# Patient Record
Sex: Male | Born: 1965
Health system: Southern US, Community
[De-identification: ages and names within clinical notes are randomized; demographics above are authoritative.]

## PROBLEM LIST (undated history)

## (undated) ENCOUNTER — Ambulatory Visit (HOSPITAL_COMMUNITY): Admission: EM | Discharge status: Absent without leave | Payer: Commercial Managed Care - PPO

## (undated) DIAGNOSIS — K6389 Other specified diseases of intestine: Secondary | ICD-10-CM

## (undated) DIAGNOSIS — M109 Gout, unspecified: Secondary | ICD-10-CM

## (undated) DIAGNOSIS — G629 Polyneuropathy, unspecified: Secondary | ICD-10-CM

## (undated) DIAGNOSIS — I10 Essential (primary) hypertension: Secondary | ICD-10-CM

## (undated) DIAGNOSIS — N183 Chronic kidney disease, stage 3 unspecified: Secondary | ICD-10-CM

## (undated) DIAGNOSIS — D649 Anemia, unspecified: Secondary | ICD-10-CM

## (undated) DIAGNOSIS — J189 Pneumonia, unspecified organism: Secondary | ICD-10-CM

## (undated) DIAGNOSIS — M1990 Unspecified osteoarthritis, unspecified site: Secondary | ICD-10-CM

## (undated) DIAGNOSIS — Z8619 Personal history of other infectious and parasitic diseases: Secondary | ICD-10-CM

## (undated) HISTORY — DX: Personal history of other infectious and parasitic diseases: Z86.19

## (undated) HISTORY — PX: OTHER SURGICAL HISTORY: SHX169

## (undated) HISTORY — PX: COLONOSCOPY: SHX174

## (undated) HISTORY — DX: Polyneuropathy, unspecified: G62.9

## (undated) HISTORY — DX: Chronic kidney disease, stage 3 unspecified: N18.30

## (undated) HISTORY — DX: Chronic kidney disease, stage 3 (moderate): N18.3

## (undated) HISTORY — DX: Anemia, unspecified: D64.9

## (undated) HISTORY — DX: Essential (primary) hypertension: I10

## (undated) HISTORY — DX: Other specified diseases of intestine: K63.89

---

## 1985-10-18 HISTORY — PX: TENDON REPAIR: SHX5111

## 2001-08-06 ENCOUNTER — Emergency Department (HOSPITAL_COMMUNITY): Admission: EM | Admit: 2001-08-06 | Discharge: 2001-08-06 | Payer: Self-pay | Admitting: Emergency Medicine

## 2001-08-06 ENCOUNTER — Encounter: Payer: Self-pay | Admitting: Emergency Medicine

## 2001-08-08 ENCOUNTER — Emergency Department (HOSPITAL_COMMUNITY): Admission: EM | Admit: 2001-08-08 | Discharge: 2001-08-08 | Payer: Self-pay | Admitting: Emergency Medicine

## 2001-08-09 ENCOUNTER — Emergency Department (HOSPITAL_COMMUNITY): Admission: EM | Admit: 2001-08-09 | Discharge: 2001-08-09 | Payer: Self-pay | Admitting: Emergency Medicine

## 2001-08-31 ENCOUNTER — Encounter: Payer: Self-pay | Admitting: Emergency Medicine

## 2001-08-31 ENCOUNTER — Inpatient Hospital Stay (HOSPITAL_COMMUNITY): Admission: EM | Admit: 2001-08-31 | Discharge: 2001-09-01 | Payer: Self-pay | Admitting: Emergency Medicine

## 2001-11-19 ENCOUNTER — Inpatient Hospital Stay (HOSPITAL_COMMUNITY): Admission: EM | Admit: 2001-11-19 | Discharge: 2001-11-24 | Payer: Self-pay | Admitting: Emergency Medicine

## 2001-11-19 ENCOUNTER — Encounter (INDEPENDENT_AMBULATORY_CARE_PROVIDER_SITE_OTHER): Payer: Self-pay | Admitting: *Deleted

## 2001-11-20 ENCOUNTER — Encounter: Payer: Self-pay | Admitting: Internal Medicine

## 2001-11-21 ENCOUNTER — Encounter: Payer: Self-pay | Admitting: Internal Medicine

## 2002-01-26 ENCOUNTER — Emergency Department (HOSPITAL_COMMUNITY): Admission: EM | Admit: 2002-01-26 | Discharge: 2002-01-26 | Payer: Self-pay | Admitting: Emergency Medicine

## 2002-01-26 ENCOUNTER — Encounter: Payer: Self-pay | Admitting: Emergency Medicine

## 2002-02-14 ENCOUNTER — Emergency Department (HOSPITAL_COMMUNITY): Admission: EM | Admit: 2002-02-14 | Discharge: 2002-02-14 | Payer: Self-pay | Admitting: Emergency Medicine

## 2002-02-21 ENCOUNTER — Observation Stay (HOSPITAL_COMMUNITY): Admission: EM | Admit: 2002-02-21 | Discharge: 2002-02-22 | Payer: Self-pay | Admitting: Emergency Medicine

## 2002-03-21 ENCOUNTER — Encounter: Admission: RE | Admit: 2002-03-21 | Discharge: 2002-03-21 | Payer: Self-pay | Admitting: Internal Medicine

## 2002-05-23 ENCOUNTER — Emergency Department (HOSPITAL_COMMUNITY): Admission: EM | Admit: 2002-05-23 | Discharge: 2002-05-23 | Payer: Self-pay | Admitting: Emergency Medicine

## 2002-09-14 ENCOUNTER — Emergency Department (HOSPITAL_COMMUNITY): Admission: EM | Admit: 2002-09-14 | Discharge: 2002-09-14 | Payer: Self-pay | Admitting: Emergency Medicine

## 2002-09-14 ENCOUNTER — Encounter: Payer: Self-pay | Admitting: Emergency Medicine

## 2002-10-05 ENCOUNTER — Encounter: Payer: Self-pay | Admitting: Emergency Medicine

## 2002-10-05 ENCOUNTER — Emergency Department (HOSPITAL_COMMUNITY): Admission: EM | Admit: 2002-10-05 | Discharge: 2002-10-05 | Payer: Self-pay

## 2002-10-07 ENCOUNTER — Emergency Department (HOSPITAL_COMMUNITY): Admission: EM | Admit: 2002-10-07 | Discharge: 2002-10-07 | Payer: Self-pay | Admitting: Emergency Medicine

## 2002-11-11 ENCOUNTER — Encounter: Payer: Self-pay | Admitting: *Deleted

## 2002-11-11 ENCOUNTER — Inpatient Hospital Stay (HOSPITAL_COMMUNITY): Admission: EM | Admit: 2002-11-11 | Discharge: 2002-11-14 | Payer: Self-pay

## 2002-11-12 ENCOUNTER — Encounter: Payer: Self-pay | Admitting: *Deleted

## 2002-11-14 ENCOUNTER — Encounter (INDEPENDENT_AMBULATORY_CARE_PROVIDER_SITE_OTHER): Payer: Self-pay | Admitting: Specialist

## 2003-09-13 ENCOUNTER — Emergency Department (HOSPITAL_COMMUNITY): Admission: EM | Admit: 2003-09-13 | Discharge: 2003-09-13 | Payer: Self-pay | Admitting: *Deleted

## 2003-09-17 ENCOUNTER — Emergency Department (HOSPITAL_COMMUNITY): Admission: AD | Admit: 2003-09-17 | Discharge: 2003-09-17 | Payer: Self-pay | Admitting: Family Medicine

## 2003-09-27 ENCOUNTER — Emergency Department (HOSPITAL_COMMUNITY): Admission: EM | Admit: 2003-09-27 | Discharge: 2003-09-27 | Payer: Self-pay | Admitting: Emergency Medicine

## 2003-10-25 ENCOUNTER — Emergency Department (HOSPITAL_COMMUNITY): Admission: AD | Admit: 2003-10-25 | Discharge: 2003-10-25 | Payer: Self-pay | Admitting: Family Medicine

## 2003-10-28 ENCOUNTER — Emergency Department (HOSPITAL_COMMUNITY): Admission: AD | Admit: 2003-10-28 | Discharge: 2003-10-28 | Payer: Self-pay | Admitting: Family Medicine

## 2004-01-20 ENCOUNTER — Emergency Department (HOSPITAL_COMMUNITY): Admission: AD | Admit: 2004-01-20 | Discharge: 2004-01-20 | Payer: Self-pay | Admitting: Family Medicine

## 2004-03-22 ENCOUNTER — Emergency Department (HOSPITAL_COMMUNITY): Admission: EM | Admit: 2004-03-22 | Discharge: 2004-03-22 | Payer: Self-pay | Admitting: Emergency Medicine

## 2004-03-23 ENCOUNTER — Emergency Department (HOSPITAL_COMMUNITY): Admission: EM | Admit: 2004-03-23 | Discharge: 2004-03-23 | Payer: Self-pay | Admitting: Family Medicine

## 2004-03-26 ENCOUNTER — Emergency Department (HOSPITAL_COMMUNITY): Admission: EM | Admit: 2004-03-26 | Discharge: 2004-03-26 | Payer: Self-pay | Admitting: Family Medicine

## 2004-03-27 ENCOUNTER — Emergency Department (HOSPITAL_COMMUNITY): Admission: EM | Admit: 2004-03-27 | Discharge: 2004-03-27 | Payer: Self-pay | Admitting: Emergency Medicine

## 2004-08-03 ENCOUNTER — Encounter: Admission: RE | Admit: 2004-08-03 | Discharge: 2004-08-03 | Payer: Self-pay | Admitting: Nephrology

## 2004-08-04 ENCOUNTER — Encounter (HOSPITAL_COMMUNITY): Admission: RE | Admit: 2004-08-04 | Discharge: 2004-08-13 | Payer: Self-pay | Admitting: Nephrology

## 2004-10-06 ENCOUNTER — Emergency Department (HOSPITAL_COMMUNITY): Admission: EM | Admit: 2004-10-06 | Discharge: 2004-10-06 | Payer: Self-pay | Admitting: Emergency Medicine

## 2004-10-20 ENCOUNTER — Emergency Department (HOSPITAL_COMMUNITY): Admission: EM | Admit: 2004-10-20 | Discharge: 2004-10-20 | Payer: Self-pay | Admitting: Family Medicine

## 2004-10-22 ENCOUNTER — Emergency Department (HOSPITAL_COMMUNITY): Admission: EM | Admit: 2004-10-22 | Discharge: 2004-10-22 | Payer: Self-pay | Admitting: *Deleted

## 2004-10-27 ENCOUNTER — Emergency Department (HOSPITAL_COMMUNITY): Admission: EM | Admit: 2004-10-27 | Discharge: 2004-10-27 | Payer: Self-pay | Admitting: Family Medicine

## 2004-10-27 ENCOUNTER — Ambulatory Visit (HOSPITAL_COMMUNITY): Admission: RE | Admit: 2004-10-27 | Discharge: 2004-10-27 | Payer: Self-pay | Admitting: Family Medicine

## 2004-11-01 ENCOUNTER — Emergency Department (HOSPITAL_COMMUNITY): Admission: EM | Admit: 2004-11-01 | Discharge: 2004-11-01 | Payer: Self-pay | Admitting: Family Medicine

## 2004-11-02 ENCOUNTER — Ambulatory Visit (HOSPITAL_COMMUNITY): Admission: RE | Admit: 2004-11-02 | Discharge: 2004-11-02 | Payer: Self-pay | Admitting: Family Medicine

## 2004-12-30 ENCOUNTER — Emergency Department (HOSPITAL_COMMUNITY): Admission: EM | Admit: 2004-12-30 | Discharge: 2004-12-30 | Payer: Self-pay | Admitting: Family Medicine

## 2005-03-02 ENCOUNTER — Emergency Department (HOSPITAL_COMMUNITY): Admission: EM | Admit: 2005-03-02 | Discharge: 2005-03-02 | Payer: Self-pay | Admitting: Internal Medicine

## 2005-04-15 ENCOUNTER — Emergency Department (HOSPITAL_COMMUNITY): Admission: EM | Admit: 2005-04-15 | Discharge: 2005-04-15 | Payer: Self-pay | Admitting: Family Medicine

## 2005-04-17 ENCOUNTER — Observation Stay (HOSPITAL_COMMUNITY): Admission: EM | Admit: 2005-04-17 | Discharge: 2005-04-18 | Payer: Self-pay | Admitting: Emergency Medicine

## 2005-07-01 ENCOUNTER — Emergency Department (HOSPITAL_COMMUNITY): Admission: EM | Admit: 2005-07-01 | Discharge: 2005-07-01 | Payer: Self-pay | Admitting: Family Medicine

## 2005-09-21 ENCOUNTER — Emergency Department (HOSPITAL_COMMUNITY): Admission: EM | Admit: 2005-09-21 | Discharge: 2005-09-21 | Payer: Self-pay | Admitting: Family Medicine

## 2005-10-07 ENCOUNTER — Emergency Department (HOSPITAL_COMMUNITY): Admission: EM | Admit: 2005-10-07 | Discharge: 2005-10-07 | Payer: Self-pay | Admitting: Emergency Medicine

## 2005-10-21 ENCOUNTER — Emergency Department (HOSPITAL_COMMUNITY): Admission: EM | Admit: 2005-10-21 | Discharge: 2005-10-21 | Payer: Self-pay | Admitting: Family Medicine

## 2005-11-25 ENCOUNTER — Emergency Department (HOSPITAL_COMMUNITY): Admission: EM | Admit: 2005-11-25 | Discharge: 2005-11-25 | Payer: Self-pay | Admitting: Family Medicine

## 2005-11-27 ENCOUNTER — Emergency Department (HOSPITAL_COMMUNITY): Admission: EM | Admit: 2005-11-27 | Discharge: 2005-11-27 | Payer: Self-pay | Admitting: Family Medicine

## 2005-12-26 ENCOUNTER — Emergency Department (HOSPITAL_COMMUNITY): Admission: EM | Admit: 2005-12-26 | Discharge: 2005-12-26 | Payer: Self-pay | Admitting: Family Medicine

## 2006-01-24 ENCOUNTER — Emergency Department (HOSPITAL_COMMUNITY): Admission: EM | Admit: 2006-01-24 | Discharge: 2006-01-24 | Payer: Self-pay | Admitting: Family Medicine

## 2006-02-23 ENCOUNTER — Emergency Department (HOSPITAL_COMMUNITY): Admission: EM | Admit: 2006-02-23 | Discharge: 2006-02-23 | Payer: Self-pay | Admitting: *Deleted

## 2006-08-31 ENCOUNTER — Emergency Department (HOSPITAL_COMMUNITY): Admission: EM | Admit: 2006-08-31 | Discharge: 2006-08-31 | Payer: Self-pay | Admitting: Emergency Medicine

## 2006-10-19 ENCOUNTER — Emergency Department (HOSPITAL_COMMUNITY): Admission: EM | Admit: 2006-10-19 | Discharge: 2006-10-19 | Payer: Self-pay | Admitting: Family Medicine

## 2006-10-28 ENCOUNTER — Emergency Department (HOSPITAL_COMMUNITY): Admission: EM | Admit: 2006-10-28 | Discharge: 2006-10-28 | Payer: Self-pay | Admitting: Emergency Medicine

## 2006-10-30 ENCOUNTER — Emergency Department (HOSPITAL_COMMUNITY): Admission: EM | Admit: 2006-10-30 | Discharge: 2006-10-30 | Payer: Self-pay | Admitting: Family Medicine

## 2007-01-03 ENCOUNTER — Emergency Department (HOSPITAL_COMMUNITY): Admission: EM | Admit: 2007-01-03 | Discharge: 2007-01-03 | Payer: Self-pay | Admitting: Emergency Medicine

## 2007-01-26 ENCOUNTER — Ambulatory Visit: Payer: Self-pay | Admitting: Internal Medicine

## 2007-02-13 ENCOUNTER — Ambulatory Visit: Payer: Self-pay | Admitting: Internal Medicine

## 2007-02-13 ENCOUNTER — Encounter (INDEPENDENT_AMBULATORY_CARE_PROVIDER_SITE_OTHER): Payer: Self-pay | Admitting: *Deleted

## 2007-02-13 DIAGNOSIS — K5190 Ulcerative colitis, unspecified, without complications: Secondary | ICD-10-CM | POA: Insufficient documentation

## 2007-02-13 DIAGNOSIS — K6389 Other specified diseases of intestine: Secondary | ICD-10-CM

## 2007-06-13 ENCOUNTER — Emergency Department (HOSPITAL_COMMUNITY): Admission: EM | Admit: 2007-06-13 | Discharge: 2007-06-13 | Payer: Self-pay | Admitting: Emergency Medicine

## 2007-06-30 ENCOUNTER — Emergency Department (HOSPITAL_COMMUNITY): Admission: EM | Admit: 2007-06-30 | Discharge: 2007-06-30 | Payer: Self-pay | Admitting: Emergency Medicine

## 2007-07-02 ENCOUNTER — Emergency Department (HOSPITAL_COMMUNITY): Admission: EM | Admit: 2007-07-02 | Discharge: 2007-07-03 | Payer: Self-pay | Admitting: *Deleted

## 2007-08-10 ENCOUNTER — Encounter (INDEPENDENT_AMBULATORY_CARE_PROVIDER_SITE_OTHER): Payer: Self-pay | Admitting: Family Medicine

## 2007-08-10 ENCOUNTER — Ambulatory Visit: Payer: Self-pay | Admitting: Surgery

## 2007-08-10 ENCOUNTER — Ambulatory Visit (HOSPITAL_COMMUNITY): Admission: RE | Admit: 2007-08-10 | Discharge: 2007-08-10 | Payer: Self-pay | Admitting: Family Medicine

## 2007-09-27 ENCOUNTER — Ambulatory Visit: Payer: Self-pay | Admitting: Internal Medicine

## 2007-09-27 LAB — CONVERTED CEMR LAB
Albumin: 2.9 g/dL — ABNORMAL LOW (ref 3.5–5.2)
Alkaline Phosphatase: 39 units/L (ref 39–117)
BUN: 9 mg/dL (ref 6–23)
Basophils Absolute: 0 10*3/uL (ref 0.0–0.1)
Creatinine, Ser: 1.7 mg/dL — ABNORMAL HIGH (ref 0.4–1.5)
Eosinophils Absolute: 0.7 10*3/uL — ABNORMAL HIGH (ref 0.0–0.6)
GFR calc non Af Amer: 47 mL/min
Hemoglobin: 11.6 g/dL — ABNORMAL LOW (ref 13.0–17.0)
MCHC: 34.7 g/dL (ref 30.0–36.0)
Monocytes Absolute: 0.9 10*3/uL — ABNORMAL HIGH (ref 0.2–0.7)
Monocytes Relative: 11.9 % — ABNORMAL HIGH (ref 3.0–11.0)
Potassium: 3.8 meq/L (ref 3.5–5.1)
RBC: 3.66 M/uL — ABNORMAL LOW (ref 4.22–5.81)
RDW: 15.2 % — ABNORMAL HIGH (ref 11.5–14.6)
Total Bilirubin: 0.9 mg/dL (ref 0.3–1.2)

## 2007-10-16 DIAGNOSIS — D509 Iron deficiency anemia, unspecified: Secondary | ICD-10-CM

## 2007-10-16 DIAGNOSIS — I10 Essential (primary) hypertension: Secondary | ICD-10-CM | POA: Insufficient documentation

## 2007-12-18 ENCOUNTER — Emergency Department (HOSPITAL_COMMUNITY): Admission: EM | Admit: 2007-12-18 | Discharge: 2007-12-18 | Payer: Self-pay | Admitting: Family Medicine

## 2007-12-29 ENCOUNTER — Emergency Department (HOSPITAL_COMMUNITY): Admission: EM | Admit: 2007-12-29 | Discharge: 2007-12-29 | Payer: Self-pay | Admitting: Family Medicine

## 2008-02-13 ENCOUNTER — Emergency Department (HOSPITAL_COMMUNITY): Admission: EM | Admit: 2008-02-13 | Discharge: 2008-02-13 | Payer: Self-pay | Admitting: Emergency Medicine

## 2008-10-12 ENCOUNTER — Emergency Department (HOSPITAL_COMMUNITY): Admission: EM | Admit: 2008-10-12 | Discharge: 2008-10-12 | Payer: Self-pay | Admitting: Family Medicine

## 2008-10-13 ENCOUNTER — Emergency Department (HOSPITAL_COMMUNITY): Admission: EM | Admit: 2008-10-13 | Discharge: 2008-10-13 | Payer: Self-pay | Admitting: Family Medicine

## 2008-11-06 ENCOUNTER — Emergency Department (HOSPITAL_COMMUNITY): Admission: EM | Admit: 2008-11-06 | Discharge: 2008-11-06 | Payer: Self-pay | Admitting: Emergency Medicine

## 2008-11-17 ENCOUNTER — Emergency Department (HOSPITAL_COMMUNITY): Admission: EM | Admit: 2008-11-17 | Discharge: 2008-11-17 | Payer: Self-pay | Admitting: Family Medicine

## 2009-01-19 ENCOUNTER — Emergency Department (HOSPITAL_COMMUNITY): Admission: EM | Admit: 2009-01-19 | Discharge: 2009-01-19 | Payer: Self-pay | Admitting: Family Medicine

## 2009-02-02 ENCOUNTER — Emergency Department (HOSPITAL_COMMUNITY): Admission: EM | Admit: 2009-02-02 | Discharge: 2009-02-02 | Payer: Self-pay | Admitting: Emergency Medicine

## 2009-07-16 ENCOUNTER — Emergency Department (HOSPITAL_COMMUNITY): Admission: EM | Admit: 2009-07-16 | Discharge: 2009-07-16 | Payer: Self-pay | Admitting: Family Medicine

## 2009-07-18 ENCOUNTER — Encounter: Admission: RE | Admit: 2009-07-18 | Discharge: 2009-07-18 | Payer: Self-pay | Admitting: Family Medicine

## 2009-08-22 ENCOUNTER — Encounter (INDEPENDENT_AMBULATORY_CARE_PROVIDER_SITE_OTHER): Payer: Self-pay | Admitting: Interventional Radiology

## 2009-08-22 ENCOUNTER — Ambulatory Visit (HOSPITAL_COMMUNITY): Admission: RE | Admit: 2009-08-22 | Discharge: 2009-08-22 | Payer: Self-pay | Admitting: Nephrology

## 2010-08-22 ENCOUNTER — Emergency Department (HOSPITAL_COMMUNITY): Admission: EM | Admit: 2010-08-22 | Discharge: 2010-08-22 | Payer: Self-pay | Admitting: Family Medicine

## 2010-11-14 ENCOUNTER — Emergency Department (HOSPITAL_COMMUNITY)
Admission: EM | Admit: 2010-11-14 | Discharge: 2010-11-14 | Payer: Self-pay | Source: Home / Self Care | Admitting: Emergency Medicine

## 2010-11-14 ENCOUNTER — Emergency Department (HOSPITAL_COMMUNITY)
Admission: EM | Admit: 2010-11-14 | Discharge: 2010-11-14 | Disposition: A | Discharge status: Other Acute Inpatient Hospital | Payer: Self-pay | Source: Home / Self Care | Admitting: Emergency Medicine

## 2010-11-14 LAB — CBC
HCT: 37.1 % — ABNORMAL LOW (ref 39.0–52.0)
HCT: 49.2 % (ref 39.0–52.0)
Hemoglobin: 12.4 g/dL — ABNORMAL LOW (ref 13.0–17.0)
Hemoglobin: 16.8 g/dL (ref 13.0–17.0)
MCH: 33 pg (ref 26.0–34.0)
MCHC: 33.4 g/dL (ref 30.0–36.0)
MCHC: 34.1 g/dL (ref 30.0–36.0)
MCV: 98.7 fL (ref 78.0–100.0)
MCV: 98.2 fL (ref 78.0–100.0)
RDW: 13.4 % (ref 11.5–15.5)
RDW: 13.7 % (ref 11.5–15.5)

## 2010-11-14 LAB — DIFFERENTIAL
Basophils Absolute: 0 10*3/uL (ref 0.0–0.1)
Basophils Relative: 0 % (ref 0–1)
Eosinophils Absolute: 0.4 10*3/uL (ref 0.0–0.7)
Eosinophils Relative: 10 % — ABNORMAL HIGH (ref 0–5)
Lymphocytes Relative: 9 % — ABNORMAL LOW (ref 12–46)
Lymphs Abs: 0.8 10*3/uL (ref 0.7–4.0)
Lymphs Abs: 0.5 10*3/uL — ABNORMAL LOW (ref 0.7–4.0)
Monocytes Absolute: 0.8 10*3/uL (ref 0.1–1.0)
Monocytes Relative: 9 % (ref 3–12)
Neutro Abs: 6 10*3/uL (ref 1.7–7.7)
Neutro Abs: 3.9 10*3/uL (ref 1.7–7.7)

## 2010-11-14 LAB — POCT I-STAT, CHEM 8
BUN: 10 mg/dL (ref 6–23)
Chloride: 107 mEq/L (ref 96–112)
Creatinine, Ser: 2 mg/dL — ABNORMAL HIGH (ref 0.4–1.5)
Glucose, Bld: 94 mg/dL (ref 70–99)
Hemoglobin: 13.3 g/dL (ref 13.0–17.0)
Potassium: 3.9 mEq/L (ref 3.5–5.1)

## 2010-11-14 LAB — URINALYSIS, ROUTINE W REFLEX MICROSCOPIC
Ketones, ur: NEGATIVE mg/dL
Protein, ur: >300 mg/dL — AB
Urine Glucose, Fasting: NEGATIVE mg/dL
pH: 6 (ref 5.0–8.0)

## 2010-11-14 LAB — OCCULT BLOOD, POC DEVICE: Fecal Occult Bld: NEGATIVE

## 2010-11-14 LAB — COMPREHENSIVE METABOLIC PANEL
ALT: 17 U/L (ref 0–53)
Albumin: 2.9 g/dL — ABNORMAL LOW (ref 3.5–5.2)
Alkaline Phosphatase: 40 U/L (ref 39–117)
BUN: 10 mg/dL (ref 6–23)
Chloride: 108 mEq/L (ref 96–112)
Potassium: 4.1 mEq/L (ref 3.5–5.1)
Sodium: 139 mEq/L (ref 135–145)
Total Bilirubin: 0.8 mg/dL (ref 0.3–1.2)
Total Protein: 6.2 g/dL (ref 6.0–8.3)

## 2010-11-14 LAB — PROTIME-INR: INR: 0.98 (ref 0.00–1.49)

## 2010-11-14 LAB — APTT: aPTT: 30 seconds (ref 24–37)

## 2010-11-16 NOTE — Assessment & Plan Note (Signed)
[place]                                ON-CALL NOTE  NAME:Colin [name], Colin [name]                          MRN:          [id number] DATE:11/14/2010                            DOB:          09-Apr-1966   This is a patient of Dr. Carol Ada.  Colin [name] called.  He has constellation of GI symptoms including what sounds like minor rectal bleeding, vague abdominal pain, nausea, anorexia.  He was seen in Dr. Ulyses Amor office few days ago.  Dr. Benson Norway did not feel that this is a flare of his ulcerative colitis which he is known to have.  He thought it was more likely anorectal issue and put him on some suppositories.  The patient is calling today explaining more of these other systemic symptoms.  He has had no fevers or chills.  I really could not tell what is going on over the phone.  I recommended he present to urgent clinic or the emergency room.    Milus Banister, MD    DPJ/MedQ  DD: 11/14/2010  DT: 11/15/2010  Job #: [id number]  cc:   Tory Emerald. Benson Norway, MD

## 2011-01-20 LAB — CBC
HCT: 39 % (ref 39.0–52.0)
Hemoglobin: 13.3 g/dL (ref 13.0–17.0)
MCV: 98.1 fL (ref 78.0–100.0)
Platelets: 344 10*3/uL (ref 150–400)
RBC: 3.98 MIL/uL — ABNORMAL LOW (ref 4.22–5.81)
WBC: 9.1 10*3/uL (ref 4.0–10.5)

## 2011-01-20 LAB — APTT: aPTT: 30 seconds (ref 24–37)

## 2011-02-01 LAB — DIFFERENTIAL
Basophils Relative: 0 % (ref 0–1)
Eosinophils Absolute: 0.6 10*3/uL (ref 0.0–0.7)
Lymphs Abs: 1 10*3/uL (ref 0.7–4.0)
Neutro Abs: 8.1 10*3/uL — ABNORMAL HIGH (ref 1.7–7.7)
Neutrophils Relative %: 77 % (ref 43–77)

## 2011-02-01 LAB — POCT I-STAT, CHEM 8
BUN: 19 mg/dL (ref 6–23)
Chloride: 106 mEq/L (ref 96–112)
Creatinine, Ser: 1.9 mg/dL — ABNORMAL HIGH (ref 0.4–1.5)
Potassium: 3.6 mEq/L (ref 3.5–5.1)
Sodium: 141 mEq/L (ref 135–145)
TCO2: 27 mmol/L (ref 0–100)

## 2011-02-01 LAB — POCT CARDIAC MARKERS
CKMB, poc: <1 ng/mL — ABNORMAL LOW (ref 1.0–8.0)
Myoglobin, poc: 83.2 ng/mL (ref 12–200)

## 2011-02-01 LAB — CBC
MCV: 96.5 fL (ref 78.0–100.0)
Platelets: 333 10*3/uL (ref 150–400)
WBC: 10.6 10*3/uL — ABNORMAL HIGH (ref 4.0–10.5)

## 2011-02-05 ENCOUNTER — Ambulatory Visit: Payer: Self-pay | Admitting: Cardiovascular Disease

## 2011-02-09 ENCOUNTER — Encounter: Payer: Self-pay | Admitting: Cardiovascular Disease

## 2011-02-09 ENCOUNTER — Ambulatory Visit (INDEPENDENT_AMBULATORY_CARE_PROVIDER_SITE_OTHER): Payer: Commercial Managed Care - PPO | Admitting: Cardiovascular Disease

## 2011-02-09 VITALS — BP 146/82 | HR 70 | Ht 65.0 in | Wt 224.8 lb

## 2011-02-09 DIAGNOSIS — N189 Chronic kidney disease, unspecified: Secondary | ICD-10-CM

## 2011-02-09 DIAGNOSIS — R609 Edema, unspecified: Secondary | ICD-10-CM

## 2011-02-09 DIAGNOSIS — I10 Essential (primary) hypertension: Secondary | ICD-10-CM

## 2011-02-09 MED ORDER — ISOSORBIDE MONONITRATE ER 30 MG PO TB24: 30.0000 mg | ORAL_TABLET | Freq: Every day | ORAL | Status: DC

## 2011-02-09 NOTE — Assessment & Plan Note (Signed)
Edema is trace bilaterally and likely secondary to venous insufficiency. We have recommended leg elevation, TED hose.

## 2011-02-09 NOTE — Assessment & Plan Note (Signed)
We did discuss the various treatment options for his blood pressure appeared we have suggested that he purchase a blood pressure cuff as this will be very useful in the years to come in various options for his low pressure are limited given his underlying renal insufficiency and problems in the past with clonidine. Has an [place] American gentleman, he may respond better to nitrates and vasodilators. We have suggested he try isosorbide mononitrate 30 mg daily. If he is unable to tolerate this, we could possibly try hydralazine though this would need to be dosed frequently.   We have suggested that he stay on his current medication regimen at this time. It is uncertain if he has had a cough with ACE inhibitor. We would not want to add an ACE inhibitor in the setting of being on an ARB. We do not want to add Norvasc or costal channel blocker given that he does have lower extremity edema which is likely secondary to venous insufficiency.

## 2011-02-09 NOTE — Assessment & Plan Note (Signed)
I suspect that his renal insufficiency is secondary to chronic lung standing hypertension. I stressed to him the importance of good blood pressure control. I urged him to purchase a blood pressure cuff to help [place] with close monitoring of his pressures through the medication titration period.

## 2011-02-09 NOTE — Progress Notes (Signed)
   Patient ID: Colin [name], male    DOB: [birth year], 45 y.o.   MRN: [id number]  HPI Comments: [name] is a very pleasant 45 year old gentleman, patient of Dr.Ehinger, who presents by referral for recent problems with hypertension. He has a history of chronic renal insufficiency, ulcerative colitis dating back to 2000.  He reports that overall he feels well. He has had difficulty getting his blood pressure under control. He does not check it at home as he does not have a blood pressure cuff at this time. He did have a blood pressure cuff in the past though when he moved, his blood pressure cuff was misplaced. He does take his medications on a regular basis. He has had problems with several medications and side effects. He reports that clonidine caused dry mouth and fatigue. He does not have particular side effects with his current medications. He does take Lasix on a daily basis for his renal insufficiency and has been doing so for several years.  He is a nonsmoker, nondiabetic. Father died early from kidney related complications and hemodialysis.  EKG shows normal sinus rhythm with rate 70 beats per minute with no significant ST or T wave changes     Review of Systems  Constitutional: Negative.   HENT: Negative.   Eyes: Negative.   Respiratory: Negative.   Cardiovascular: Negative.   Gastrointestinal: Negative.   Musculoskeletal: Negative.   Skin: Negative.   Neurological: Negative.   Hematological: Negative.   Psychiatric/Behavioral: Negative.   All other systems reviewed and are negative.   BP 146/82  Pulse 70  Ht 5' 5"  (1.651 m)  Wt 224 lb 12.8 oz (101.969 kg)  BMI 37.41 kg/m2   Physical Exam  Nursing note and vitals reviewed. Constitutional: He is oriented to person, place, and time. He appears well-developed and well-nourished.       Mild obesity.  HENT:  Head: Normocephalic.  Nose: Nose normal.  Mouth/Throat: Oropharynx is clear and moist.  Eyes: Conjunctivae are  normal. Pupils are equal, round, and reactive to light.  Neck: Normal range of motion. Neck supple. No JVD present.  Cardiovascular: Normal rate, regular rhythm, S1 normal, S2 normal, normal heart sounds and intact distal pulses.  Exam reveals no gallop and no friction rub.   No murmur heard. Pulmonary/Chest: Effort normal and breath sounds normal. No respiratory distress. He has no wheezes. He has no rales. He exhibits no tenderness.  Abdominal: Soft. Bowel sounds are normal. He exhibits no distension. There is no tenderness.  Musculoskeletal: Normal range of motion. He exhibits no edema and no tenderness.  Lymphadenopathy:    He has no cervical adenopathy.  Neurological: He is alert and oriented to person, place, and time. Coordination normal.  Skin: Skin is warm and dry. No rash noted. No erythema.  Psychiatric: He has a normal mood and affect. His behavior is normal. Judgment and thought content normal.           Assessment and Plan

## 2011-02-09 NOTE — Patient Instructions (Addendum)
You are doing well. We will start imdur 30 mg daily. Please call us if you have new issues that need to be addressed before your next appt.  We will call you for a follow up Appt. In 1 month

## 2011-03-02 NOTE — Letter (Signed)
November 14, 2007    Mr. Colin [name]  [address]  [place], [place]:  [name], [name]  MRN:  [id number]  /  DOB:  [birth year]   Dear [name],   It has become clear, due to persistent medical noncompliance, that we  can no longer serve as your physicians. You have demonstrated an  inability to follow up with your medical appointments as recommended on  numerous occasions.  You were even counseled directly regarding the  issue but failed to heed this advise. Please understand that you have a  serious chronic medical condition, ulcerative colitis, which requires  regular medical supervision. Though we are available to provide care for  you for emergency situations over the next 30 days from the date of this  letter, it is your responsibility to secure a new physician immediately,  as no services will be provided thereafter.   We certainly wish you well in the future.  If you have any questions  regarding this notice, please contact our Chiropodist, Ms.  Anastasia Pall.    Sincerely,      [name]. Henrene Pastor, MD  Electronically Signed    JNP/MedQ  DD: 11/14/2007  DT: 11/15/2007  Job #: [id number]   CC:   Modena Jansky. Marisue Humble, M.D.  Anastasia Pall, GI Administrator

## 2011-03-02 NOTE — Assessment & Plan Note (Signed)
[facility] OFFICE NOTE   NAME:BELLChannon, Ambrosini                          MRN:          [id number]  DATE:09/27/2007                            DOB:          13-Jul-1966    HISTORY:  Mr. Capili called the office complaining with what sounded like  a flare of his ulcerative colitis. He was asked to come into. He was  last evaluated February 13, 2007 when he underwent colonoscopy for his  ulcerative colitis. He was found to have pancolitis. Multiple biopsies  revealed chronic active colitis. No evidence of dysplasia. He was in the  midst of a prednisone taper. He was to continue on Asacol and followup  in 4 weeks. Unfortunately the patient was noncompliant and did not  followup. He tells me that he tapered off prednisone over the course of  a month or so. In the summer, he discontinued Asacol and was feeling  well. Later in the summer, he noticed some blood in his stool and  resumed Asacol 1.6 grams t.i.d. Over the past week, he has had increased  frequency of loose stools up to 10 per day. He has seen blood and mucous  as well as cramps. No weight loss, nausea, vomiting or fevers. He states  his symptoms are quite typical of a flare.   CURRENT MEDICATIONS:  1. Asacol 1.6 grams t.i.d.  2. Furosemide unspecified dosage daily.  3. Colchicine unspecified dosage daily.   PHYSICAL EXAMINATION:  Well-appearing male in no acute distress.  Blood pressure 154/90, heart rate 88, weight 224 pounds.  HEENT:  Sclera anicteric, conjunctiva pink. Oral mucosa is intact.  LUNGS:  Clear.  HEART:  Regular.  ABDOMEN:  Soft without tenderness, mass or hernia. Good bowel sounds  heard.   IMPRESSION:  1. History of pan-ulcerative colitis. Currently with a typical flare.      This despite Asacol 4.8 grams daily for several months.  2. Medical noncompliance.   RECOMMENDATIONS:  1. Laboratories today including CBC and comprehensive metabolic  panel.  2. Continue Asacol.  3. Initiate prednisone 40 mg daily for 2 weeks and then 30 mg daily      for 2 weeks and then 20 mg daily.  4. Discussion today regarding ulcerative colitis as well as importance      of medical compliance. As well, he was referred to our website to      review ulcerative colitis in detail.  5. Strictly advised for office followup in 4 weeks. Otherwise resume      general medical care with Dr. Marisue Humble.     [name]. Henrene Pastor, MD  Electronically Signed    JNP/MedQ  DD: 09/27/2007  DT: 09/28/2007  Job #: [id number]   cc:   Modena Jansky. Marisue Humble, M.D.

## 2011-03-05 NOTE — Discharge Summary (Signed)
[place]  Patient:    Colin [name], Colin [name] Visit Number: [id number] MRN: [id number]          Service Type: MED Location: [phone] Attending Physician:  Phifer, Stefan Church Dictated by:   Thressa Sheller, M.D. Admit Date:  11/19/2001 Discharge Date: 11/24/2001   CC:         Mora Oletta Lamas, M.D.                           Discharge Summary  DISCHARGE DIAGNOSES: 1. Ulcerative colitis flare secondary to running out of medications. 2. Clostridium difficile infection. 3. Acute thrombocytopenia, possibly secondary to heparin. 4. Hypertension.  DISCHARGE MEDICATIONS: 1. Flagyl 500 mg t.i.d. x10 days. 2. Asacol 1600 mg t.i.d. 3. Prednisone taper. 4. Hydrochlorothiazide 25 mg q.d. 5. Enalapril 5 mg q.d.  FOLLOW-UP:  With Dr. Laurence Spates at [facility] in two to three weeks and follow up with Dr. Noah Delaine on February 17 at 2 p.m. in outpatient clinic.  PROCEDURES:  On February 4, a flexible sigmoidoscopy was done.  CONSULTATIONS:  James L. Oletta Lamas, M.D., GI.  HISTORY OF PRESENT ILLNESS:  Colin [name] is 45 year old African-American male with a past medical history of ulcerative colitis.  He ran out of his Asacol four days prior to admission and subsequently began to have bright red blood per rectum and lower abdominal pain.  He has also experienced mild nausea and vomiting and appetite loss.  This has happened before in November 2002.  He got prednisone at an acute care clinic two weeks ago that has not relieved the pain that he was having then.  His last colonoscopy was one year ago.  MEDICATIONS: 1. Lasix. 2. Norvasc. 3. Asacol.  ALLERGIES:  SULFA DRUGS cause nausea.  SOCIAL HISTORY:  He denies any tobacco, alcohol, or drug use.  PHYSICAL EXAMINATION:  VITAL SIGNS:  Temperature 99.5, blood pressure 172/98, pulse 96, respirations 18.  Saturation 92% on room air.  HEENT:  Normocephalic,  atraumatic.  Pupils equal, round and reactive to light and accommodation.  Extraocular movements are intact.  Oropharynx, nasopharynx without erythema or swelling.  NECK:  Supple without lymphadenopathy.  CHEST:  Clear to auscultation bilaterally.  CARDIAC:  Regular rate and rhythm, no murmurs, rubs, or gallops.  ABDOMEN:  Soft, tender on the left greater than the right.  Normoactive bowel sounds.  No rebound or guarding.  EXTREMITIES:  No edema, cyanosis, or clubbing.  ADMISSION LABORATORY DATA:  Sodium 138, potassium 3.4, chloride 105, CO2 24, BUN 8, creatinine 1.5, glucose 106, calcium 9.0.  Bilirubin 1.3, alkaline phosphatase 54, AST 22, ALT 21, protein 7.5, albumin 3.3.  WBC 11.0, hemoglobin 11.3, MCV 91, platelets 272.  Lipase 18.  Magnesium 1.6.  HOSPITAL COURSE:  #1 -  ULCERATIVE COLITIS FLARE:  Colin [name] admits that he ran out of his Asacol due to financial issues and that his ulcerative colitis got much worse as soon as he stopped taking it.  On admission he was made NPO.  He was given mesalamine per rectum once a day.  This enema was allowed to sit for eight hours each time it was given.  A GI consult was obtained to view the colon.  Flexible sigmoidoscopy was done and biopsies were taken.  Colin [name] C. difficile toxin lab came back positive while the biopsies were awaited and  at that point, oral metronidazole was started as it is difficult to visually tell the difference between ulcerative colitis and pseudomembranous colitis. Colin [name] pathology did end up showing inflammatory bowel disease with a surprising component of ischemic disease, for which we have no explanation. He was sent out on Asacol again, and Dr. Oletta Lamas will help arrange for him to get some drug samples from his office.  #2 -  CLOSTRIDIUM DIFFICILE INFECTION:  It is not certain whether this infection contributed to the problems Ms. Law was having, but we chose to continue treating the infection for a  full course of antibiotics.  He will receive two weeks worth of Flagyl.  #3 -  ACUTE THROMBOCYTOPENIA:  Colin [name] platelets dropped as low as 54,000 during his hospitalization.  It is not certain what the cause was, although heparin-associated antibodies are the most likely cause.  Mr. Mabile drugs were reviewed to see if any of them might be causing his thrombocytopenia, and it was decided that none of them were.  There was also the possibility of low-grade DIC, although he never seemed sick enough for this to be the reason.  DISCHARGE LABORATORY DATA:  WBC 20.6, hemoglobin 10.1, MCV 92, platelets 128. Sodium 141, potassium 3.4, chloride 109, CO2 26, glucose 134, BUN 14, creatinine 1.5.  Calcium 8.6. Dictated by:   Thressa Sheller, M.D. Attending Physician:  Phifer, Stefan Church DD:  01/14/02 TD:  01/15/02 Job: [id number]

## 2011-03-05 NOTE — Consult Note (Signed)
[place]  Patient:    Colin [name], Colin [name] Visit Number: [id number] MRN: [id number]          Service Type: MED Location: [phone] Attending Physician:  Phifer, Stefan Church Dictated by:   Verdell Carmine Proc. Date: 11/21/01 Admit Date:  11/19/2001   CC:         Evette Doffing, M.D.   Consultation Report  CHIEF COMPLAINT:  Abdominal pain.  ATTENDING PHYSICIAN:  Evette Doffing, M.D.  HISTORY OF PRESENT ILLNESS:  This is a 45 year old African-American male with a history of ulcerative colitis who presented to [facility] ED on February 2 with a three-day history of abdominal pain and bright red blood per rectum. He had been having frequent bouts of diarrhea for approximately a month now and had had intermittent flares of ulcerative colitis, the last one being approximately a month ago when he was seen at an urgent care center and treated with steroids.  He states that, over the past three days, he has had worsening abdominal pain associated with nausea, vomiting, subjective fevers and chills, as well as headaches.  He denies any arthralgias or skin rashes. He was first diagnosed with ulcerative colitis in [place] two to three years ago.  His last colonoscopy was in May 2002 in [place].  He had apparently been out of his medications for two to three days.  He was admitted to Dr. Wyline Mood service secondary to abdominal pain and a presumed ulcerative colitis flare.  Asacol was restarted as well as a single dose of Solu-Medrol given without relief of symptoms.  We were asked to consult for further management of care.  PAST MEDICAL HISTORY: 1. Ulcerative colitis. 2. Hypertension. 3. Pneumonia November 2002.  MEDICATIONS PRIOR TO ADMISSION: 1. Asacol 1600 mg t.i.d. to q.i.d. 2. Norvasc 10 mg p.o. q.d. 3. Lasix q.d.  PAST SURGICAL HISTORY:  None.  ALLERGIES:  SULFA causes nausea.  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  He denies any tobacco or drug use, does occasionally drink alcohol on a social basis.  FAMILY HISTORY:  No history of autoimmune diseases, no history of colon cancer, peptic ulcer disease, irritable bowel syndrome.  REVIEW OF SYSTEMS:  Prior to admission, his Review of Systems was otherwise negative except as in the HPI; however, this a.m., he complained of shortness of breath to the primary care team which he states is resolving.  PHYSICAL EXAMINATION:  VITAL SIGNS:  T-max 100.9 since admission, current temperature 98.4.  Pulse 92, respirations 20, blood pressure 162/99, O2 saturation 92% on room air.  GENERAL:  He is in moderate distress complaining of abdominal pain and nausea. He is alert and oriented x 4.  HEENT:  No scleral icterus.  Oropharynx shows moist mucous membranes.  NECK:  No JVD noted.  CARDIOVASCULAR:  Tachycardic in the 100s.  Regular rate and rhythm with no murmurs.  LUNGS:  He has faint crackles at the bases.  He does have slight increased work of breathing.  ABDOMEN:  Diffusely tender with voluntary guarding, no rebound.  Mild to moderate distention noted.  Positive bowel sounds.  RECTAL:  Heme positive on admission.  EXTREMITIES:  No cyanosis, clubbing, or edema.  He has good peripheral perfusion.  CURRENT LABORATORY DATA:  CBC shows a white count of 9.3.  Hemoglobin is 10.9, down from 11.3 on admission.  Platelets are currently 65, down from  272 on admission.  CMP on admission notable for potassium 2.4 and creatinine 1.5. His total bilirubin was 1.3, alkaline phosphatase 54, SGOT 22, SGPT 21, albumin 3.3, amylase 28, and lipase 12.  Blood cultures have been negative to date.  CT of the abdomen shows no bowel wall thickening, diffuse mesenteric fatty strands which are a nonspecific finding.  Chest x-ray from this morning is currently pending.  ASSESSMENT/PLAN:  This  is a 45 year old male with abdominal pain and bright red blood per rectum presumed to be secondary to an ulcerative colitis flare with no relief of symptoms after a single dose of Solu-Medrol and Asacol therapy.  After discussion with Dr. Oletta Lamas, will take patient down for flexible sigmoidoscopy to determine current status of patients disease.  Will consider restarting patient on daily steroid therapy based on our findings at flexible sigmoidoscopy.  Certainly, this would improve the ulcerative colitis as well as his current thrombocytopenia.  We will continue to control pain and given Phenergan for nausea.  Would make patient n.p.o. until further notice. Would monitor abdominal exam closely in the light of worsening symptoms. Dictated by:   Verdell Carmine Attending Physician:  Phifer, Stefan Church DD:  11/21/01 TD:  11/22/01 Job: [id number]

## 2011-03-05 NOTE — Discharge Summary (Signed)
[name], [name] NO.:  [id number]   MEDICAL RECORD NO.:  [id number]                   PATIENT TYPE:  INP   LOCATION:  3037                                 FACILITY:  [facility]   PHYSICIAN:  Gwendolyn Grant, M.D. [facility]          DATE OF BIRTH:  01/16/1966   DATE OF ADMISSION:  11/11/2002  DATE OF DISCHARGE:  11/14/2002                                 DISCHARGE SUMMARY   DISCHARGE DIAGNOSES:  1. Transient global amnesia.  2. Moderately active ulcerative colitis.  3. Iron deficiency anemia.  4. Chronic renal insufficiency.   BRIEF ADMISSION HISTORY:  Mr. Colin [name] is a 45 year old African-American male  who presented with amnesia.  Reportedly the patient got up around 2:15 a.m.  to prepare for work.  The wife heard him downstairs talking to himself.  She  went downstairs and found him.  He could not remember what he was supposed  to be doing.  He kept asking her the same questions over and over, for  example what day is it.  He was mildly diaphoretic but cool to the touch.  The patient denied any headache or head trauma, and no recent alcohol or  drug use.   PAST MEDICAL HISTORY:  1. Ulcerative colitis.  2. Hypertension.   HOSPITAL COURSE:  Problem 1.  Neurologic.  The patient presented with  transient global amnesia.  Head CT was negative.  The patient was admitted  for an MRI and EEG.  MRI was also unremarkable.  EEG was done today,  11/13/02.  The results are still pending, but reportedly appears normal.  His  symptoms have resolved, and no further neurologic workup is planned.  Problem 2.  GI.  The patient has a history of ulcerative colitis, and  apparently has been poorly compliant with meds in the past.  He has also  been passing blood, and is anemic.  Iron studies were consistent with iron  deficiency, and he was transfused two units.  GI was asked to see the  patient.  The patient was seen by Dr. Henrene Pastor on 11/13/02.  He suspected this  was an  active ulcerative colitis, but recommended colonoscopy with biopsy,  as well as to check stool for C. difficile.  Stool for C. difficile was  negative.  Colonoscopy was performed on 11/14/02 revealing moderately active  ulcerative colitis from the hepatic flexure to rectum.  Dr. Henrene Pastor made a  recommendation to continue the Asacol and add prednisone, as well.  The  patient is to follow up with Dr. Henrene Pastor in the office.  Problem 3.  Iron deficiency anemia.  The patient is hemodynamically stable  following two units of packed red blood cells.  Problem 4.  Hyperglycemia.  This was transient, and I suspect secondary to D-  5 and his IV fluids.  His hemoglobin A1C was normal.  Problem 5.  Renal insufficiency.  The patient is  on HCTZ at home.  This  medication was held, and his renal insufficiency improved.  He does also  have some proteinuria, and a 24-hour urine is pending.  Problem 6.  ID.  The patient was empirically started on Tequin for  bronchitis.  Will have him complete a full 10-day course.   LABS AT DISCHARGE:  Sedimentation rate was 62, retic count 2.3%.  B12 366;  complement C3, 136;  complement C4, 26; total iron less than 10.  Hemoglobin  A1C 4.4%.  Hemoglobin 8.5.  The followup hemoglobin is still pending.  BUN  was 16, creatinine 1.7.  Total cholesterol 116.    MEDICATIONS AT DISCHARGE:  1. Prednisone 40 mg daily.  2. Asacol 400 mg four pills t.i.d.  3. Iron sulfate 325 mg t.i.d.  4. Norvasc 10 mg daily.  5. HCTZ 25 mg daily.  6. Tequin 400 mg daily for 6 days.   FOLLOWUP:  With Dr. __________ in 2-3 weeks, and Dr. Scarlette Shorts on Tuesday,  11/27/02, at 1:30 p.m.     [name], P.A. [facility]                  Gwendolyn Grant, M.D. [facility]    LC/MEDQ  D:  11/14/2002  T:  11/14/2002  Job:  [id number]   cc:   [name]. [name]., M.D. [facility]  [address]  [place] [address]  Fax: 1   [name], M.D.  [address] [place] [address]  Fax:  [phone]   Dr. Marcello Fennel?

## 2011-03-05 NOTE — Consult Note (Signed)
Colin [name], TOMEO NO.:  [id number]   MEDICAL RECORD NO.:  [id number]                   PATIENT TYPE:  INP   LOCATION:  3037                                 FACILITY:  [place]   PHYSICIAN:  Heinz Knuckles. Norins, M.D. [facility]         DATE OF BIRTH:  1966-05-31   DATE OF CONSULTATION:  11/12/2002  DATE OF DISCHARGE:                                   CONSULTATION   HISTORY:  I was requested by Dr. Ashley Murrain to see and evaluate this  patient for anemia and dyspnea.   IMPRESSION:  1. Global transient amnesia, resolved.  2. Chronic anemia with a hemoglobin of 9.0 grams December, 2003 to 8.5 grams     at this evaluation.  3. History of inflammatory bowel disease by endoscopy with a known history     of poor compliance.  4. History of dyspnea.   RECOMMENDATIONS:  1. Laboratory, reticulocyte count, iron, TIBC, B12, C-ANCA, ESR, hemoglobin     A1c, 24 hour urine for creatinine clearance and total protein.  2. Tequin 400 mg q. 24 for bronchitis.  3. Transfuse two units of packed red blood cells because of symptomatic     dyspnea in a setting of anemia.   HISTORY OF PRESENT ILLNESS:  Colin [name] is a 45 year old married black male  who has a history of endoscopically diagnosed inflammatory bowel disease -  ulcerative colitis with a history of poor medication compliance in the past.  The patient has been followed for a normocytic anemia present for greater  than 12 months.  His hemoglobin was 9 grams in December, 2003 and was 8.7 at  admission on November 11, 2002, down to 8.5 on the morning of November 12, 2002.  Leading cause is probably inflammatory bowel disease with poor  control but other causes need to be evaluated.   The patient has a history of respiratory infections in the past.  At  admission on January 25 he was noted to have mild dyspnea which he now  complains of as increasing.  He also is tachypneic and has a low grade  fever.   Laboratory  review also reveals elevated serum glucose of 122 as well as  proteinuria on dipstick urinalysis.   PAST MEDICAL HISTORY:  Surgical - none.  Medical - usual childhood diseases,  history of hypertension medically managed, inflammatory bowel disease with  ulcerative colitis, anemia.   CURRENT MEDICATIONS:  1. Aspirin.  2. Norvasc 10 mg daily.  3. Hydrochlorothiazide 25 mg daily.  4. Tylenol for fever.  5. Vicodin for pain.  6. Phenergan for nausea.   FAMILY HISTORY:  Positive for hypertension, negative for coronary artery  disease, negative for colitis, negative for colon cancer.   SOCIAL HISTORY:  The patient is an [employer] veteran.  He is married.  He  has one child.  He is a Administrator.  Habits:  Tobacco - none.  Alcohol -  none.   REVIEW OF SYSTEMS:  Negative except for the history of present illness and  chief complaint.   PHYSICAL EXAMINATION:  VITALS:  Temperature maximum 101.1, blood pressure  148/80, heart rate 117, respirations 20.  GENERAL:  This is a well-nourished, well-developed black male in no acute  distress with no increased work in breathing.  HEENT:  Normocephalic, atraumatic.  Conjunctiva and sclera were clear.  Oropharynx without lesions.  NECK:  Supple, without thyromegaly.  Nodes:  No adenopathy was noted in the  cervical or supraclavicular regions.  CHEST:  The patient has good breath sounds, no rales, no wheezes are  appreciated.  CARDIOVASCULAR:  2+ radial pulse, he had a regular rate and rhythm without  murmurs, rubs or gallops.  ABDOMEN:  Soft with positive bowel sounds, no organo splenomegaly, no  guarding or rebound.  RECTAL:  Deferred.  EXTREMITIES:  Unremarkable.  NEUROLOGIC:  Per Dr. Rosiland Oz.   LABORATORY DATA:  A urinalysis on January 25 was greater than 300 mg per  deciliter of protein, 15 ketones, trace leukocyte esterase, 0 to 2 WBC's,  hyaline cast, urine epithelial cells were noted.  Urine and blood drug  screens were negative.   Alcohol level was negative.  Chemistries - on  November 11, 2002 was a sodium of 136, potassium 4.0, glucose 122, BUN 15,  creatinine 1.7.  CK was 113, troponin was 0.01.  CBC - white count of 5,400,  hemoglobin 8.7, hematocrit 27.7, platelet count 342,000.  Differential 85%  segs, 80% lymphs, 60% monos.  Follow up CBC January 26 with a hemoglobin of  8.5.  PA and lateral chest x-ray is pending, repeat C-met is pending.   ASSESSMENT:  1. ANEMIA.  Patient with longstanding normocytic anemia now worse.  The     patient does report he is having active blood in his stool and has a     history of inflammatory bowel disease/ulcerative colitis which is the     most likely cause.  Cannot rule out vasculitis or other associated     diseases as cause for his anemia.  Patient also with a history of     increasing dyspnea, possibly related to his anemia.  Plan:  Iron studies,     reticulocyte count as noted above.  Will type and cross and transfuse two     units because the patient is symptomatic with shortness of breath with     his anemia.  This was discussed with the patient.  He understands the     risks and benefits and is willing to proceed.  2. PULMONARY.  The patient with fever, tachypnea and slightly purulent     sputum.  Chest x-ray is pending.  Suspect bronchitis.  However, given the     patient's four to six week history of dyspnea need to look for other     causes including anemia, vasculitis etc.  Plan:  Will start Tequin 400 mg     daily, will obtain pulmonary function testing with pre and post     bronchodilators, will order a C-ANCA.  3. RENAL.  The patient's creatinine is 1.7, high end of normal.  He does     have significant proteinuria on dipstick urinalysis.  With concerns as     noted above for possible vasculitis or Wagner's granulomatosis will     obtain a 24 hour urine for protein and creatinine clearance, will obtain    a  C-ANCA as noted, will get complement levels.  4.  HYPERGLYCEMIA.  The patient has a serum glucose of 122.  Previous     laboratory indicated a serum glucose as high as 145.  Given the patient's     proteinuria will need to rule out any underlying occult diabetes.  Plan:     Hemoglobin A1c, no added sugar diet.  5. GI.  Patient with known inflammatory bowel disease with ulcerative     colitis.  Would recommend continuing the patient's home medications using     Asacol.   In summary, patient with medical problems as outlined above with multiple  symptoms that are clearly explained.  Of note his neurologic symptom of  global amnesia has resolved.  Would be happy to accept this patient in  transfer to our service it appears that he is neurologically stable.                                               Heinz Knuckles Norins, M.D. [facility]    MEN/MEDQ  D:  11/12/2002  T:  11/12/2002  Job:  [id number]   cc:   Michaelle Copas, M.D.  [facility]

## 2011-03-05 NOTE — Assessment & Plan Note (Signed)
[place] OFFICE NOTE   NAME:Colin [name], Colin [name]                          MRN:          [id number]  DATE:01/26/2007                            DOB:          April 23, 1966    REFERRING PHYSICIAN:  Modena Jansky. Marisue Humble, M.D.   REASON FOR CONSULTATION:  Presumed ulcerative colitis flare.   HISTORY:  This is a 45 year old African-American male with a history of  hypertension and ulcerative colitis, who is referred through the  courtesy of Dr. Marisue Humble regarding probable ulcerative colitis flare.  The patient was diagnosed with ulcerative colitis years ago at an  outside facility.  He first came to medical attention of this group in  January of 2004 when he was found to have iron deficiency anemia and  active colitis symptoms.  Colonoscopy at that time revealed moderate  ulcerative colitis involving the distal 3/4 of the colon from the  hepatic flexure to rectum.  He was placed on Asacol 1.6 g p.o. t.i.d.  and started on prednisone 40 mg daily.  He was last evaluated in this  office Mar 05, 2003, at which time he was on Asacol as a single agent,  after being weaned off of steroids.  He was asymptomatic, and provided  literature on his disease.  He was instructed to follow up in 6 months,  but failed to do so.  He acknowledges that he was aware of this  instruction, but just decided not to follow up.  At some point  thereafter, he came off of his Asacol by his own report.  He was off the  drug for about 2 years and was doing well without any significant  colitis symptoms.  He tells me he got back on the drug over the past  year to year-and-a-half.  He began to develop problems in January with  increased frequency of bowel movements, bleeding, and mucus.  Also, some  cramping abdominal discomfort.  This Friday, he had some nausea and  vomiting as well.  No fevers or weight loss.  He was seen by Dr.  Marisue Humble, who on January 23, 2007 obtained blood work.  His CBC revealed a  mild anemia with a hemoglobin of 11.4, MCV was normal at 90.2, platelets  slightly elevated at 418,000.  White blood cell count normal at 8.5.  Basic metabolic panel was unremarkable.  His creatinine was 1.5.  He is  now referred.   PAST MEDICAL HISTORY:  1. Ulcerative colitis.  2. Hypertension.   PAST SURGICAL HISTORY:  Tendon repair of the left index finger in 1987.  That is his only surgical history.   ALLERGIES:  SULFA.   CURRENT MEDICATIONS:  1. Asacol 1.6 g p.o. t.i.d.  2. Furosemide unspecified dosage daily.  3. Metoprolol unspecified dosage.  4. Emethacin unspecified dosage.   FAMILY HISTORY:  Cousin with Crohn's disease.  No colon cancer.   SOCIAL HISTORY:  The patient is married with 2 sons.  Lives with his  wife and son.  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  He does not smoke.  He has 2 to 3 bottles of wine per  month.   REVIEW OF SYSTEMS:  Per diagnostic evaluation form.   PHYSICAL EXAM:  A well-appearing male in no acute distress.  Blood pressure 144/98, heart rate 80, weight 211.2 pounds.  He is 5 feet  5 inches in height.  HEENT:  Sclerae anicteric.  Conjunctivae pink.  Oral mucosa is intact.  No adenopathy.  LUNGS:  Clear.  HEART:  Regular.  ABDOMEN:  Soft without tenderness, mass, or hernia.  RECTAL:  Deferred.  EXTREMITIES:  Without edema.   IMPRESSION:  Long-standing pan-ulcerative colitis, currently with mild  flare of disease (clinically) over the past 3 to 4 months.   RECOMMENDATIONS:  1. Continue Asacol without change.  2. Initiate prednisone 40 mg daily.  3. Multivitamin with iron.  4. Schedule colonoscopy with biopsies.  The nature of the procedure,      as well as risks, benefits, and alternatives have been reviewed.      He understood and agreed to proceed.  5. Medical compliance strictly stressed.     [name]. Henrene Pastor, MD  Electronically Signed    JNP/MedQ  DD:  01/26/2007  DT: 01/26/2007  Job #: [id number]   cc:   Modena Jansky. Marisue Humble, M.D.

## 2011-03-05 NOTE — Discharge Summary (Signed)
[place]  Patient:    Colin [name], Colin [name] Visit Number: [id number] MRN: [id number]          Service Type: Attending:  C. Milta Deiters, M.D. Dictated by:   Berkley Harvey, M.D. Proc. Date: 09/01/01 Adm. Date:  08/31/01 Disc. Date: 09/01/01   CC:         Internal Medicine Department  C. Milta Deiters, M.D.  Pricilla Riffle. Dagoberto Ligas., M.D. [facility]   Discharge Summary  Account [id number]  DISCHARGE DIAGNOSES: 1. Ulcerative colitis. 2. Questionable Clostridium difficile. 3. Hypertension. 4. Anemia. 5. Proteinuria.  CHIEF COMPLAINT:  Abdominal pain.  HISTORY OF PRESENT ILLNESS:  This is a 45 year old black male with a known history of ulcerative colitis for the last 2-3 years.  He had stopped taking his medication, Asacol, secondary to recently moving to this area, about 2 months ago.  At this time, the patient does not have a primary care, or gastroenterologist.  The patient is now presenting to the emergency department with 24-hours of severe left lower quadrant abdominal pain, nausea, vomiting, and bloody stools for the last 1-1/2 weeks.  The patient has also noticed increased bowel movements over the last 3-4 weeks having 2-3 bowel movements a day.  The patient reports that he has been hospitalized in the past for similar symptoms.  Of note, the patient was recently given a prescription for Augmentin for treatment of a pneumonia which he finished about 10 days prior to this admission.  He reports the abdominal pain is now diffuse, but still worse in the left lower quadrant.  He reports that these symptoms are worsened by eating.  He has done nothing that helps relieve this pain.  Defecation does not increase or worsen pain.  The patient is now being admitted for further workup and management.  PAST MEDICAL HISTORY: 1. Ulcerative colitis diagnosed 2-3 years ago. 2. Hypertension. 3. Recent lingular pneumonia status post Augmentin, last dose  October 22.  PAST SURGICAL HISTORY:  He has no past surgeries.  CURRENT MEDICATIONS: 1. Lasix 10 mg once a day. 2. Norvasc 10 mg once a day, 3. Asacol 400 mg 3 pills p.o. t.i.d.  ALLERGIES:  SULFA causes nausea and vomiting.  FAMILY HISTORY:  Mother has hypertension.  Father has hypertension and an MI in his 79s.  The patient has no history of GI cancer or inflammatory bowel disease in his family.  SOCIAL HISTORY:  The patient is married.  He has moved to the area.  He formerly lived in [place], [place].  He denies any tobacco use or IV drugs.  He does drink socially.  The patient is currently unemployed.  The patient is a Educational psychologist.  He has a 12th grade education.  REVIEW OF SYSTEMS:  The patient denies any fever, chills, weight change, since having pneumonia.  He does admit to a cough with productive white sputum.  PHYSICAL EXAMINATION:  VITAL SIGNS:  Temperature is 99.4, pulse is 72, respiratory rate 20, blood pressure 182/101.  99% on room air.  GENERAL:  He is an ill-appearing black male in moderate distress.  HEENT:  Normocephalic, atraumatic.  Pupils equal, round, and reactive to light and accommodation.  Extraocular muscles are intact.  Conjunctivae are pink. TMs are clear, but there are no bruits.  NECK:  Appreciate neck is supple.  LUNGS:  Clear to auscultation bilaterally.  There are no wheezes or crackles.  CARDIAC:  Regular rate and rhythm with a normal S1 and S2 heart  sounds.  There is a 2/6 systolic ejection murmur at the left sternal border.  ABDOMEN:  Diffuse tenderness.  Bowel sounds are hyperactive.  His abdomen is distended.  There is no rebound and there is no guarding.  RECTAL:  Nontender.  Normal tone.  There is a small amount of heme positive stool.  EXTREMITIES:  There is no clubbing, cyanosis, or edema.  NEUROLOGIC:  Await, alert, and oriented x4.  The exam is nonfocal.  EXTREMITIES:  No clubbing, cyanosis, or  edema.  LABORATORY DATA:  13.6 for white count, hemoglobin is 12.9; hematocrit 37.4; platelets 238.  MCV is 93.5, 88% PMNs.  Sodium 136, potassium 3.3, chloride 102, bicarb 28, BUN 12, creatinine 1.4, glucose 126, lipase 26, albumin 3.1, ESR 6.2.  UA is negative for ketones, positive proteinuria greater than 300 urine sodium is 197, wbcs 0-2, there are 2 rbcs, a few bacteria, positive for hyaline casts.  X-ray of thick walled colon with question of ______ colitis versus ulcerative colitis.  CAT scan of the abdomen revealed thickened colon walls with ______ colitis, question ulcerative colitis.  ASSESSMENT:  This is a 45 year old African-American male with a known history of ulcerative colitis who has been off his medications for several weeks with the recent use of antibiotics for a pneumonia.  He is now presenting with abdominal pain associated with bloody bowel movements, nausea, and vomiting. At this time, the differential for the patients symptoms are a flare-up of his ulcerative colitis since he is off his medications versus C. difficile secondary to antibiotic use.  It appears more likely that this would be his ulcerative colitis especially with the bloody bowel movements and being off his medications; and the symptoms being very similar to past hospitalizations for flares.  The patient has had recent hospitalization in the last year to two secondary to a failure to be able to be able to afford his medications. With regards to C. difficile, this is possible secondary to the patients use of Augmentin for pneumonia 1-2 weeks ago; however, normally C. difficile does not cause blood bowel movements.  1. GI:  As stated above the differential is between a flare of the patients     ulcerative colitis versus C. difficile colitis from antibiotic use for    pneumonia.  A GI consult was obtained from Dr. Fuller Plan, who recommended    starting patient back on his Asacol dose covering for C.  difficile with    Flagyl, and giving the patients steroids.  So the patient was restarted on    Asacol at 1600 mg 3 times a day, he was started on Solu-Medrol 80 mg once a    day, and Flagyl 250 mg 4 times a day.  The patients nausea was treated    with Phenergan.  The patient underwent CT for further evaluation which    confirmed appeared to be a flare of his ulcerative colitis.  It also showed    question of air in the bowel wall.  This could correlate with ischemia;    however, at this time the patients symptoms and exam are very benign and    make ischemia very unlikely.  The patient will need to have a follow up CAT    scan for reevaluation of these findings.  At the time of discharge the    patient has benign abdominal exam, is tolerating p.o. and is afebrile.    The patient is no longer having blood bowel movements at this time.  The    patients hematocrit has remained stable.  The patient will be discharged    on a prednisone taper over the next month.  He will continue on Flagyl for    10 days and will remain on Asacol 1200 mg three times a day. 2. Hypertension.  The patient will continue on Lasix and Norvasc.  The patient    will be followed in the outpatient clinic for further adjustment of    antihypertensive meds. 3. Proteinuria, the patient was noted to have greater than 300 mg of protein    in his urine.  The patient was started on a 24-hour urine collection for    further evaluation.  At this time, this could just be an acute reaction to    his illness.  The patient will need further evaluation as an outpatient.    The patient should have his UA repeated when he follows up in the internal    medicine outpatient clinic in 1-2 weeks. 4. Diet.  The patient should maintain a low-residue diet.  The patient is    provided with a handout on this type of diet. 5. Social.  The patient is unemployed and has found it difficult, in the past    to obtain his medications.  At this time,  we will use hospital funds to    provide patient with 3-days worth of medications.  The patient said that he    will be able to afford 1-2 weeks of medication on his own.  Have already    started the process of contacting Idelle Crouch in the internal medicine    clinic to set up drug company supplementation for his Asacol.  The patient    will call Ms. West early next week to provide appropriate information to    start this process.  FOLLOWUP:  The patient will follow up in 1-2 weeks with Dr. Rockne Menghini in the internal medicine clinic.  DISCHARGE MEDICATIONS: 1. Prednisone taper over 1 month starting at 60 mg. 2. Norvasc 10 mg once a day. 3. Lasix 10 mg once a day. 4. Asacol 1200 mg 3 times a day. 5. Flagyl 500 mg t.i.d. x10 days. Dictated by:   Berkley Harvey, M.D. Attending:  C. Milta Deiters, M.D. DD:  09/01/01 TD:  09/01/01 Job: [id number]

## 2011-03-05 NOTE — Consult Note (Signed)
NAME:  Colin [name], DOMMER NO.:  [id number]   MEDICAL RECORD NO.:  [id number]                   PATIENT TYPE:  EMS   LOCATION:  [facility]                                 FACILITY:  [place]   PHYSICIAN:  Gwendolyn Grant, M.D. [facility]          DATE OF BIRTH:  03-26-1966   DATE OF CONSULTATION:  DATE OF DISCHARGE:                                   CONSULTATION   HISTORY OF PRESENT ILLNESS:  The patient is a 45 year old African-American  male who presents with a several day history of headache, sore throat, ear  ache, stomach ache. He states that the pain was worse last evening,  prompting his emergency room evaluation. He has not had any relief with over-  the-counter Alka-Seltzer Cold and Flu or Thera-Flu.  He states he developed  a nonproductive cough last evening. He denies any fever or chills, nausea or  vomiting or diarrhea.   The patient was  evaluated by the emergency room physician, who felt that he  could be discharged home. However, the patient continued to complain that he  was  too weak to go home. Therefore we were asked to see the patient in the  emergency room. On my examination the patient was sleeping soundly. He  appeared quite comfortable after receiving morphine for his pain.   ALLERGIES:  SULFA.   MEDICATIONS:  1. Asacol.  2. Hydrochlorothiazide.   PAST MEDICAL HISTORY:  1. Ulcerative colitis diagnosed in 1999. Last colonoscopy was in February     2003.  2. Hypertension.   SOCIAL HISTORY:  He is married with one child. He is a Administrator. He has  no history of  tobacco or alcohol.   FAMILY HISTORY:  Mother living at 47 with hypertension. Father living at  67  with hypertension.   REVIEW OF SYSTEMS:  Negative for diabetes, peptic ulcer disease,  gastroesophageal reflux disease, asthma, thyroid disease or any previous  surgery. He denies any specific right upper quadrant pain, no post prandial  nausea or vomiting. He does have some  constipation. He denies any melena,  hematochezia or coffee ground emesis.   PHYSICAL EXAMINATION:  VITAL SIGNS:  Temperature 100.7, blood pressure  148/98, pulse 100, O2 saturations 94% on room air.  GENERAL:  This  is a well developed, well nourished African-American male in  no acute distress.  HEENT:  Normocephalic, atraumatic. Oropharynx clear without exudate or  erythema. Tympanic membranes were clear, pearly gray without bulging.  NECK:  Without adenopathy, JVD or carotid bruits.  LUNGS:  Clear without rales, rhonchi or wheezes.  CARDIOVASCULAR:  Regular without murmurs, rubs, gallops.  ABDOMEN:  Firm and protuberant. Bowel sounds present, Nontender. There was  no rebound or guarding.  EXTREMITIES:  Without edema or cyanosis.  NEUROLOGIC:  He was lethargic but no other focal deficits were noted.   LABORATORY DATA:  Urinalysis was negative. White count 8, hemoglobin  9,  hematocrit 37.3, with 89% neutrophils. BMET was normal except an elevated  total bilirubin of 2.1. SGOT was elevated at 77, lipase was normal at 21. An  acute abdominal series showed no obstruction and no infiltrates. Abdominal  ultrasound showed slight thickening of the gallbladder wall but no stones or  free fluid.   IMPRESSION:  1. Infectious disease. The patient has a low grade fever with a left shift.     Although we suspect this is probably a viral illness, probably an upper     respiratory infection. There is no clear evidence of any bacterial     infection at this time. Again there was no infiltrate or pneumonia noted     on his chest x-ray.  2. Gastrointestinal.  He does have a mildly elevated SGOT and total     bilirubin, although his abdominal ultrasound is unremarkable. I did     review the patient's chart from May 2003, and his liver function tests     were normal at that time. This should be further evaluated in the     outpatient setting.  3. Normocytic anemia.  His hemoglobin was 9, hematocrit  27.3. I did note     that in May 2003 his hemoglobin was 10.2 and again this should be further     evaluated as an outpatient.   PLAN:  The patient was evaluated by Dr. Asa Lente who agreed that he could be  discharged to home and he was encouraged to continue his fluid intake and  use nonsteroidals and Tylenol as needed for pain. Again she agreed with  outpatient followup with Dr. Lelon Huh regarding his anemia and elevated liver  function tests. He has also been instructed to return to the emergency  department if he develops worsening fever or chills or progressive nausea or  vomiting, diarrhea or if symptoms last greater than 14 days.     [name], P.A. [facility]                  Gwendolyn Grant, M.D. [facility]    LC/MEDQ  D:  10/05/2002  T:  10/05/2002  Job:  [id number]   cc:   Dr. Eda Paschal [facility]

## 2011-03-05 NOTE — Procedures (Signed)
. [facility]  Patient:    Colin [name], Colin [name] Visit Number: [id number] MRN: [id number]          Service Type: MED Location: [id number] Attending Physician:  Phifer, Stefan Church Dictated by:   Joyice Faster. Oletta Lamas, M.D. Proc. Date: 11/21/01 Admit Date:  11/19/2001   CC:         Evette Doffing, M.D.   Procedure Report  DATE OF BIRTH:  [birth year]  PROCEDURE:  Sigmoidoscopy and biopsy.  MEDICATIONS:  The patient received no IV conscious sedation for this procedure.  He had received some pain medications on the floor, and seemed somewhat somnolent.  SCOPE:  Pediatric Olympus video colonoscope.  INDICATIONS FOR PROCEDURE:  History of ulcerative colitis with bloody diarrhea and abdominal pain.  DESCRIPTION OF PROCEDURE:  The procedure had been explained to the patient and his wife, and consent obtained from his wife.  With the patient in the left lateral decubitus position, digital examination was performed.  The scope was inserted.  This was done in the unprepped state.  There was a large amount of liquid stool in the colon.  The mucosa was diffusely granular and friable without gross ulcerations.  We advanced up to 40 cm.  The mucosa had this appearance at least that far up.  Multiple biopsies were obtained.  Stool samples were obtained and sent for C. diff toxin.  Culture, including enterotoxin E. coli and white blood cells smear.  ASSESSMENT:  Acute colitis to 40 cm.  Endoscopic appearance most consistent with ulcerative colitis.  PLAN:  We will empirically begin IV Solu-Medrol, keep n.p.o., we will check pathology results, and stabilize his electrolytes. Dictated by:   Joyice Faster. Oletta Lamas, M.D. Attending Physician:  Phifer, Stefan Church DD:  11/21/01 TD:  11/22/01 Job: [id number]

## 2011-03-09 ENCOUNTER — Ambulatory Visit: Payer: Commercial Managed Care - PPO | Admitting: Cardiovascular Disease

## 2011-03-22 ENCOUNTER — Ambulatory Visit: Payer: Commercial Managed Care - PPO | Admitting: Cardiovascular Disease

## 2011-04-01 ENCOUNTER — Encounter: Payer: Self-pay | Admitting: Cardiovascular Disease

## 2011-04-12 ENCOUNTER — Ambulatory Visit: Payer: Commercial Managed Care - PPO | Admitting: Cardiovascular Disease

## 2011-04-22 ENCOUNTER — Ambulatory Visit: Payer: Commercial Managed Care - PPO | Admitting: Cardiovascular Disease

## 2011-04-29 ENCOUNTER — Encounter: Payer: Self-pay | Admitting: Cardiovascular Disease

## 2011-05-03 ENCOUNTER — Ambulatory Visit (INDEPENDENT_AMBULATORY_CARE_PROVIDER_SITE_OTHER): Payer: Commercial Managed Care - PPO | Admitting: Cardiovascular Disease

## 2011-05-03 ENCOUNTER — Encounter: Payer: Self-pay | Admitting: Cardiovascular Disease

## 2011-05-03 DIAGNOSIS — I10 Essential (primary) hypertension: Secondary | ICD-10-CM

## 2011-05-03 DIAGNOSIS — N189 Chronic kidney disease, unspecified: Secondary | ICD-10-CM

## 2011-05-03 DIAGNOSIS — E669 Obesity, unspecified: Secondary | ICD-10-CM

## 2011-05-03 DIAGNOSIS — R609 Edema, unspecified: Secondary | ICD-10-CM

## 2011-05-03 MED ORDER — ISOSORBIDE MONONITRATE ER 60 MG PO TB24: 60.0000 mg | ORAL_TABLET | Freq: Two times a day (BID) | ORAL | Status: DC

## 2011-05-03 NOTE — Assessment & Plan Note (Signed)
Currently on Lasix. I have asked him to minimize his fluid intake and salt intake.

## 2011-05-03 NOTE — Assessment & Plan Note (Signed)
History of chronic renal insufficiency. Also has family history of renal insufficiency. It'll be important for [place] to manage his blood pressure quickly.

## 2011-05-03 NOTE — Assessment & Plan Note (Addendum)
We will increase his isosorbide to 60 mg daily. He did not have significant headache on this medication. If his blood pressure continues to be elevated, we'll increase the isosorbide to 60 mg b.i.d. Or alternatively we could at hydralazine 50 mg t.i.d..  He had side effects on clonidine. Calcium channel blockers have not been added secondary to lower extreme edema. He is already on a diuretic. Continue losartan and atenolol.

## 2011-05-03 NOTE — Progress Notes (Signed)
Patient ID: Colin [name], male    DOB: 1966/03/12, 45 y.o.   MRN: [id number]  HPI Comments: Colin [name] is a very pleasant 45 year old gentleman, patient of Dr.Ehinger, who presents by referral for recent problems with hypertension. He has a history of chronic renal insufficiency, ulcerative colitis dating back to 2000.  He has been tolerating his medications well. His blood pressure cuff broke 3 weeks ago. He feels his pressure continues to be elevated on the isosorbide which was added on his last visit. Otherwise no other complaints. He does continue to have lower extremity edema. He does drink a significant amount of fluid.   He is a nonsmoker, nondiabetic. Father died early from kidney related complications and hemodialysis.  EKG shows normal sinus rhythm with rate 70 beats per minute with no significant ST or T wave changes    Outpatient Encounter Prescriptions as of 05/03/2011  Medication Sig Dispense Refill  . allopurinol (ZYLOPRIM) 300 MG tablet Take 300 mg by mouth daily.        Marland Kitchen atenolol (TENORMIN) 100 MG tablet Take 100 mg by mouth daily.        . colchicine 0.6 MG tablet Take 0.6 mg by mouth daily.        . furosemide (LASIX) 40 MG tablet Take 40 mg by mouth daily.        Marland Kitchen losartan (COZAAR) 100 MG tablet Take 100 mg by mouth daily.        Marland Kitchen lubiprostone (AMITIZA) 24 MCG capsule Take 24 mcg by mouth 2 (two) times daily with a meal.        .  isosorbide mononitrate (IMDUR) 30 MG 24 hr tablet Take 1 tablet (30 mg total) by mouth daily.  30 tablet  6     Review of Systems  Constitutional: Negative.   HENT: Negative.   Eyes: Negative.   Respiratory: Negative.   Cardiovascular: Positive for leg swelling.  Gastrointestinal: Negative.   Musculoskeletal: Negative.   Skin: Negative.   Neurological: Negative.   Hematological: Negative.   Psychiatric/Behavioral: Negative.   All other systems reviewed and are negative.    BP 145/110  Pulse 69  Ht 5' 3"  (1.6 m)  Wt 229 lb  (103.874 kg)  BMI 40.57 kg/m2   Physical Exam  Nursing note and vitals reviewed. Constitutional: He is oriented to person, place, and time. He appears well-developed and well-nourished.  HENT:  Head: Normocephalic.  Nose: Nose normal.  Mouth/Throat: Oropharynx is clear and moist.  Eyes: Conjunctivae are normal. Pupils are equal, round, and reactive to light.  Neck: Normal range of motion. Neck supple. No JVD present.  Cardiovascular: Normal rate, regular rhythm, S1 normal, S2 normal, normal heart sounds and intact distal pulses.  Exam reveals no gallop and no friction rub.   No murmur heard.      Trace bilateral lower extremity edema to the mid shins  Pulmonary/Chest: Effort normal and breath sounds normal. No respiratory distress. He has no wheezes. He has no rales. He exhibits no tenderness.  Abdominal: Soft. Bowel sounds are normal. He exhibits no distension. There is no tenderness.  Musculoskeletal: Normal range of motion. He exhibits no edema and no tenderness.  Lymphadenopathy:    He has no cervical adenopathy.  Neurological: He is alert and oriented to person, place, and time. Coordination normal.  Skin: Skin is warm and dry. No rash noted. No erythema.  Psychiatric: He has a normal mood and affect. His behavior is normal. Judgment and  thought content normal.           Assessment and Plan

## 2011-05-03 NOTE — Patient Instructions (Addendum)
You are doing well. Please increase the isosorbide to 60 mg a day. If your blood pressure continues to be elevated (bottom number greater than 100), then take isosorbide 60 mg twice a day Please call us if you have new issues that need to be addressed before your next appt.  We will call you for a follow up Appt. In 3 months

## 2011-12-17 ENCOUNTER — Other Ambulatory Visit: Payer: Self-pay | Admitting: Cardiovascular Disease

## 2011-12-17 ENCOUNTER — Other Ambulatory Visit: Payer: Self-pay

## 2011-12-17 DIAGNOSIS — I10 Essential (primary) hypertension: Secondary | ICD-10-CM

## 2011-12-17 MED ORDER — ISOSORBIDE MONONITRATE ER 60 MG PO TB24: 60.0000 mg | ORAL_TABLET | Freq: Two times a day (BID) | ORAL | Status: DC

## 2012-06-23 ENCOUNTER — Other Ambulatory Visit: Payer: Self-pay | Admitting: Cardiovascular Disease

## 2013-05-15 ENCOUNTER — Other Ambulatory Visit: Payer: Self-pay | Admitting: Cardiovascular Disease

## 2013-05-15 NOTE — Telephone Encounter (Signed)
LMOM for patient to call to schedule an appointment with Dr. Rockey Situ to have isosorbide refilled.

## 2013-05-15 NOTE — Telephone Encounter (Signed)
LMOM

## 2014-01-03 ENCOUNTER — Encounter (INDEPENDENT_AMBULATORY_CARE_PROVIDER_SITE_OTHER): Payer: Self-pay

## 2014-01-03 ENCOUNTER — Ambulatory Visit (INDEPENDENT_AMBULATORY_CARE_PROVIDER_SITE_OTHER): Payer: Commercial Managed Care - PPO | Admitting: Physician Assistant

## 2014-01-03 ENCOUNTER — Encounter: Payer: Self-pay | Admitting: Physician Assistant

## 2014-01-03 ENCOUNTER — Ambulatory Visit: Payer: Commercial Managed Care - PPO | Admitting: Cardiovascular Disease

## 2014-01-03 VITALS — BP 145/101 | HR 72 | Ht 64.0 in | Wt 239.5 lb

## 2014-01-03 DIAGNOSIS — R0609 Other forms of dyspnea: Secondary | ICD-10-CM

## 2014-01-03 DIAGNOSIS — R0989 Other specified symptoms and signs involving the circulatory and respiratory systems: Secondary | ICD-10-CM

## 2014-01-03 DIAGNOSIS — N189 Chronic kidney disease, unspecified: Secondary | ICD-10-CM

## 2014-01-03 DIAGNOSIS — I10 Essential (primary) hypertension: Secondary | ICD-10-CM

## 2014-01-03 DIAGNOSIS — R0600 Dyspnea, unspecified: Secondary | ICD-10-CM

## 2014-01-03 DIAGNOSIS — R609 Edema, unspecified: Secondary | ICD-10-CM

## 2014-01-03 MED ORDER — ISOSORBIDE MONONITRATE ER 60 MG PO TB24: 60.0000 mg | ORAL_TABLET | Freq: Two times a day (BID) | ORAL | Status: DC

## 2014-01-03 MED ORDER — HYDRALAZINE HCL 50 MG PO TABS: 50.0000 mg | ORAL_TABLET | Freq: Two times a day (BID) | ORAL | Status: DC

## 2014-01-03 NOTE — Progress Notes (Signed)
Patient ID: Colin [name], male   DOB: Aug 20, 1966, 48 y.o.   MRN: [id number]   Date:  01/03/2014   ID:  Colin [name], DOB August 11, 1966, MRN [id number]  PCP:  Simona Huh, MD  Primary Cardiologist:  Johnny Bridge, MD   History of Present Illness:  Colin [name] is a 48 y.o. male PMHx s/f CKD (stage III), hypertension, obesity and gout who presents today for annual follow-up.  He was last seen by Dr. Rockey Situ in 2012. No tobacco abuse or DM2. Father w/ ESRD, on hemodialysis. Passed from kidney complications. He was followed for HTN. BP was well-controlled on Losartan, Imdur and atenolol at time. He ran out of Imdur and follows up today for refills.  He reports gaining 20-30 lbs over the past several months which he attributes to eating poorly (fast food, fried foods). He does eat a lot of salt. He endorses ankle, pedal and hand edema recently. His outpatient Lasix was increased transiently with improvement. He does note reduced functional capacity and dyspnea on exertion which he attributes to his recent weight gain. He denies chest pain, PND. He takes a few minutes to catch his breath and get comfortable when he lays down. Again, he relates this to weight gain. No illicit drug use. He did use pre-workout supplements containing creatine 1 month ago. No NSAID use. He does snore, no prior OSA. No thyroid problems.   Labwork @ PCP's office 10/31/13:  Glu 106 BUN 34 Cr 2.09 GFR 34 Sodium 138 Chloride 104 CO2 31 Anion gap 8.1 Calcium 9.8  EKG: NSR, 72 bpm, isolated 17m ST depression in III, otherwise no ST/T changes  Wt Readings from Last 3 Encounters:  01/03/14 239 lb 8 oz (108.636 kg)  05/03/11 229 lb (103.874 kg)  02/09/11 224 lb 12.8 oz (101.969 kg)     Past Medical History  Diagnosis Date  . History of chicken pox   . Mumps   . [place] Hypertension   . Anemia     iron deficiency  . Melanosis coli   . CKD (chronic kidney disease) stage  3, GFR 30-59 ml/min     Current Outpatient Prescriptions  Medication Sig Dispense Refill  . allopurinol (ZYLOPRIM) 300 MG tablet Take 300 mg by mouth daily.        .Marland Kitchenatenolol (TENORMIN) 100 MG tablet Take 100 mg by mouth daily.        . colchicine 0.6 MG tablet Take 0.6 mg by mouth daily as needed.       . furosemide (LASIX) 40 MG tablet Take 40 mg by mouth daily.        . isosorbide mononitrate (IMDUR) 60 MG 24 hr tablet TAKE 1 TABLET TWICE DAILY  60 tablet  6  . losartan (COZAAR) 100 MG tablet Take 100 mg by mouth daily.         No current facility-administered medications for this visit.    Allergies:   No Known Allergies  Social History:  The patient  reports that he has never smoked. He has never used smokeless tobacco. He reports that he drinks alcohol. He reports that he does not use illicit drugs.   Family History:  Family History  Problem Relation Age of Onset  . Hypertension Father     Review of Systems: General: negative for chills, fever, night sweats or weight changes.  Cardiovascular: positive for DOE, edema, negative for chest pain, orthopnea, palpitations, paroxysmal  nocturnal dyspnea Dermatological: bilaterally leg swelling, negative for rash Respiratory: negative for cough or wheezing Urologic: negative for hematuria Abdominal: negative for nausea, vomiting, diarrhea, bright red blood per rectum, melena, or hematemesis Neurologic: negative for visual changes, syncope, or dizziness All other systems reviewed and are otherwise negative except as noted above.  PHYSICAL EXAM: VS:  BP 145/101  Pulse 72  Ht 5' 4"  (1.626 m)  Wt 239 lb 8 oz (108.636 kg)  BMI 41.09 kg/m2  Well nourished, well developed, in no acute distress HEENT: normal, PERRL Neck: no JVD or bruits Cardiac:  normal S1, S2; RRR; no murmur or gallops Lungs:  clear to auscultation bilaterally, no wheezing, rhonchi or rales Abd: soft, nontender, no hepatomegaly, normoactive BS x 4 quads Ext: 1+  bilateral pretibial edema bilaterally, cyanosis or clubbing Skin: warm and dry, cap refill < 2 sec Neuro:  CNs 2-12 intact, no focal abnormalities noted Musculoskeletal: strength and tone appropriate for age  Psych: normal affect

## 2014-01-03 NOTE — Assessment & Plan Note (Signed)
BP 145/101 today. Hold Losartan given CKD. Will start hydralazine/nitrates given demographic and good renal safety profile. Advised to monitor BP and call for symptoms of low BPs. Educated on diet, sodium restriction, exercise and weight reduction as a means of BP control and overall cardiac risk reduction. He will avoid pre-workout supplements going forward. Advised on sleep study as an OP.

## 2014-01-03 NOTE — Assessment & Plan Note (Signed)
Noted on prior notes. Venous insufficiency and high sodium intake likely contributing. Will obtain 2D echo given endorsement of DOE, orthopnea, reduced functional capacity. Again, this may be related to deconditioning from obesity.

## 2014-01-03 NOTE — Assessment & Plan Note (Signed)
CKD, stage III. Question hypertensive nephropathy. No h/o DM2. Cr 1.9-2.0 in the past (dating back to 2010). Hold ARB as above. Avoid nephrotoxic meds. Continue to monitor and follow-up with Dr. Justin Mend, his nephrologist.

## 2014-01-03 NOTE — Patient Instructions (Signed)
We will stop Losartan, start hydralazine 51m by mouth twice a day and refill Imdur 676mby mouth twice a day.   We will see you back in 6 months.  Please pursue an exercise regimen as we discussed today.  Limit salt intake at home to less than 4-5 grams per day (sodium).   Please avoid work-out supplements.   Please request a sleep study to be arranged by your primary doctor.  We will obtain an ultrasound of your heart next week.

## 2014-01-11 ENCOUNTER — Other Ambulatory Visit: Payer: Commercial Managed Care - PPO

## 2014-01-15 ENCOUNTER — Ambulatory Visit: Payer: Commercial Managed Care - PPO | Admitting: Cardiovascular Disease

## 2014-01-15 ENCOUNTER — Encounter (HOSPITAL_COMMUNITY): Payer: Self-pay | Admitting: Cardiovascular Disease

## 2014-01-15 ENCOUNTER — Other Ambulatory Visit: Payer: Commercial Managed Care - PPO

## 2014-01-15 DIAGNOSIS — R0989 Other specified symptoms and signs involving the circulatory and respiratory systems: Secondary | ICD-10-CM

## 2014-01-22 ENCOUNTER — Other Ambulatory Visit: Payer: Self-pay

## 2014-01-22 ENCOUNTER — Other Ambulatory Visit (INDEPENDENT_AMBULATORY_CARE_PROVIDER_SITE_OTHER): Payer: Commercial Managed Care - PPO

## 2014-01-22 DIAGNOSIS — R609 Edema, unspecified: Secondary | ICD-10-CM

## 2014-01-22 DIAGNOSIS — R0600 Dyspnea, unspecified: Secondary | ICD-10-CM

## 2014-05-23 ENCOUNTER — Emergency Department (HOSPITAL_COMMUNITY)
Admission: EM | Admit: 2014-05-23 | Discharge: 2014-05-23 | Disposition: A | Discharge status: Home / Self Care | Payer: Commercial Managed Care - PPO | Source: Home / Self Care | Attending: Emergency Medicine | Admitting: Emergency Medicine

## 2014-05-23 ENCOUNTER — Encounter (HOSPITAL_COMMUNITY): Payer: Self-pay | Admitting: Emergency Medicine

## 2014-05-23 DIAGNOSIS — M79609 Pain in unspecified limb: Secondary | ICD-10-CM

## 2014-05-23 DIAGNOSIS — M79671 Pain in right foot: Secondary | ICD-10-CM

## 2014-05-23 NOTE — Discharge Instructions (Signed)
I'm not sure why your heel hurts.  Rest the foot. Keep it elevated. Apply ice for 20 minutes 3 times a day. Take tylenol 1,044m 3 times a day for the next week. Use ibuprofen only if needed.  Follow up with your regular doctor if there is no improvement next week.  You may need a referral to an orthopedic doctor.

## 2014-05-23 NOTE — ED Provider Notes (Signed)
CSN: [id number]     Arrival date & time 05/23/14  1943 History   First MD Initiated Contact with Patient 05/23/14 2008     Chief Complaint  Patient presents with  . Foot Pain    pain in right heel   (Consider location/radiation/quality/duration/timing/severity/associated sxs/prior Treatment) HPI He is a 48 year old man here for evaluation of right heel pain. He states this originally occurred last week, but resolved. It then recurred today at work. It is located on the medial aspect of the heel. It is worse with weightbearing. It does not radiate. No swelling. He denies any injury. He does work on his feet, but wears supportive work boots. No new shoes.  Past Medical History  Diagnosis Date  . History of chicken pox   . Mumps   . Ulcerative colitis [place], [place]  . Hypertension   . Anemia     iron deficiency  . Melanosis coli   . CKD (chronic kidney disease) stage 3, GFR 30-59 ml/min    Past Surgical History  Procedure Laterality Date  . Tendon repair  1987    Left index finger   Family History  Problem Relation Age of Onset  . Hypertension Father    History  Substance Use Topics  . Smoking status: Never Smoker   . Smokeless tobacco: Never Used  . Alcohol Use: Yes     Comment: moderate    Review of Systems  Musculoskeletal:       Right heel pain    Allergies  Review of patient's allergies indicates no known allergies.  Home Medications   Prior to Admission medications   Medication Sig Start Date End Date Taking? Authorizing Provider  allopurinol (ZYLOPRIM) 300 MG tablet Take 300 mg by mouth daily.     Yes Historical Provider, MD  atenolol (TENORMIN) 100 MG tablet Take 100 mg by mouth daily.     Yes Historical Provider, MD  furosemide (LASIX) 40 MG tablet Take 40 mg by mouth daily.     Yes Historical Provider, MD  hydrALAZINE (APRESOLINE) 50 MG tablet Take 1 tablet (50 mg total) by mouth 2 (two) times daily. 01/03/14  Yes Roger A Arguello, PA-C    isosorbide mononitrate (IMDUR) 60 MG 24 hr tablet Take 1 tablet (60 mg total) by mouth 2 (two) times daily. 01/03/14 01/03/15 Yes Roger A Arguello, PA-C  colchicine 0.6 MG tablet Take 0.6 mg by mouth daily as needed.     Historical Provider, MD   BP 162/103  Pulse 53  Temp(Src) 97.8 F (36.6 C) (Oral)  Resp 18  SpO2 99% Physical Exam  Constitutional: He is oriented to person, place, and time. He appears well-developed and well-nourished. No distress.  Musculoskeletal:       Right foot: He exhibits tenderness (as marked in picture). He exhibits normal range of motion, no swelling and no deformity.       Feet:  5/5 strength in plantar flexion without pain.  5/5 strength in dorsiflexion but with pain.  Neurological: He is alert and oriented to person, place, and time.    ED Course  Procedures (including critical care time) Labs Review Labs Reviewed - No data to display  Imaging Review No results found.   MDM   1. Heel pain, right    Unclear etiology. Pain is not consistent with plantar fasciitis or Achilles tendinopathy. No injury that would warrant evaluation with x-ray. We'll treat conservatively with rest, ice, elevation, Tylenol. Work note provided  for tomorrow. Followup with primary care provider if no improvement next week. May require referral to orthopedics.    Melony Overly, MD 05/23/14 2049

## 2014-05-23 NOTE — ED Notes (Signed)
C/o  Right heel pain on set today.  Pain felt with weight bearing.  States same thing happened a couple weeks ago.  Hx of gout.    No relief with otc meds.  Denies any known injury.

## 2014-06-16 ENCOUNTER — Other Ambulatory Visit: Payer: Self-pay | Admitting: Physician Assistant

## 2014-08-28 ENCOUNTER — Other Ambulatory Visit: Payer: Self-pay | Admitting: Physician Assistant

## 2014-09-23 ENCOUNTER — Ambulatory Visit: Payer: Commercial Managed Care - PPO | Admitting: Cardiovascular Disease

## 2014-09-23 ENCOUNTER — Encounter: Payer: Self-pay | Admitting: *Deleted

## 2014-09-28 ENCOUNTER — Emergency Department (HOSPITAL_COMMUNITY)
Admission: EM | Admit: 2014-09-28 | Discharge: 2014-09-28 | Disposition: A | Discharge status: Home / Self Care | Payer: Commercial Managed Care - PPO | Attending: Emergency Medicine | Admitting: Emergency Medicine

## 2014-09-28 ENCOUNTER — Emergency Department (HOSPITAL_COMMUNITY): Payer: Commercial Managed Care - PPO

## 2014-09-28 ENCOUNTER — Encounter (HOSPITAL_COMMUNITY): Payer: Self-pay | Admitting: Adult Health

## 2014-09-28 DIAGNOSIS — K6289 Other specified diseases of anus and rectum: Secondary | ICD-10-CM | POA: Diagnosis present

## 2014-09-28 DIAGNOSIS — Z8719 Personal history of other diseases of the digestive system: Secondary | ICD-10-CM | POA: Diagnosis not present

## 2014-09-28 DIAGNOSIS — Z79899 Other long term (current) drug therapy: Secondary | ICD-10-CM | POA: Insufficient documentation

## 2014-09-28 DIAGNOSIS — Z862 Personal history of diseases of the blood and blood-forming organs and certain disorders involving the immune mechanism: Secondary | ICD-10-CM | POA: Diagnosis not present

## 2014-09-28 DIAGNOSIS — N183 Chronic kidney disease, stage 3 (moderate): Secondary | ICD-10-CM | POA: Diagnosis not present

## 2014-09-28 DIAGNOSIS — I129 Hypertensive chronic kidney disease with stage 1 through stage 4 chronic kidney disease, or unspecified chronic kidney disease: Secondary | ICD-10-CM | POA: Diagnosis not present

## 2014-09-28 DIAGNOSIS — Z8619 Personal history of other infectious and parasitic diseases: Secondary | ICD-10-CM | POA: Insufficient documentation

## 2014-09-28 LAB — COMPREHENSIVE METABOLIC PANEL
ALBUMIN: 2.9 g/dL — AB (ref 3.5–5.2)
ALK PHOS: 32 U/L — AB (ref 39–117)
ALT: 21 U/L (ref 0–53)
ANION GAP: 13 (ref 5–15)
AST: 60 U/L — ABNORMAL HIGH (ref 0–37)
BUN: 25 mg/dL — AB (ref 6–23)
CO2: 24 mEq/L (ref 19–32)
Calcium: 9.2 mg/dL (ref 8.4–10.5)
Chloride: 100 mEq/L (ref 96–112)
Creatinine, Ser: 1.91 mg/dL — ABNORMAL HIGH (ref 0.50–1.35)
GFR calc Af Amer: 46 mL/min — ABNORMAL LOW (ref >90)
GFR calc non Af Amer: 39 mL/min — ABNORMAL LOW (ref >90)
Glucose, Bld: 106 mg/dL — ABNORMAL HIGH (ref 70–99)
Potassium: 5.8 mEq/L — ABNORMAL HIGH (ref 3.7–5.3)
SODIUM: 137 meq/L (ref 137–147)
TOTAL PROTEIN: 7 g/dL (ref 6.0–8.3)
Total Bilirubin: 0.5 mg/dL (ref 0.3–1.2)

## 2014-09-28 LAB — CBC WITH DIFFERENTIAL/PLATELET
BASOS PCT: 0 % (ref 0–1)
Basophils Absolute: 0.1 10*3/uL (ref 0.0–0.1)
EOS ABS: 0.3 10*3/uL (ref 0.0–0.7)
EOS PCT: 3 % (ref 0–5)
HCT: 35 % — ABNORMAL LOW (ref 39.0–52.0)
Hemoglobin: 12.1 g/dL — ABNORMAL LOW (ref 13.0–17.0)
Lymphocytes Relative: 12 % (ref 12–46)
Lymphs Abs: 1.6 10*3/uL (ref 0.7–4.0)
MCH: 35.2 pg — ABNORMAL HIGH (ref 26.0–34.0)
MCHC: 34.6 g/dL (ref 30.0–36.0)
MCV: 101.7 fL — ABNORMAL HIGH (ref 78.0–100.0)
MONO ABS: 1.4 10*3/uL — AB (ref 0.1–1.0)
Monocytes Relative: 10 % (ref 3–12)
Neutro Abs: 10 10*3/uL — ABNORMAL HIGH (ref 1.7–7.7)
Neutrophils Relative %: 75 % (ref 43–77)
PLATELETS: 333 10*3/uL (ref 150–400)
RBC: 3.44 MIL/uL — ABNORMAL LOW (ref 4.22–5.81)
RDW: 14.5 % (ref 11.5–15.5)
WBC: 13.3 10*3/uL — ABNORMAL HIGH (ref 4.0–10.5)

## 2014-09-28 LAB — POTASSIUM: POTASSIUM: 4.3 meq/L (ref 3.7–5.3)

## 2014-09-28 MED ORDER — ONDANSETRON 4 MG PO TBDP: 4.0000 mg | ORAL_TABLET | Freq: Three times a day (TID) | ORAL | Status: DC | PRN

## 2014-09-28 MED ORDER — HYDROCODONE-ACETAMINOPHEN 5-325 MG PO TABS: 1.0000 | ORAL_TABLET | Freq: Four times a day (QID) | ORAL | Status: DC | PRN

## 2014-09-28 MED ORDER — METRONIDAZOLE 500 MG PO TABS: 500.0000 mg | ORAL_TABLET | Freq: Two times a day (BID) | ORAL | Status: DC

## 2014-09-28 MED ORDER — MORPHINE SULFATE 4 MG/ML IJ SOLN
4.0000 mg | Freq: Once | INTRAMUSCULAR | Status: AC
  Administered 2014-09-28: 4 mg via INTRAVENOUS
  Filled 2014-09-28: qty 1

## 2014-09-28 MED ORDER — CIPROFLOXACIN HCL 500 MG PO TABS: 500.0000 mg | ORAL_TABLET | Freq: Two times a day (BID) | ORAL | Status: DC

## 2014-09-28 MED ORDER — IOHEXOL 300 MG/ML  SOLN
80.0000 mL | Freq: Once | INTRAMUSCULAR | Status: AC | PRN
  Administered 2014-09-28: 80 mL via INTRAVENOUS

## 2014-09-28 MED ORDER — METRONIDAZOLE IN NACL 5-0.79 MG/ML-% IV SOLN
500.0000 mg | Freq: Once | INTRAVENOUS | Status: AC
  Administered 2014-09-28: 500 mg via INTRAVENOUS
  Filled 2014-09-28: qty 100

## 2014-09-28 MED ORDER — IOHEXOL 300 MG/ML  SOLN
25.0000 mL | INTRAMUSCULAR | Status: AC
  Administered 2014-09-28: 25 mL via ORAL

## 2014-09-28 MED ORDER — CIPROFLOXACIN IN D5W 400 MG/200ML IV SOLN
400.0000 mg | Freq: Once | INTRAVENOUS | Status: AC
  Administered 2014-09-28: 400 mg via INTRAVENOUS
  Filled 2014-09-28: qty 200

## 2014-09-28 MED ORDER — ONDANSETRON HCL 4 MG/2ML IJ SOLN
4.0000 mg | Freq: Once | INTRAMUSCULAR | Status: AC
  Administered 2014-09-28: 4 mg via INTRAVENOUS
  Filled 2014-09-28: qty 2

## 2014-09-28 NOTE — ED Provider Notes (Signed)
Care of the patient was assumed at signout. On several repeat exams the patient had good control of his pain, no new complains. He and I had a conversation about the CT results, the possibilities of a phlegmon versus abscess. Patient remained afebrile, hemodynamically stable. Patient was started on antibiotics, for possibility of abscess, will follow up with his gastroenterologist in 2 days via telephone. Exposer return precautions discussed.   Carmin Muskrat, MD 09/28/14 [phone]

## 2014-09-28 NOTE — ED Notes (Signed)
EDP at bedside  

## 2014-09-28 NOTE — ED Notes (Signed)
Presents with rectal pain began Wednesday night and has gotten worse, last normal BM was "quite a few days ago" placing suppositories with no relief. Rectal pain is severe, denies insertion of foreign objects into rectum. Denies drainage or leakage. Denies bleeding

## 2014-09-28 NOTE — ED Provider Notes (Signed)
CSN: [id number]     Arrival date & time 09/28/14  0416 History   First MD Initiated Contact with Patient 09/28/14 0454     Chief Complaint  Patient presents with  . Rectal Pain     (Consider location/radiation/quality/duration/timing/severity/associated sxs/prior Treatment) HPI Patient presents with 2 days of rectal pain. Difficulty having a bowel movement due to pain. Has a history of ulcerative colitis. No known rectal bleeding. No fever or chills. Discussed with his gastroenterologist who prescribed a cream. There is no change in the patient's symptoms. Past Medical History  Diagnosis Date  . History of chicken pox   . Mumps   . Ulcerative colitis [place], [place]  . Hypertension   . Anemia     iron deficiency  . Melanosis coli   . CKD (chronic kidney disease) stage 3, GFR 30-59 ml/min    Past Surgical History  Procedure Laterality Date  . Tendon repair  1987    Left index finger   Family History  Problem Relation Age of Onset  . Hypertension Father    History  Substance Use Topics  . Smoking status: Never Smoker   . Smokeless tobacco: Never Used  . Alcohol Use: Yes     Comment: moderate    Review of Systems  Constitutional: Negative for fever and chills.  Respiratory: Negative for shortness of breath.   Gastrointestinal: Positive for constipation and rectal pain. Negative for nausea, vomiting, abdominal pain, diarrhea and blood in stool.  Musculoskeletal: Negative for myalgias, back pain, neck pain and neck stiffness.  Skin: Negative for rash and wound.  Neurological: Negative for dizziness, seizures, numbness and headaches.  All other systems reviewed and are negative.     Allergies  Review of patient's allergies indicates no known allergies.  Home Medications   Prior to Admission medications   Medication Sig Start Date End Date Taking? Authorizing Provider  allopurinol (ZYLOPRIM) 300 MG tablet Take 600 mg by mouth daily.    Yes Historical  Provider, MD  atenolol (TENORMIN) 100 MG tablet Take 100 mg by mouth daily.     Yes Historical Provider, MD  colchicine 0.6 MG tablet Take 0.6 mg by mouth daily as needed (gout).    Yes Historical Provider, MD  furosemide (LASIX) 80 MG tablet Take 160 mg by mouth every evening.   Yes Historical Provider, MD  hydrALAZINE (APRESOLINE) 50 MG tablet TAKE 1 TABLET (50 MG TOTAL) BY MOUTH 2 (TWO) TIMES DAILY. Patient taking differently: Take 126m daily 08/29/14  Yes TMinna Merritts MD  isosorbide mononitrate (IMDUR) 60 MG 24 hr tablet TAKE 1 TABLET (60 MG TOTAL) BY MOUTH 2 (TWO) TIMES DAILY. Patient taking differently: Take 1262mdaily 06/17/14  Yes TiMinna MerrittsMD  nitroGLYCERIN (NITROGLYN) 2 % ointment Apply 1 inch topically 3 (three) times daily.   Yes Historical Provider, MD  pravastatin (PRAVACHOL) 40 MG tablet Take 40 mg by mouth daily.   Yes Historical Provider, MD  ciprofloxacin (CIPRO) 500 MG tablet Take 1 tablet (500 mg total) by mouth 2 (two) times daily. 09/28/14   RoCarmin MuskratMD  HYDROcodone-acetaminophen (NORCO/VICODIN) 5-325 MG per tablet Take 1 tablet by mouth every 6 (six) hours as needed for severe pain. 09/28/14   RoCarmin MuskratMD  metroNIDAZOLE (FLAGYL) 500 MG tablet Take 1 tablet (500 mg total) by mouth 2 (two) times daily. 09/28/14   RoCarmin MuskratMD  ondansetron (ZOFRAN ODT) 4 MG disintegrating tablet Take 1 tablet (4 mg total)  by mouth every 8 (eight) hours as needed for nausea or vomiting. 09/28/14   Carmin Muskrat, MD   BP 126/100 mmHg  Pulse 63  Temp(Src) 98.1 F (36.7 C) (Oral)  Resp 20  Ht 5' 5"  (1.651 m)  Wt 235 lb (106.595 kg)  BMI 39.11 kg/m2  SpO2 100% Physical Exam  Constitutional: He is oriented to person, place, and time. He appears well-developed and well-nourished. No distress.  HENT:  Head: Normocephalic and atraumatic.  Mouth/Throat: Oropharynx is clear and moist.  Eyes: EOM are normal. Pupils are equal, round, and reactive to light.   Neck: Normal range of motion. Neck supple.  Cardiovascular: Normal rate and regular rhythm.   Pulmonary/Chest: Effort normal and breath sounds normal. No respiratory distress. He has no wheezes. He has no rales.  Abdominal: Soft. Bowel sounds are normal. He exhibits distension. He exhibits no mass. There is no tenderness. There is no rebound and no guarding.  Genitourinary:  Rectal exam is with at least sensitive. There is a mass in the rectal vault. Likely hemorrhoid versus abscess. No gross blood on exam. No fissures identified.  Musculoskeletal: Normal range of motion. He exhibits no edema or tenderness.  Neurological: He is alert and oriented to person, place, and time.  Skin: Skin is warm and dry. No rash noted. No erythema.  Psychiatric: He has a normal mood and affect. His behavior is normal.  Nursing note and vitals reviewed.   ED Course  Procedures (including critical care time) Labs Review Labs Reviewed  CBC WITH DIFFERENTIAL - Abnormal; Notable for the following:    WBC 13.3 (*)    RBC 3.44 (*)    Hemoglobin 12.1 (*)    HCT 35.0 (*)    MCV 101.7 (*)    MCH 35.2 (*)    Neutro Abs 10.0 (*)    Monocytes Absolute 1.4 (*)    All other components within normal limits  COMPREHENSIVE METABOLIC PANEL - Abnormal; Notable for the following:    Potassium 5.8 (*)    Glucose, Bld 106 (*)    BUN 25 (*)    Creatinine, Ser 1.91 (*)    Albumin 2.9 (*)    AST 60 (*)    Alkaline Phosphatase 32 (*)    GFR calc non Af Amer 39 (*)    GFR calc Af Amer 46 (*)    All other components within normal limits  POTASSIUM    Imaging Review Ct Pelvis W Contrast  09/28/2014   CLINICAL DATA:  48 year old male with 2 day history of rectal pain. The clinical history of ulcerative colitis.  EXAM: CT PELVIS WITH CONTRAST  TECHNIQUE: Multidetector CT imaging of the pelvis was performed using the standard protocol following the bolus administration of intravenous contrast.  CONTRAST:  54m OMNIPAQUE  IOHEXOL 300 MG/ML  SOLN  COMPARISON:  Most recent prior CT abdomen/ pelvis 11/14/2010  FINDINGS: Pelvis: Normal appendix in the right lower quadrant. No focal bowel wall thickening or inflammatory change. No free fluid or suspicious adenopathy. The bladder, seminal vesicles and prostate gland are unremarkable.  Bones/Soft Tissues: There is a subtle 16 x 11 mm low-attenuation collection in the perianal soft tissues just posterior to the anal verge at the 6 o'clock position. No significant surrounding inflammatory change. The ischiorectal fossa a are clear. No acute fracture or aggressive appearing lytic or blastic osseous lesion.  Vascular: No significant atherosclerotic vascular disease, aneurysmal dilatation or acute abnormality.  IMPRESSION: 1. Subtle 16 x 11 mm low-attenuation focus  in the posterior perianal soft tissues at approximately the 6 o'clock position in the region of the anal verge. If this location corresponds to the palpable mass, this could conceivably represent a small perianal phlegmon or abscess. However, there is no significant surrounding inflammatory change. 2. No acute abnormality within the abdomen or pelvis. No rectal wall thickening or evidence of active inflammation.   Electronically Signed   By: Jacqulynn Cadet M.D.   On: 09/28/2014 08:09     EKG Interpretation None      MDM   Final diagnoses:  Rectal mass    Will obtain CT pelvis to rule out abscess. Discuss with oncoming emergency physician and will follow CT results.    Julianne Rice, MD 09/28/14 [phone]

## 2014-09-28 NOTE — Discharge Instructions (Signed)
As discussed, it is important that you follow up with your physician for continued management of your condition.  If you develop any new, or concerning changes in your condition, please return to the emergency department immediately.

## 2015-07-29 ENCOUNTER — Other Ambulatory Visit: Payer: Self-pay | Admitting: Cardiovascular Disease

## 2015-10-02 ENCOUNTER — Encounter: Payer: Self-pay | Admitting: Cardiovascular Disease

## 2015-10-02 ENCOUNTER — Ambulatory Visit (INDEPENDENT_AMBULATORY_CARE_PROVIDER_SITE_OTHER): Payer: Commercial Managed Care - PPO | Admitting: Cardiovascular Disease

## 2015-10-02 VITALS — BP 130/85 | HR 81 | Ht 64.5 in | Wt 240.2 lb

## 2015-10-02 DIAGNOSIS — E785 Hyperlipidemia, unspecified: Secondary | ICD-10-CM | POA: Insufficient documentation

## 2015-10-02 DIAGNOSIS — K5180 Other ulcerative colitis without complications: Secondary | ICD-10-CM | POA: Diagnosis not present

## 2015-10-02 DIAGNOSIS — R600 Localized edema: Secondary | ICD-10-CM | POA: Diagnosis not present

## 2015-10-02 DIAGNOSIS — I10 Essential (primary) hypertension: Secondary | ICD-10-CM | POA: Diagnosis not present

## 2015-10-02 DIAGNOSIS — N183 Chronic kidney disease, stage 3 unspecified: Secondary | ICD-10-CM

## 2015-10-02 MED ORDER — ISOSORBIDE MONONITRATE ER 60 MG PO TB24: 60.0000 mg | ORAL_TABLET | Freq: Every day | ORAL | Status: DC

## 2015-10-02 MED ORDER — HYDRALAZINE HCL 50 MG PO TABS: 50.0000 mg | ORAL_TABLET | Freq: Three times a day (TID) | ORAL | Status: DC | PRN

## 2015-10-02 NOTE — Assessment & Plan Note (Signed)
Strong genetic component, father with similar issues. Long-standing hypertension Discussed this with him and the need to keep his blood pressure low Avoid NSAIDs

## 2015-10-02 NOTE — Assessment & Plan Note (Signed)
He prefers to take all of his medications at nighttime Forgets to take them in the morning With this in mind, we have suggested he hold the hydralazine, start isosorbide 60 mg at night, continue atenolol 100 mg. Suggested he use hydralazine as needed for high blood pressure

## 2015-10-02 NOTE — Patient Instructions (Addendum)
You are doing well.  Please restart isosorbide one a day at night with atenolol  Please take hydralazine as needed for high blood pressure  Consider compression hose ([email or web address])  Please call us if you have new issues that need to be addressed before your next appt.  Your physician wants you to follow-up in: 12 months.  You will receive a reminder letter in the mail two months in advance. If you don't receive a letter, please call our office to schedule the follow-up appointment.

## 2015-10-02 NOTE — Assessment & Plan Note (Signed)
Dramatic rise in his total cholesterol reports compliant with his medication Recommended if his cholesterol continues to run high, could be changed to Crestor 40 mg daily, In addition, zetia 10 mg daily could be added if needed

## 2015-10-02 NOTE — Assessment & Plan Note (Signed)
He reports recent flare, given suppositories, prednisone, improvement of his symptoms

## 2015-10-02 NOTE — Assessment & Plan Note (Signed)
Leg edema likely exacerbated by his obesity/weight. Does not appear fluid overloaded. Recommended compression hose Suggested he talk to renal physician about his Lasix dosing, ideally would be better to take this twice a day rather than 160 mg all at once

## 2015-10-02 NOTE — Progress Notes (Signed)
Patient ID: Colin [name], male    DOB: Nov 10, 1965, 49 y.o.   MRN: [id number]  HPI Comments: Colin [name] is a very pleasant 49 year old gentleman, patient of Dr.Ehinger, with a history of hypertension, chronic renal failure,  ulcerative colitis dating back to 2000. Chronic leg edema He presents today for follow-up of his hypertension  In follow-up, he reports that he is doing well. He is been taking hydralazine 100 mg at night, atenolol 100 mg at night, no medications in the morning No longer takes isosorbide, ran out Leg edema is relatively well-controlled. He is been taking Lasix 160 mg at night. He does not like to take medications in the morning  Otherwise denies any new issues No chest pain or shortness of breath on exertion Lab work reviewed with him showing dramatic climb in his cholesterol up to 257 per the notes from primary care. He reports compliance with his pravastatin Prior to that, cholesterol was 176 in May 2016  EKG on today's visit shows normal sinus rhythm with rate 81 bpm, no significant ST or T-wave changes  Other past medical history He is a nonsmoker, nondiabetic. Father died early from kidney related complications and hemodialysis.    No Known Allergies  Current Outpatient Prescriptions on File Prior to Visit  Medication Sig Dispense Refill  . allopurinol (ZYLOPRIM) 300 MG tablet Take 600 mg by mouth daily.     Marland Kitchen atenolol (TENORMIN) 100 MG tablet Take 100 mg by mouth daily.      . colchicine 0.6 MG tablet Take 0.6 mg by mouth daily as needed (gout).     . furosemide (LASIX) 80 MG tablet Take 160 mg by mouth every evening.    . pravastatin (PRAVACHOL) 40 MG tablet Take 40 mg by mouth daily.     No current facility-administered medications on file prior to visit.    Past Medical History  Diagnosis Date  . History of chicken pox   . Mumps   . Ulcerative colitis [place], [place]  . Hypertension   . Anemia     iron deficiency  . Melanosis coli    . CKD (chronic kidney disease) stage 3, GFR 30-59 ml/min     Past Surgical History  Procedure Laterality Date  . Tendon repair  1987    Left index finger    Social History  reports that he has never smoked. He has never used smokeless tobacco. He reports that he drinks alcohol. He reports that he does not use illicit drugs.  Family History family history includes Hypertension in his father.   Review of Systems  Constitutional: Negative.   Respiratory: Negative.   Cardiovascular: Negative.   Gastrointestinal: Negative.   Musculoskeletal: Negative.   Neurological: Negative.   Psychiatric/Behavioral: Negative.   All other systems reviewed and are negative.   BP 130/85 mmHg  Pulse 81  Ht 5' 4.5" (1.638 m)  Wt 240 lb 4 oz (108.977 kg)  BMI 40.62 kg/m2   Physical Exam  Constitutional: He is oriented to person, place, and time. He appears well-developed and well-nourished.  HENT:  Head: Normocephalic.  Nose: Nose normal.  Mouth/Throat: Oropharynx is clear and moist.  Eyes: Conjunctivae are normal. Pupils are equal, round, and reactive to light.  Neck: Normal range of motion. Neck supple. No JVD present.  Cardiovascular: Normal rate, regular rhythm, S1 normal, S2 normal, normal heart sounds and intact distal pulses.  Exam reveals no gallop and no friction rub.   No  murmur heard. Trace edema around the ankles  Pulmonary/Chest: Effort normal and breath sounds normal. No respiratory distress. He has no wheezes. He has no rales. He exhibits no tenderness.  Abdominal: Soft. Bowel sounds are normal. He exhibits no distension. There is no tenderness.  Musculoskeletal: Normal range of motion. He exhibits no edema or tenderness.  Lymphadenopathy:    He has no cervical adenopathy.  Neurological: He is alert and oriented to person, place, and time. Coordination normal.  Skin: Skin is warm and dry. No rash noted. No erythema.  Psychiatric: He has a normal mood and affect. His  behavior is normal. Judgment and thought content normal.      Assessment and Plan   Nursing note and vitals reviewed.

## 2015-12-18 ENCOUNTER — Emergency Department (HOSPITAL_COMMUNITY)
Admission: EM | Admit: 2015-12-18 | Discharge: 2015-12-18 | Disposition: A | Discharge status: Home / Self Care | Payer: Commercial Managed Care - PPO | Source: Home / Self Care | Attending: Family Medicine | Admitting: Family Medicine

## 2015-12-18 ENCOUNTER — Encounter (HOSPITAL_COMMUNITY): Payer: Self-pay | Admitting: *Deleted

## 2015-12-18 DIAGNOSIS — S161XXA Strain of muscle, fascia and tendon at neck level, initial encounter: Secondary | ICD-10-CM

## 2015-12-18 MED ORDER — DICLOFENAC POTASSIUM 50 MG PO TABS: 50.0000 mg | ORAL_TABLET | Freq: Three times a day (TID) | ORAL | Status: DC

## 2015-12-18 MED ORDER — CYCLOBENZAPRINE HCL 5 MG PO TABS: 5.0000 mg | ORAL_TABLET | Freq: Three times a day (TID) | ORAL | Status: DC | PRN

## 2015-12-18 NOTE — Discharge Instructions (Signed)
Heat to your neck , use medicine as needed,  See physical therapy for help with muscles in neck.

## 2015-12-18 NOTE — ED Provider Notes (Signed)
CSN: [id number]     Arrival date & time 12/18/15  1853 History   First MD Initiated Contact with Patient 12/18/15 2017     Chief Complaint  Patient presents with  . Neck Pain   (Consider location/radiation/quality/duration/timing/severity/associated sxs/prior Treatment) Patient is a 50 y.o. male presenting with neck pain. The history is provided by the patient.  Neck Pain Pain location:  R side Quality:  Shooting and stiffness Pain radiates to:  Does not radiate Pain severity:  Mild Onset quality:  Sudden Progression:  Unchanged Chronicity:  Recurrent Context: lifting a heavy object and recent injury   Context comment:  Injury 2weeks ago, seen at another facility and improved but worse today with bench pressing and moving boxes, no neuro sx.   Past Medical History  Diagnosis Date  . History of chicken pox   . Mumps   . Ulcerative colitis [place], [place]  . Hypertension   . Anemia     iron deficiency  . Melanosis coli   . CKD (chronic kidney disease) stage 3, GFR 30-59 ml/min    Past Surgical History  Procedure Laterality Date  . Tendon repair  1987    Left index finger   Family History  Problem Relation Age of Onset  . Hypertension Father    Social History  Substance Use Topics  . Smoking status: Never Smoker   . Smokeless tobacco: Never Used  . Alcohol Use: Yes     Comment: moderate    Review of Systems  Constitutional: Negative.   HENT: Negative.   Musculoskeletal: Positive for neck pain. Negative for back pain, gait problem and neck stiffness.  Skin: Negative.   Neurological: Negative.   All other systems reviewed and are negative.   Allergies  Review of patient's allergies indicates no known allergies.  Home Medications   Prior to Admission medications   Medication Sig Start Date End Date Taking? Authorizing Provider  allopurinol (ZYLOPRIM) 300 MG tablet Take 600 mg by mouth daily.     Historical Provider, MD  atenolol (TENORMIN) 100 MG  tablet Take 100 mg by mouth daily.      Historical Provider, MD  colchicine 0.6 MG tablet Take 0.6 mg by mouth daily as needed (gout).     Historical Provider, MD  furosemide (LASIX) 80 MG tablet Take 160 mg by mouth every evening.    Historical Provider, MD  hydrALAZINE (APRESOLINE) 50 MG tablet Take 1 tablet (50 mg total) by mouth 3 (three) times daily as needed. 10/02/15   Minna Merritts, MD  isosorbide mononitrate (IMDUR) 60 MG 24 hr tablet Take 1 tablet (60 mg total) by mouth daily. 10/02/15   Minna Merritts, MD  pravastatin (PRAVACHOL) 40 MG tablet Take 40 mg by mouth daily.    Historical Provider, MD   Meds Ordered and Administered this Visit  Medications - No data to display  BP 147/90 mmHg  Pulse 74  Temp(Src) 98.5 F (36.9 C) (Oral)  Resp 16  SpO2 99% No data found.   Physical Exam  Constitutional: He is oriented to person, place, and time. He appears well-developed and well-nourished. No distress.  Neck: Muscular tenderness present. No spinous process tenderness present. No rigidity. Decreased range of motion present.    Musculoskeletal: He exhibits tenderness.  Lymphadenopathy:    He has no cervical adenopathy.  Neurological: He is alert and oriented to person, place, and time.  Skin: Skin is warm and dry.  Nursing note and  vitals reviewed.   ED Course  Procedures (including critical care time)  Labs Review Labs Reviewed - No data to display  Imaging Review No results found.   Visual Acuity Review  Right Eye Distance:   Left Eye Distance:   Bilateral Distance:    Right Eye Near:   Left Eye Near:    Bilateral Near:         MDM  No diagnosis found.  Meds ordered this encounter  Medications  . diclofenac (CATAFLAM) 50 MG tablet    Sig: Take 1 tablet (50 mg total) by mouth 3 (three) times daily.    Dispense:  30 tablet    Refill:  0  . cyclobenzaprine (FLEXERIL) 5 MG tablet    Sig: Take 1 tablet (5 mg total) by mouth 3 (three) times daily  as needed for muscle spasms.    Dispense:  30 tablet    Refill:  0      Billy Fischer, MD 12/18/15 2037

## 2015-12-18 NOTE — ED Notes (Addendum)
Pt  Reports  Pain  r  Shoulder   And  Neck      X   Several  Weeks        - pt    Reports  Was  Seen  sev  Weeks  Ago      And  Was  rx   Gabapentin     And  Amitriptyline          He  Reports  He  reinjured  The  r  Shoulder        Recently  Moving  Boxes     He  Reports  Pain on  Movement      Pt  Ambulated  To  Room  With a  Steady  Fluid  Gait

## 2015-12-18 NOTE — ED Notes (Signed)
Pt  Called   Stating    That     He   Cannot  Take   diclofinac    Due  To  Kidney  Issues      Pt  Spoke  On the  Phone   With   Dr   Juventino Slovak   And  He    Advised  Pt  To  Take  Tylenol

## 2016-05-21 ENCOUNTER — Other Ambulatory Visit: Payer: Self-pay | Admitting: *Deleted

## 2016-05-21 DIAGNOSIS — N185 Chronic kidney disease, stage 5: Secondary | ICD-10-CM

## 2016-05-21 DIAGNOSIS — Z01810 Encounter for preprocedural cardiovascular examination: Secondary | ICD-10-CM

## 2016-06-08 ENCOUNTER — Ambulatory Visit: Payer: Commercial Managed Care - PPO | Admitting: Vascular Surgery

## 2016-06-08 ENCOUNTER — Encounter (HOSPITAL_COMMUNITY): Payer: Commercial Managed Care - PPO

## 2016-06-08 ENCOUNTER — Other Ambulatory Visit (HOSPITAL_COMMUNITY): Payer: Commercial Managed Care - PPO

## 2016-07-09 ENCOUNTER — Encounter (HOSPITAL_COMMUNITY): Payer: Self-pay | Admitting: Family Medicine

## 2016-07-09 ENCOUNTER — Ambulatory Visit (HOSPITAL_COMMUNITY)
Admission: EM | Admit: 2016-07-09 | Discharge: 2016-07-09 | Disposition: A | Discharge status: Home / Self Care | Payer: Commercial Managed Care - PPO | Attending: Family Medicine | Admitting: Family Medicine

## 2016-07-09 DIAGNOSIS — S161XXA Strain of muscle, fascia and tendon at neck level, initial encounter: Secondary | ICD-10-CM

## 2016-07-09 DIAGNOSIS — M436 Torticollis: Secondary | ICD-10-CM

## 2016-07-09 MED ORDER — PREDNISONE 20 MG PO TABS: ORAL_TABLET | ORAL | 0 refills | Status: DC

## 2016-07-09 MED ORDER — CYCLOBENZAPRINE HCL 5 MG PO TABS: 5.0000 mg | ORAL_TABLET | Freq: Three times a day (TID) | ORAL | 0 refills | Status: DC | PRN

## 2016-07-09 NOTE — ED Provider Notes (Signed)
[facility]    CSN: [id number] Arrival date & time: 07/09/16  3646  First Provider Contact:  First MD Initiated Contact with Patient 07/09/16 2003        History   Chief Complaint Chief Complaint  Patient presents with  . Neck Pain    HPI Colin [name] is a 50 y.o. male.   This 50 year old gentleman who arrives with 5 days of neck pain. He will with pain on Monday, took some muscle relaxers and seemed to improve. The pain was initially on the right side.  Starting yesterday the pain seemed to return on the left side of his cervical spine into trouble moving his neck ever since.  He's had no trauma or neck injury in the past.  Patient has no radicular symptoms, no fever, no trouble swallowing  He went to a chiropractor earlier this week but this did not help.      Past Medical History:  Diagnosis Date  . Anemia    iron deficiency  . CKD (chronic kidney disease) stage 3, GFR 30-59 ml/min   . History of chicken pox   . Hypertension   . Melanosis coli   . Mumps   . Ulcerative colitis [place], [place]    Patient Active Problem List   Diagnosis Date Noted  . Bilateral leg edema 10/02/2015  . Hyperlipidemia 10/02/2015  . Edema 02/09/2011  . Chronic renal insufficiency 02/09/2011  . ANEMIA, IRON DEFICIENCY 10/16/2007  . Essential hypertension 10/16/2007  . Ulcerative colitis ([place]) 02/13/2007  . MELANOSIS COLI 02/13/2007    Past Surgical History:  Procedure Laterality Date  . TENDON REPAIR  1987   Left index finger       Home Medications    Prior to Admission medications   Medication Sig Start Date End Date Taking? Authorizing Provider  allopurinol (ZYLOPRIM) 300 MG tablet Take 600 mg by mouth daily.    Yes Historical Provider, MD  atenolol (TENORMIN) 100 MG tablet Take 100 mg by mouth daily.     Yes Historical Provider, MD  furosemide (LASIX) 80 MG tablet Take 160 mg by mouth every evening.   Yes Historical Provider, MD    pravastatin (PRAVACHOL) 40 MG tablet Take 40 mg by mouth daily.   Yes Historical Provider, MD  colchicine 0.6 MG tablet Take 0.6 mg by mouth daily as needed (gout).     Historical Provider, MD  cyclobenzaprine (FLEXERIL) 5 MG tablet Take 1 tablet (5 mg total) by mouth 3 (three) times daily as needed for muscle spasms. 07/09/16   Robyn Haber, MD  diclofenac (CATAFLAM) 50 MG tablet Take 1 tablet (50 mg total) by mouth 3 (three) times daily. 12/18/15   Billy Fischer, MD  hydrALAZINE (APRESOLINE) 50 MG tablet Take 1 tablet (50 mg total) by mouth 3 (three) times daily as needed. 10/02/15   Minna Merritts, MD  isosorbide mononitrate (IMDUR) 60 MG 24 hr tablet Take 1 tablet (60 mg total) by mouth daily. 10/02/15   Minna Merritts, MD  predniSONE (DELTASONE) 20 MG tablet Two daily with food 07/09/16   Robyn Haber, MD    Family History Family History  Problem Relation Age of Onset  . Hypertension Father     Social History Social History  Substance Use Topics  . Smoking status: Never Smoker  . Smokeless tobacco: Never Used  . Alcohol use Yes     Comment: moderate     Allergies   Review of  patient's allergies indicates no known allergies.   Review of Systems Review of Systems  Constitutional: Negative.   HENT: Negative.   Eyes: Negative.   Respiratory: Negative.   Cardiovascular: Negative.   Gastrointestinal: Negative.   Musculoskeletal: Positive for neck pain and neck stiffness.  Neurological: Negative.      Physical Exam Triage Vital Signs ED Triage Vitals [07/09/16 1952]  Enc Vitals Group     BP 149/86     Pulse Rate 79     Resp 12     Temp 98.5 F (36.9 C)     Temp Source Oral     SpO2 99 %     Weight      Height      Head Circumference      Peak Flow      Pain Score      Pain Loc      Pain Edu?      Excl. in [place]?    No data found.   Updated Vital Signs BP 149/86 (BP Location: Left Arm)   Pulse 79   Temp 98.5 F (36.9 C) (Oral)   Resp 12   SpO2  99%    Physical Exam  Constitutional: He is oriented to person, place, and time. He appears well-developed and well-nourished.  HENT:  Head: Normocephalic.  Right Ear: External ear normal.  Left Ear: External ear normal.  Mouth/Throat: Oropharynx is clear and moist.  Eyes: Conjunctivae are normal. Pupils are equal, round, and reactive to light.  Neck:  Patient is rotated his neck to the right about 75, 45 to the left.  He can flex his chin down to his chest but cannot extend his neck to look at the ceiling.  There is no bony tenderness and no bony malalignment of his neck.  Neurological: He is alert and oriented to person, place, and time. He has normal reflexes. No cranial nerve deficit. He exhibits normal muscle tone. Coordination normal.  Nursing note and vitals reviewed.    UC Treatments / Results  Labs (all labs ordered are listed, but only abnormal results are displayed) Labs Reviewed - No data to display  EKG  EKG Interpretation None       Radiology No results found.  Procedures Procedures (including critical care time)  Medications Ordered in UC Medications - No data to display   Initial Impression / Assessment and Plan / UC Course  I have reviewed the triage vital signs and the nursing notes.  Pertinent labs & imaging results that were available during my care of the patient were reviewed by me and considered in my medical decision making (see chart for details).  Clinical Course      Final Clinical Impressions(s) / UC Diagnoses   Final diagnoses:  Cervical strain, acute, initial encounter  Torticollis, acute    New Prescriptions New Prescriptions   PREDNISONE (DELTASONE) 20 MG TABLET    Two daily with food     Robyn Haber, MD 07/09/16 2012

## 2016-07-09 NOTE — ED Triage Notes (Signed)
Pt here for neck pain onset yest  Denies inj/trauma.... Reports pain increases w/movement  Was taking left over muscle relaxer given to him by a doctor w/relief but has run out of them  Steady gait... A&O x4... NAD

## 2016-07-23 ENCOUNTER — Encounter: Payer: Self-pay | Admitting: Vascular Surgery

## 2016-07-27 ENCOUNTER — Encounter: Payer: Self-pay | Admitting: Vascular Surgery

## 2016-07-27 ENCOUNTER — Ambulatory Visit (INDEPENDENT_AMBULATORY_CARE_PROVIDER_SITE_OTHER)
Admission: RE | Admit: 2016-07-27 | Discharge: 2016-07-27 | Disposition: A | Discharge status: Home / Self Care | Payer: Commercial Managed Care - PPO | Source: Ambulatory Visit | Attending: Vascular Surgery | Admitting: Vascular Surgery

## 2016-07-27 ENCOUNTER — Other Ambulatory Visit: Payer: Self-pay

## 2016-07-27 ENCOUNTER — Ambulatory Visit (INDEPENDENT_AMBULATORY_CARE_PROVIDER_SITE_OTHER): Payer: Commercial Managed Care - PPO | Admitting: Vascular Surgery

## 2016-07-27 ENCOUNTER — Ambulatory Visit (HOSPITAL_COMMUNITY)
Admission: RE | Admit: 2016-07-27 | Discharge: 2016-07-27 | Disposition: A | Discharge status: Home / Self Care | Payer: Commercial Managed Care - PPO | Source: Ambulatory Visit | Attending: Vascular Surgery | Admitting: Vascular Surgery

## 2016-07-27 VITALS — BP 139/89 | HR 80 | Temp 98.7°F | Resp 20 | Ht 64.5 in | Wt 240.0 lb

## 2016-07-27 DIAGNOSIS — Z01810 Encounter for preprocedural cardiovascular examination: Secondary | ICD-10-CM

## 2016-07-27 DIAGNOSIS — N185 Chronic kidney disease, stage 5: Secondary | ICD-10-CM | POA: Diagnosis not present

## 2016-07-27 DIAGNOSIS — N184 Chronic kidney disease, stage 4 (severe): Secondary | ICD-10-CM | POA: Diagnosis not present

## 2016-07-27 NOTE — Progress Notes (Signed)
V[facility]in Specialist of [place]  Patient name: Colin [name] MRN: [id number] DOB: [birth year] Sex: male  REASON FOR CONSULT: Relation for AV access for hemodialysis  HPI: Colin [name] is a 50 y.o. male, who is in today for a hemodialysis access. He is not on dialysis currently. He has had progressive renal insufficiency is left-handed. He does not have any uremic symptoms. Does have lower extremity edema. No prior history of cardiac disease or peripheral vascular occlusive disease  Past Medical History:  Diagnosis Date  . Anemia    iron deficiency  . CKD (chronic kidney disease) stage 3, GFR 30-59 ml/min   . History of chicken pox   . Hypertension   . Melanosis coli   . Mumps   . Ulcerative colitis [place], [place]    Family History  Problem Relation Age of Onset  . Hypertension Father     SOCIAL HISTORY: Social History   Social History  . Marital status: Married    Spouse name: N/A  . Number of children: N/A  . Years of education: N/A   Occupational History  . Not on file.   Social History Main Topics  . Smoking status: Never Smoker  . Smokeless tobacco: Never Used  . Alcohol use Yes     Comment: moderate  . Drug use: No  . Sexual activity: Not on file   Other Topics Concern  . Not on file   Social History Narrative  . No narrative on file    No Known Allergies  Current Outpatient Prescriptions  Medication Sig Dispense Refill  . allopurinol (ZYLOPRIM) 300 MG tablet Take 600 mg by mouth daily.     Marland Kitchen atenolol (TENORMIN) 100 MG tablet Take 100 mg by mouth daily.      . colchicine 0.6 MG tablet Take 0.6 mg by mouth daily as needed (gout).     . cyclobenzaprine (FLEXERIL) 5 MG tablet Take 1 tablet (5 mg total) by mouth 3 (three) times daily as needed for muscle spasms. 30 tablet 0  . diclofenac (CATAFLAM) 50 MG tablet Take 1 tablet (50 mg total) by mouth 3 (three) times daily. 30 tablet 0  .  furosemide (LASIX) 80 MG tablet Take 160 mg by mouth every evening.    . hydrALAZINE (APRESOLINE) 50 MG tablet Take 1 tablet (50 mg total) by mouth 3 (three) times daily as needed. 90 tablet 6  . isosorbide mononitrate (IMDUR) 60 MG 24 hr tablet Take 1 tablet (60 mg total) by mouth daily. 90 tablet 4  . pravastatin (PRAVACHOL) 40 MG tablet Take 40 mg by mouth daily.    . predniSONE (DELTASONE) 20 MG tablet Two daily with food 10 tablet 0   No current facility-administered medications for this visit.     REVIEW OF SYSTEMS:  [X]  denotes positive finding, [ ]  denotes negative finding Cardiac  Comments:  Chest pain or chest pressure:     Shortness of breath upon exertion:    Short of breath when lying flat:    Irregular heart rhythm:        Vascular    Pain in calf, thigh, or hip brought on by ambulation:    Pain in feet at night that wakes you up from your sleep:     Blood clot in your veins:    Leg swelling:         Pulmonary    Oxygen at home:    Productive cough:  Wheezing:         Neurologic    Sudden weakness in arms or legs:     Sudden numbness in arms or legs:     Sudden onset of difficulty speaking or slurred speech:    Temporary loss of vision in one eye:     Problems with dizziness:         Gastrointestinal    Blood in stool:     Vomited blood:         Genitourinary    Burning when urinating:     Blood in urine:        Psychiatric    Major depression:         Hematologic    Bleeding problems:    Problems with blood clotting too easily:        Skin    Rashes or ulcers:        Constitutional    Fever or chills:      PHYSICAL EXAM: Vitals:   07/27/16 1327  BP: 139/89  Pulse: 80  Resp: 20  Temp: 98.7 F (37.1 C)  TempSrc: Oral  SpO2: 97%  Weight: 240 lb (108.9 kg)  Height: 5' 4.5" (1.638 m)    GENERAL: The patient is a well-nourished male, in no acute distress. The vital signs are documented above. CARDIOVASCULAR: 2+ radial pulses  bilaterally.  PULMONARY: There is good air exchange  ABDOMEN: Soft and non-tender  MUSCULOSKELETAL: There are no major deformities or cyanosis. NEUROLOGIC: No focal weakness or paresthesias are detected. SKIN: There are no ulcers or rashes noted. PSYCHIATRIC: The patient has a normal affect.  DATA:  He did undergo upper extremity arterial and venous studies. This reveals normal triphasic waveforms at the brachial, radial and ulnar arteries. His veins are moderate size with vein in the right antecubital space approximately 5 mm and ranging from 4-5 mm throughout the upper arm. Did appear to have branching pattern in the distal right forearm  MEDICAL ISSUES: Had long discussion regarding access for hemodialysis. Explained options of catheter for acute dialysis and AV grafts and AV fistulas with the benefits and limitations of both of these. I have recommended a right arm AV fistula creation since this is his nondominant hand. I think this is an outpatient procedure and also explain the possibility of possible non-maturation of this fistula. He understands and will proceed assuming is a possible are operative schedule   [name], MD [facility] V[facility]in Specialists of [facility] Tel [phone] Pager [phone]

## 2016-08-25 ENCOUNTER — Encounter (HOSPITAL_COMMUNITY): Payer: Self-pay | Admitting: *Deleted

## 2016-08-25 NOTE — Progress Notes (Signed)
Spoke with pt for pre-op call. Pt denies cardiac history, chest pain or sob. Pt is not diabetic.

## 2016-08-27 ENCOUNTER — Ambulatory Visit (HOSPITAL_COMMUNITY)
Admission: RE | Admit: 2016-08-27 | Discharge: 2016-08-27 | Disposition: A | Discharge status: Home / Self Care | Payer: Commercial Managed Care - PPO | Source: Ambulatory Visit | Attending: Vascular Surgery | Admitting: Vascular Surgery

## 2016-08-27 ENCOUNTER — Ambulatory Visit (HOSPITAL_COMMUNITY): Payer: Commercial Managed Care - PPO | Admitting: Anesthesiology

## 2016-08-27 ENCOUNTER — Encounter (HOSPITAL_COMMUNITY)
Admission: RE | Disposition: A | Discharge status: Home / Self Care | Payer: Self-pay | Source: Ambulatory Visit | Attending: Vascular Surgery

## 2016-08-27 ENCOUNTER — Encounter (HOSPITAL_COMMUNITY): Payer: Self-pay | Admitting: Anesthesiology

## 2016-08-27 DIAGNOSIS — N184 Chronic kidney disease, stage 4 (severe): Secondary | ICD-10-CM | POA: Diagnosis not present

## 2016-08-27 DIAGNOSIS — I129 Hypertensive chronic kidney disease with stage 1 through stage 4 chronic kidney disease, or unspecified chronic kidney disease: Secondary | ICD-10-CM | POA: Diagnosis not present

## 2016-08-27 DIAGNOSIS — Z79899 Other long term (current) drug therapy: Secondary | ICD-10-CM | POA: Insufficient documentation

## 2016-08-27 DIAGNOSIS — N185 Chronic kidney disease, stage 5: Secondary | ICD-10-CM | POA: Diagnosis not present

## 2016-08-27 HISTORY — DX: Unspecified osteoarthritis, unspecified site: M19.90

## 2016-08-27 HISTORY — DX: Pneumonia, unspecified organism: J18.9

## 2016-08-27 HISTORY — PX: AV FISTULA PLACEMENT: SHX1204

## 2016-08-27 LAB — POCT I-STAT 4, (NA,K, GLUC, HGB,HCT)
Glucose, Bld: 115 mg/dL — ABNORMAL HIGH (ref 65–99)
HEMATOCRIT: 33 % — AB (ref 39.0–52.0)
HEMOGLOBIN: 11.2 g/dL — AB (ref 13.0–17.0)
Potassium: 4 mmol/L (ref 3.5–5.1)
Sodium: 143 mmol/L (ref 135–145)

## 2016-08-27 SURGERY — ARTERIOVENOUS (AV) FISTULA CREATION
Anesthesia: Monitor Anesthesia Care | Site: Arm Lower | Laterality: Right

## 2016-08-27 MED ORDER — PROPOFOL 10 MG/ML IV BOLUS
INTRAVENOUS | Status: AC
  Filled 2016-08-27: qty 40

## 2016-08-27 MED ORDER — FENTANYL CITRATE (PF) 100 MCG/2ML IJ SOLN
INTRAMUSCULAR | Status: AC
  Filled 2016-08-27: qty 2

## 2016-08-27 MED ORDER — LIDOCAINE-EPINEPHRINE 1 %-1:100000 IJ SOLN
INTRAMUSCULAR | Status: AC
  Filled 2016-08-27: qty 1

## 2016-08-27 MED ORDER — OXYCODONE HCL 5 MG PO TABS: 5.0000 mg | ORAL_TABLET | Freq: Once | ORAL | Status: DC | PRN

## 2016-08-27 MED ORDER — PROPOFOL 10 MG/ML IV BOLUS
INTRAVENOUS | Status: AC
  Filled 2016-08-27: qty 20

## 2016-08-27 MED ORDER — FENTANYL CITRATE (PF) 100 MCG/2ML IJ SOLN: 25.0000 ug | INTRAMUSCULAR | Status: DC | PRN

## 2016-08-27 MED ORDER — LIDOCAINE HCL (PF) 1 % IJ SOLN
INTRAMUSCULAR | Status: AC
  Filled 2016-08-27: qty 30

## 2016-08-27 MED ORDER — HYDRALAZINE HCL 50 MG PO TABS: 50.0000 mg | ORAL_TABLET | Freq: Every day | ORAL | Status: DC

## 2016-08-27 MED ORDER — CEFUROXIME SODIUM 1.5 G IJ SOLR
1.5000 g | INTRAMUSCULAR | Status: AC
  Administered 2016-08-27: 1.5 g via INTRAVENOUS

## 2016-08-27 MED ORDER — ISOSORBIDE MONONITRATE ER 60 MG PO TB24: 60.0000 mg | ORAL_TABLET | Freq: Every day | ORAL | Status: DC

## 2016-08-27 MED ORDER — SODIUM CHLORIDE 0.9 % IV SOLN
INTRAVENOUS | Status: DC | PRN
  Administered 2016-08-27: 09:00:00

## 2016-08-27 MED ORDER — CHLORHEXIDINE GLUCONATE CLOTH 2 % EX PADS: 6.0000 | MEDICATED_PAD | Freq: Once | CUTANEOUS | Status: DC

## 2016-08-27 MED ORDER — ONDANSETRON HCL 4 MG/2ML IJ SOLN
INTRAMUSCULAR | Status: AC
  Filled 2016-08-27: qty 2

## 2016-08-27 MED ORDER — LIDOCAINE 2% (20 MG/ML) 5 ML SYRINGE
INTRAMUSCULAR | Status: AC
  Filled 2016-08-27: qty 5

## 2016-08-27 MED ORDER — DEXTROSE 5 % IV SOLN
INTRAVENOUS | Status: AC
  Filled 2016-08-27: qty 1.5

## 2016-08-27 MED ORDER — FENTANYL CITRATE (PF) 100 MCG/2ML IJ SOLN
INTRAMUSCULAR | Status: DC | PRN
  Administered 2016-08-27 (×2): 50 ug via INTRAVENOUS

## 2016-08-27 MED ORDER — ONDANSETRON HCL 4 MG/2ML IJ SOLN: 4.0000 mg | Freq: Once | INTRAMUSCULAR | Status: DC | PRN

## 2016-08-27 MED ORDER — PROPOFOL 500 MG/50ML IV EMUL
INTRAVENOUS | Status: DC | PRN
  Administered 2016-08-27: 50 ug/kg/min via INTRAVENOUS

## 2016-08-27 MED ORDER — MIDAZOLAM HCL 5 MG/5ML IJ SOLN
INTRAMUSCULAR | Status: DC | PRN
  Administered 2016-08-27 (×2): 1 mg via INTRAVENOUS

## 2016-08-27 MED ORDER — LIDOCAINE-EPINEPHRINE 1 %-1:100000 IJ SOLN
INTRAMUSCULAR | Status: DC | PRN
  Administered 2016-08-27: 5 mL

## 2016-08-27 MED ORDER — OXYCODONE HCL 5 MG/5ML PO SOLN
5.0000 mg | Freq: Once | ORAL | Status: DC | PRN
Start: 2016-08-27 — End: 2016-08-27

## 2016-08-27 MED ORDER — LIDOCAINE HCL (CARDIAC) 20 MG/ML IV SOLN
INTRAVENOUS | Status: DC | PRN
  Administered 2016-08-27: 50 mg via INTRAVENOUS

## 2016-08-27 MED ORDER — 0.9 % SODIUM CHLORIDE (POUR BTL) OPTIME
TOPICAL | Status: DC | PRN
  Administered 2016-08-27: 1000 mL

## 2016-08-27 MED ORDER — SODIUM CHLORIDE 0.9 % IV SOLN
INTRAVENOUS | Status: DC
  Administered 2016-08-27: 08:00:00 via INTRAVENOUS

## 2016-08-27 MED ORDER — OXYCODONE-ACETAMINOPHEN 5-325 MG PO TABS: 1.0000 | ORAL_TABLET | Freq: Four times a day (QID) | ORAL | 0 refills | Status: DC | PRN

## 2016-08-27 MED ORDER — MIDAZOLAM HCL 2 MG/2ML IJ SOLN
INTRAMUSCULAR | Status: AC
  Filled 2016-08-27: qty 2

## 2016-08-27 SURGICAL SUPPLY — 29 items
ADH SKN CLS APL DERMABOND .7 (GAUZE/BANDAGES/DRESSINGS) ×1
ARMBAND PINK RESTRICT EXTREMIT (MISCELLANEOUS) ×6 IMPLANT
CANISTER SUCTION 2500CC (MISCELLANEOUS) ×3 IMPLANT
CANNULA VESSEL 3MM 2 BLNT TIP (CANNULA) ×3 IMPLANT
CLIP LIGATING EXTRA MED SLVR (CLIP) ×3 IMPLANT
CLIP LIGATING EXTRA SM BLUE (MISCELLANEOUS) ×3 IMPLANT
COVER PROBE W GEL 5X96 (DRAPES) ×3 IMPLANT
DECANTER SPIKE VIAL GLASS SM (MISCELLANEOUS) ×3 IMPLANT
DERMABOND ADVANCED (GAUZE/BANDAGES/DRESSINGS) ×2
DERMABOND ADVANCED .7 DNX12 (GAUZE/BANDAGES/DRESSINGS) ×1 IMPLANT
ELECT REM PT RETURN 9FT ADLT (ELECTROSURGICAL) ×3
ELECTRODE REM PT RTRN 9FT ADLT (ELECTROSURGICAL) ×1 IMPLANT
GAUZE SPONGE 4X4 12PLY STRL (GAUZE/BANDAGES/DRESSINGS) ×3 IMPLANT
GEL ULTRASOUND 20GR AQUASONIC (MISCELLANEOUS) IMPLANT
GLOVE SS BIOGEL STRL SZ 7.5 (GLOVE) ×1 IMPLANT
GLOVE SUPERSENSE BIOGEL SZ 7.5 (GLOVE) ×2
GOWN STRL REUS W/ TWL LRG LVL3 (GOWN DISPOSABLE) ×3 IMPLANT
GOWN STRL REUS W/TWL LRG LVL3 (GOWN DISPOSABLE) ×9
KIT BASIN OR (CUSTOM PROCEDURE TRAY) ×3 IMPLANT
KIT ROOM TURNOVER OR (KITS) ×3 IMPLANT
LIQUID BAND (GAUZE/BANDAGES/DRESSINGS) ×3 IMPLANT
NS IRRIG 1000ML POUR BTL (IV SOLUTION) ×3 IMPLANT
PACK CV ACCESS (CUSTOM PROCEDURE TRAY) ×3 IMPLANT
PAD ARMBOARD 7.5X6 YLW CONV (MISCELLANEOUS) ×6 IMPLANT
SUT PROLENE 6 0 CC (SUTURE) ×3 IMPLANT
SUT VIC AB 3-0 SH 27 (SUTURE) ×3
SUT VIC AB 3-0 SH 27X BRD (SUTURE) ×1 IMPLANT
UNDERPAD 30X30 (UNDERPADS AND DIAPERS) ×3 IMPLANT
WATER STERILE IRR 1000ML POUR (IV SOLUTION) ×3 IMPLANT

## 2016-08-27 NOTE — Transfer of Care (Addendum)
Immediate Anesthesia Transfer of Care Note  Patient: Colin [name]  Procedure(s) Performed: Procedure(s): RIGHT RADIOCEPHALIC ARTERIOVENOUS FISTULA CREATION (Right)  Patient Location: PACU  Anesthesia Type:MAC  Level of Consciousness: awake, alert  and patient cooperative  Airway & Oxygen Therapy: Patient Spontanous Breathing and Patient connected to nasal cannula oxygen  Post-op Assessment: Report given to RN, Post -op Vital signs reviewed and stable and Patient moving all extremities  Post vital signs: Reviewed and stable  Last Vitals:  Vitals:   08/27/16 0621 08/27/16 0708  BP: (!) 143/102 (!) 146/92  Pulse: 79   Resp: 20   Temp: 36.8 C     Last Pain:  Vitals:   08/27/16 0621  TempSrc: Oral         Complications: No apparent anesthesia complications

## 2016-08-27 NOTE — Anesthesia Postprocedure Evaluation (Signed)
Anesthesia Post Note  Patient: Colin [name]  Procedure(s) Performed: Procedure(s) (LRB): RIGHT RADIOCEPHALIC ARTERIOVENOUS FISTULA CREATION (Right)  Patient location during evaluation: PACU Anesthesia Type: MAC Level of consciousness: awake and awake and alert Pain management: pain level controlled Vital Signs Assessment: vitals unstable and post-procedure vital signs reviewed and stable Respiratory status: nonlabored ventilation, spontaneous breathing, respiratory function stable and patient connected to nasal cannula oxygen Cardiovascular status: blood pressure returned to baseline Anesthetic complications: no    Last Vitals:  Vitals:   08/27/16 0908 08/27/16 0915  BP: 109/79 113/80  Pulse: 76 75  Resp: (!) 21 18  Temp: 36.7 C     Last Pain:  Vitals:   08/27/16 0908  TempSrc:   PainSc: 0-No pain                 Owais Pruett COKER

## 2016-08-27 NOTE — H&P (Signed)
Office Visit   07/27/2016 [facility]ists -Sharman Crate, MD  Vascular Surgery   Chronic renal insufficiency, stage 4 (severe) [facility])  Dx   New Evaluation ; Referred by Gaynelle Arabian, MD  Reason for Visit   Additional Documentation   Vitals:   BP 139/89 (BP Location: Left Arm, Patient Position: Sitting, Cuff Size: Large)   Pulse 80   Temp 98.7 F (37.1 C) (Oral)   Resp 20   Ht 5' 4.5" (1.638 m)   Wt 240 lb (108.9 kg)   SpO2 97%   BMI 40.56 kg/m   BSA 2.23 m   Flowsheets:   Infectious Disease Screening,   Healthcare Directives,   Amb Nursing Assessment,   Custom Formula Data,   Vital Signs,   MEWS Score,   Anthropometrics     Encounter Info:   Billing Info,   History,   Allergies,   Detailed Report     All Notes   Progress Notes by [name], MD at 07/27/2016 1:00 PM   Author: [name], MD Author Type: Physician Filed: 07/27/2016 2:05 PM  Note Status: Signed Cosign: Cosign Not Required Encounter Date: 07/27/2016 1:00 PM  Editor: [name], MD (Physician)                                        Vascular and Vein Specialist of [facility]  Patient name: Colin [name]  MRN: [id number]        DOB: 1965-11-13        Sex: male  REASON FOR CONSULT: Relation for AV access for hemodialysis  HPI: Colin [name] is a 50 y.o. male, who is in today for a hemodialysis access. He is not on dialysis currently. He has had progressive renal insufficiency is left-handed. He does not have any uremic symptoms. Does have lower extremity edema. No prior history of cardiac disease or peripheral vascular occlusive disease      Past Medical History:  Diagnosis Date  . Anemia    iron deficiency  . CKD (chronic kidney disease) stage 3, GFR 30-59 ml/min   . History of chicken pox   . Hypertension   . Melanosis coli   . Mumps   . Ulcerative colitis [place], [place]         Family History  Problem Relation Age of  Onset  . Hypertension Father     SOCIAL HISTORY: Social History        Social History  . Marital status: Married    Spouse name: N/A  . Number of children: N/A  . Years of education: N/A      Occupational History  . Not on file.         Social History Main Topics  . Smoking status: Never Smoker  . Smokeless tobacco: Never Used  . Alcohol use Yes     Comment: moderate  . Drug use: No  . Sexual activity: Not on file       Other Topics Concern  . Not on file      Social History Narrative  . No narrative on file    No Known Allergies        Current Outpatient Prescriptions  Medication Sig Dispense Refill  . allopurinol (ZYLOPRIM) 300 MG tablet Take 600 mg by mouth daily.     Marland Kitchen atenolol (  TENORMIN) 100 MG tablet Take 100 mg by mouth daily.      . colchicine 0.6 MG tablet Take 0.6 mg by mouth daily as needed (gout).     . cyclobenzaprine (FLEXERIL) 5 MG tablet Take 1 tablet (5 mg total) by mouth 3 (three) times daily as needed for muscle spasms. 30 tablet 0  . diclofenac (CATAFLAM) 50 MG tablet Take 1 tablet (50 mg total) by mouth 3 (three) times daily. 30 tablet 0  . furosemide (LASIX) 80 MG tablet Take 160 mg by mouth every evening.    . hydrALAZINE (APRESOLINE) 50 MG tablet Take 1 tablet (50 mg total) by mouth 3 (three) times daily as needed. 90 tablet 6  . isosorbide mononitrate (IMDUR) 60 MG 24 hr tablet Take 1 tablet (60 mg total) by mouth daily. 90 tablet 4  . pravastatin (PRAVACHOL) 40 MG tablet Take 40 mg by mouth daily.    . predniSONE (DELTASONE) 20 MG tablet Two daily with food 10 tablet 0   No current facility-administered medications for this visit.     REVIEW OF SYSTEMS:  [X]  denotes positive finding, [ ]  denotes negative finding Cardiac  Comments:  Chest pain or chest pressure:    Shortness of breath upon exertion:    Short of breath when lying flat:    Irregular heart rhythm:        Vascular    Pain  in calf, thigh, or hip brought on by ambulation:    Pain in feet at night that wakes you up from your sleep:     Blood clot in your veins:    Leg swelling:         Pulmonary    Oxygen at home:    Productive cough:     Wheezing:         Neurologic    Sudden weakness in arms or legs:     Sudden numbness in arms or legs:     Sudden onset of difficulty speaking or slurred speech:    Temporary loss of vision in one eye:     Problems with dizziness:         Gastrointestinal    Blood in stool:     Vomited blood:         Genitourinary    Burning when urinating:     Blood in urine:        Psychiatric    Major depression:         Hematologic    Bleeding problems:    Problems with blood clotting too easily:        Skin    Rashes or ulcers:        Constitutional    Fever or chills:      PHYSICAL EXAM:    Vitals:   07/27/16 1327  BP: 139/89  Pulse: 80  Resp: 20  Temp: 98.7 F (37.1 C)  TempSrc: Oral  SpO2: 97%  Weight: 240 lb (108.9 kg)  Height: 5' 4.5" (1.638 m)    GENERAL: The patient is a well-nourished male, in no acute distress. The vital signs are documented above. CARDIOVASCULAR: 2+ radial pulses bilaterally.  PULMONARY: There is good air exchange  ABDOMEN: Soft and non-tender  MUSCULOSKELETAL: There are no major deformities or cyanosis. NEUROLOGIC: No focal weakness or paresthesias are detected. SKIN: There are no ulcers or rashes noted. PSYCHIATRIC: The patient has a normal affect.  DATA:  He did undergo upper extremity arterial and venous studies. This  reveals normal triphasic waveforms at the brachial, radial and ulnar arteries. His veins are moderate size with vein in the right antecubital space approximately 5 mm and ranging from 4-5 mm throughout the upper arm. Did appear to have branching pattern in the distal right forearm  MEDICAL ISSUES: Had long discussion  regarding access for hemodialysis. Explained options of catheter for acute dialysis and AV grafts and AV fistulas with the benefits and limitations of both of these. I have recommended a right arm AV fistula creation since this is his nondominant hand. I think this is an outpatient procedure and also explain the possibility of possible non-maturation of this fistula. He understands and will proceed assuming is a possible are operative schedule   [name], MD FACS [facility] of [facility] Tel [phone] Pager [phone]       Communications      [facility] Provider CC Chart Rep sent to Gaynelle Arabian, MD  Media   Electronic signature on 07/27/2016 12:05 PM   Orders Placed    None  Medication Changes     None    Medication List  Visit Diagnoses      Chronic renal insufficiency, stage 4 (severe) ([place])    Problem List  Level of Service   Level of Service  PR OFFICE OUTPATIENT NEW 60 MINUTES [99204]  LOS History  All Charges for This Encounter   Code  33545  Description: PR OFFICE OUTPATIENT NEW 62 MINUTES  Service Date: 07/27/2016  Service Provider: [name], MD  Qty: 1   Addendum:  The patient has been re-examined and re-evaluated.  The patient's history and physical has been reviewed and is unchanged.    Colin [name] is a 50 y.o. male is being admitted with Stage V Chronic Kidney Disease N18.5. All the risks, benefits and other treatment options have been discussed with the patient. The patient has consented to proceed with Procedure(s): ARTERIOVENOUS (AV) FISTULA CREATION as a surgical intervention.  Curt Jews 08/27/2016 6:57 AM Vascular and Vein Surgery

## 2016-08-27 NOTE — Progress Notes (Signed)
   08/27/16 0710  OBSTRUCTIVE SLEEP APNEA  Have you ever been diagnosed with sleep apnea through a sleep study? No  Do you snore loudly (loud enough to be heard through closed doors)?  0  Do you often feel tired, fatigued, or sleepy during the daytime (such as falling asleep during driving or talking to someone)? 0  Has anyone observed you stop breathing during your sleep? 0  Do you have, or are you being treated for high blood pressure? 1  BMI more than 35 kg/m2? 1  Age > 50 (1-yes) 1  Neck circumference greater than:Male 16 inches or larger, Male 17inches or larger? 1  Male Gender (Yes=1) 1  Obstructive Sleep Apnea Score 5

## 2016-08-27 NOTE — Anesthesia Preprocedure Evaluation (Addendum)
Anesthesia Evaluation  Patient identified by MRN, date of birth, ID band Patient awake    Reviewed: Allergy & Precautions, NPO status , Patient's Chart, lab work & pertinent test results  Airway Mallampati: II  TM Distance: >3 FB Neck ROM: Full    Dental  (+) Teeth Intact, Dental Advisory Given   Pulmonary    breath sounds clear to auscultation       Cardiovascular hypertension, Pt. on medications and Pt. on home beta blockers  Rhythm:Regular Rate:Normal     Neuro/Psych    GI/Hepatic   Endo/Other    Renal/GU CRFRenal diseaseNot yet on dialysis     Musculoskeletal   Abdominal   Peds  Hematology   Anesthesia Other Findings   Reproductive/Obstetrics                           Anesthesia Physical Anesthesia Plan  ASA: III  Anesthesia Plan: MAC   Post-op Pain Management:    Induction: Intravenous  Airway Management Planned: Natural Airway and Simple Face Mask  Additional Equipment:   Intra-op Plan:   Post-operative Plan:   Informed Consent: I have reviewed the patients History and Physical, chart, labs and discussed the procedure including the risks, benefits and alternatives for the proposed anesthesia with the patient or authorized representative who has indicated his/her understanding and acceptance.   Dental advisory given  Plan Discussed with: CRNA and Anesthesiologist  Anesthesia Plan Comments:        Anesthesia Quick Evaluation

## 2016-08-27 NOTE — Op Note (Signed)
    OPERATIVE REPORT  DATE OF SURGERY: 08/27/2016  PATIENT: Colin [name], 50 y.o. male MRN: [id number]  DOB: November 29, 1965  PRE-OPERATIVE DIAGNOSIS: Chronic renal insufficiency  POST-OPERATIVE DIAGNOSIS:  Same  PROCEDURE: Right radiocephalic AV fistula  SURGEON:  Curt Jews, M.D.  PHYSICIAN ASSISTANT: Pervis Hocking [facility]  ANESTHESIA:  Local with sedation  EBL: Minimal ml  Total I/O In: 550 [I.V.:500; IV Piggyback:50] Out: 3 [Blood:3]  BLOOD ADMINISTERED: None  DRAINS: None  SPECIMEN: None  COUNTS CORRECT:  YES  PLAN OF CARE: PACU   PATIENT DISPOSITION:  PACU - hemodynamically stable  PROCEDURE DETAILS: The patient was taken to the operative placed supine position where the area of the right arm prepped draped in usual fashion. SonoSite ultrasound revealed good caliber cephalic vein at the level of the wrist and forearm. Incision was made using local anesthesia between the level of the cephalic vein and the radial artery. The cephalic vein was mobilized very large caliber. Mobilize further proximally and there was a large branch was ligated with 3-0 silk ties and divided. The vein was mobilized distally and was ligated distally and divided. The vein was brought into approximation with the radial artery which was also of good caliber. The artery was occluded proximally and distally and was opened 11 blade some long-standing with Potts scissors. The vein was cut to appropriate length and was spatulated and sewn end-to-side to the artery with a running 6-0 Prolene suture. Clamps removed and good thrill was noted. The wound irrigated with saline. Hemostasis tablet cautery. Wounds were closed with 3-0 Vicryl in the subcutaneous and subcuticular tissue. Dermabond was applied and the patient was transferred to the recovery room in stable condition   [name], M.D., [facility] 08/27/2016 9:16 AM

## 2016-08-28 ENCOUNTER — Encounter (HOSPITAL_COMMUNITY): Payer: Self-pay | Admitting: Vascular Surgery

## 2016-08-30 ENCOUNTER — Telehealth: Payer: Self-pay

## 2016-08-30 ENCOUNTER — Telehealth: Payer: Self-pay | Admitting: Vascular Surgery

## 2016-08-30 NOTE — Telephone Encounter (Signed)
Sched 09/29/16 at 4:00 and MD 10/05/16 at 8:30. Spoke to pt to inform them of appt.

## 2016-08-30 NOTE — Telephone Encounter (Signed)
Phone call from pt.  Reported swelling of right hand and forearm.  Denied any numbness/ tingling of fingers of right hand.  Denied redness surrounding the incision. Reported a small amt. of thin, light pink, bloody drainage from the incisional area, intermittently.  Denied fever.  Denied any separation of the incision.  Stated he is at work today, and has not been able to elevate the arm, yet, today, but did elevate it over the weekend.  Encouraged to elevate the arm above level of heart, at intervals, when able to.  Encouraged to monitor for any signs of infection; increased redness, heat, worsening drainage, or opening of the incision.  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  Advised will send a letter supporting this, to his employer; the pt. requested to send this through his email.  The pt. verb. understanding of instructions.

## 2016-08-30 NOTE — Telephone Encounter (Signed)
-----   Message from Mena Goes, RN sent at 08/27/2016  9:19 AM EST ----- Regarding: 4-6 weeks with duplex   ----- Message ----- From: Alvia Grove, PA-C Sent: 08/27/2016   8:58 AM To: Vvs Charge Pool  S/p right radial cephalic AV fistula 70/78/67  F/u with Dr. Donnetta Hutching in 4-6 weeks with duplex  Thanks Maudie Mercury

## 2016-08-31 ENCOUNTER — Telehealth: Payer: Self-pay

## 2016-08-31 NOTE — Telephone Encounter (Signed)
Attempted to return call to pt. After receiving voice message on Triage line of throbbing pain in right AVF site.  Left voice message to call back to discuss symptoms further.

## 2016-09-02 NOTE — Telephone Encounter (Signed)
Contacted pt. to check on symptoms.  Reported the throbbing that he reported on Tues. has eased up.  Stated "I must have slept on it wrong."  Reported "there is still soreness and tenderness in the incision, but it is a lot better."  Advised to call the office if any concerns.  Reminded of f/u appt. 12/13 for right arm duplex, and of appt. with Dr. Donnetta Hutching on 12/19.  Verb. understanding.

## 2016-09-27 ENCOUNTER — Other Ambulatory Visit: Payer: Self-pay | Admitting: *Deleted

## 2016-09-27 DIAGNOSIS — N186 End stage renal disease: Secondary | ICD-10-CM

## 2016-09-27 DIAGNOSIS — Z4931 Encounter for adequacy testing for hemodialysis: Secondary | ICD-10-CM

## 2016-09-28 ENCOUNTER — Encounter: Payer: Self-pay | Admitting: Vascular Surgery

## 2016-09-29 ENCOUNTER — Ambulatory Visit (HOSPITAL_COMMUNITY)
Admission: RE | Admit: 2016-09-29 | Discharge: 2016-09-29 | Disposition: A | Discharge status: Home / Self Care | Payer: Commercial Managed Care - PPO | Source: Ambulatory Visit | Attending: Vascular Surgery | Admitting: Vascular Surgery

## 2016-09-29 ENCOUNTER — Other Ambulatory Visit: Payer: Self-pay | Admitting: Cardiovascular Disease

## 2016-09-29 DIAGNOSIS — N186 End stage renal disease: Secondary | ICD-10-CM

## 2016-09-29 DIAGNOSIS — Z4931 Encounter for adequacy testing for hemodialysis: Secondary | ICD-10-CM | POA: Diagnosis not present

## 2016-10-01 ENCOUNTER — Other Ambulatory Visit: Payer: Self-pay | Admitting: Family Medicine

## 2016-10-01 DIAGNOSIS — R011 Cardiac murmur, unspecified: Secondary | ICD-10-CM

## 2016-10-05 ENCOUNTER — Encounter: Payer: Commercial Managed Care - PPO | Admitting: Vascular Surgery

## 2016-10-08 ENCOUNTER — Encounter: Payer: Self-pay | Admitting: Vascular Surgery

## 2016-10-26 ENCOUNTER — Other Ambulatory Visit (HOSPITAL_COMMUNITY): Payer: Commercial Managed Care - PPO

## 2016-10-26 ENCOUNTER — Ambulatory Visit (INDEPENDENT_AMBULATORY_CARE_PROVIDER_SITE_OTHER): Payer: Self-pay | Admitting: Vascular Surgery

## 2016-10-26 ENCOUNTER — Other Ambulatory Visit: Payer: Self-pay

## 2016-10-26 ENCOUNTER — Encounter: Payer: Self-pay | Admitting: Vascular Surgery

## 2016-10-26 VITALS — BP 146/93 | HR 71 | Temp 98.7°F | Resp 20 | Ht 64.5 in | Wt 250.3 lb

## 2016-10-26 DIAGNOSIS — N184 Chronic kidney disease, stage 4 (severe): Secondary | ICD-10-CM

## 2016-10-26 NOTE — Progress Notes (Signed)
y                                    Patient name: Colin [name] MRN: [id number] DOB: Feb 28, 1966 Sex: male  REASON FOR VISIT: Follow-up right radiocephalic fistula  HPI: Colin [name] is a 51 y.o. male here today for follow-up.  Had right radiocephalic fistula creation on 08/27/2016.  He is stage IV kidney disease.  Not yet on hemodialysis.  He had no difficulty associated with the procedure  Current Outpatient Prescriptions  Medication Sig Dispense Refill  . allopurinol (ZYLOPRIM) 300 MG tablet Take 600 mg by mouth at bedtime.     Marland Kitchen atenolol (TENORMIN) 100 MG tablet Take 100 mg by mouth at bedtime.     . furosemide (LASIX) 80 MG tablet Take 160 mg by mouth every evening.    . hydrALAZINE (APRESOLINE) 50 MG tablet Take 1 tablet (50 mg total) by mouth at bedtime.    . hydrALAZINE (APRESOLINE) 50 MG tablet TAKE 1 TABLET BY MOUTH 3 TIMES A DAY AS NEEDED 30 tablet 0  . isosorbide mononitrate (IMDUR) 60 MG 24 hr tablet Take 1 tablet (60 mg total) by mouth at bedtime.    . pravastatin (PRAVACHOL) 40 MG tablet Take 40 mg by mouth every evening.     Marland Kitchen oxyCODONE-acetaminophen (PERCOCET/ROXICET) 5-325 MG tablet Take 1 tablet by mouth every 6 (six) hours as needed. (Patient not taking: Reported on 10/26/2016) 6 tablet 0   No current facility-administered medications for this visit.      PHYSICAL EXAM: Vitals:   10/26/16 0821 10/26/16 0822  BP: (!) 144/91 (!) 146/93  Pulse: 71   Resp: 20   Temp: 98.7 F (37.1 C)   TempSrc: Oral   SpO2: 97%   Weight: 250 lb 4.8 oz (113.5 kg)   Height: 5' 4.5" (1.638 m)     GENERAL: The patient is a well-nourished male, in no acute distress. The vital signs are documented above. He does have moderate keloid formation at the incision between the cephalic vein and radial artery just above his wrist.  Does have an excellent thrill.  His vein does run rather deep under the surface  I reviewed his duplex from 09/29/2016.  This shows a very nice early size maturation  with his fistula being approximate 6 mm throughout its course.   I imaged his vein with the SonoSite ultrasound myself.  Does have a nice caliber throughout.  He does run rather deep under the subcutaneous fat and fascia.  MEDICAL ISSUES: Nice size maturation 2 months out from her AV fistula creation.  I do feel that his vein runs too deep for access.  Explained that he will require superficial mobilization of the cephalic vein to be able to be used for access.  With his moderate keloid formation I explained that in all likelihood he will have this happen at his incisions throughout his forearm.  Explained he would have several incisions to mobilize the cephalic vein to directly under the surface.  Understands and wished to proceed as an outpatient at his earliest [place]. Early, MD [facility] Tel [phone] Pager [phone]

## 2016-11-08 ENCOUNTER — Other Ambulatory Visit: Payer: Self-pay | Admitting: Cardiovascular Disease

## 2016-11-10 ENCOUNTER — Encounter (HOSPITAL_COMMUNITY): Payer: Self-pay | Admitting: *Deleted

## 2016-11-10 NOTE — Progress Notes (Signed)
Pt denies SOB, chest pain, and being under the care of a cardiologist. Pt denies having any cardiac issues. Pt stated that MD ordered the echo because " he heard some rattling or something." Pt echo scheduled for 11/11/16, please review results DOS. Pt denies having a cardiac cath but stated that a stress test was performed > 6 years ago. Pt denies having an EKG and chest x ray within the last year. Pt made aware to stop taking vitamins, fish oil and herbal medications. Do not take any NSAIDs ie: Ibuprofen, Advil, Naproxen, BC, and Goody Powder. Pt verbalized understanding of all pre-op instructions.

## 2016-11-10 NOTE — Progress Notes (Signed)
   11/10/16 [facility]  Have you ever been diagnosed with sleep apnea through a sleep study? No  Do you snore loudly (loud enough to be heard through closed doors)?  0  Do you often feel tired, fatigued, or sleepy during the daytime (such as falling asleep during driving or talking to someone)? 0  Has anyone observed you stop breathing during your sleep? 0  Do you have, or are you being treated for high blood pressure? 1  BMI more than 35 kg/m2? 1  Age > 50 (1-yes) 1  Neck circumference greater than:Male 16 inches or larger, Male 17inches or larger? 1  Male Gender (Yes=1) 1  Obstructive Sleep Apnea Score 5

## 2016-11-11 ENCOUNTER — Other Ambulatory Visit (HOSPITAL_COMMUNITY): Payer: Commercial Managed Care - PPO

## 2016-11-12 ENCOUNTER — Encounter (HOSPITAL_COMMUNITY): Payer: Self-pay | Admitting: Urology

## 2016-11-12 ENCOUNTER — Ambulatory Visit (HOSPITAL_COMMUNITY): Payer: Commercial Managed Care - PPO | Admitting: Anesthesiology

## 2016-11-12 ENCOUNTER — Ambulatory Visit (HOSPITAL_COMMUNITY)
Admission: RE | Admit: 2016-11-12 | Discharge: 2016-11-12 | Disposition: A | Discharge status: Home / Self Care | Payer: Commercial Managed Care - PPO | Source: Ambulatory Visit | Attending: Vascular Surgery | Admitting: Vascular Surgery

## 2016-11-12 ENCOUNTER — Encounter (HOSPITAL_COMMUNITY)
Admission: RE | Disposition: A | Discharge status: Home / Self Care | Payer: Self-pay | Source: Ambulatory Visit | Attending: Vascular Surgery

## 2016-11-12 DIAGNOSIS — N184 Chronic kidney disease, stage 4 (severe): Secondary | ICD-10-CM | POA: Insufficient documentation

## 2016-11-12 DIAGNOSIS — I129 Hypertensive chronic kidney disease with stage 1 through stage 4 chronic kidney disease, or unspecified chronic kidney disease: Secondary | ICD-10-CM | POA: Insufficient documentation

## 2016-11-12 DIAGNOSIS — N186 End stage renal disease: Secondary | ICD-10-CM

## 2016-11-12 DIAGNOSIS — Z79899 Other long term (current) drug therapy: Secondary | ICD-10-CM | POA: Diagnosis not present

## 2016-11-12 DIAGNOSIS — Y832 Surgical operation with anastomosis, bypass or graft as the cause of abnormal reaction of the patient, or of later complication, without mention of misadventure at the time of the procedure: Secondary | ICD-10-CM | POA: Diagnosis not present

## 2016-11-12 DIAGNOSIS — Z6841 Body Mass Index (BMI) 40.0 and over, adult: Secondary | ICD-10-CM | POA: Diagnosis not present

## 2016-11-12 DIAGNOSIS — T82590A Other mechanical complication of surgically created arteriovenous fistula, initial encounter: Secondary | ICD-10-CM | POA: Diagnosis not present

## 2016-11-12 DIAGNOSIS — T82898A Other specified complication of vascular prosthetic devices, implants and grafts, initial encounter: Secondary | ICD-10-CM | POA: Diagnosis not present

## 2016-11-12 DIAGNOSIS — E785 Hyperlipidemia, unspecified: Secondary | ICD-10-CM | POA: Diagnosis not present

## 2016-11-12 HISTORY — DX: Gout, unspecified: M10.9

## 2016-11-12 HISTORY — PX: FISTULA SUPERFICIALIZATION: SHX6341

## 2016-11-12 LAB — POCT I-STAT 4, (NA,K, GLUC, HGB,HCT)
Glucose, Bld: 102 mg/dL — ABNORMAL HIGH (ref 65–99)
HCT: 31 % — ABNORMAL LOW (ref 39.0–52.0)
Hemoglobin: 10.5 g/dL — ABNORMAL LOW (ref 13.0–17.0)
Potassium: 3.9 mmol/L (ref 3.5–5.1)
Sodium: 140 mmol/L (ref 135–145)

## 2016-11-12 SURGERY — FISTULA SUPERFICIALIZATION
Anesthesia: General | Site: Arm Lower | Laterality: Right

## 2016-11-12 MED ORDER — SODIUM CHLORIDE 0.9 % IV SOLN
INTRAVENOUS | Status: DC | PRN
  Administered 2016-11-12: 12:00:00

## 2016-11-12 MED ORDER — MEPERIDINE HCL 25 MG/ML IJ SOLN: 6.2500 mg | INTRAMUSCULAR | Status: DC | PRN

## 2016-11-12 MED ORDER — LIDOCAINE-EPINEPHRINE (PF) 1 %-1:200000 IJ SOLN
INTRAMUSCULAR | Status: AC
  Filled 2016-11-12: qty 30

## 2016-11-12 MED ORDER — OXYCODONE-ACETAMINOPHEN 5-325 MG PO TABS: 1.0000 | ORAL_TABLET | Freq: Four times a day (QID) | ORAL | 0 refills | Status: DC | PRN

## 2016-11-12 MED ORDER — CHLORHEXIDINE GLUCONATE CLOTH 2 % EX PADS: 6.0000 | MEDICATED_PAD | Freq: Once | CUTANEOUS | Status: DC

## 2016-11-12 MED ORDER — 0.9 % SODIUM CHLORIDE (POUR BTL) OPTIME
TOPICAL | Status: DC | PRN
  Administered 2016-11-12: 1000 mL

## 2016-11-12 MED ORDER — PROPOFOL 10 MG/ML IV BOLUS
INTRAVENOUS | Status: DC | PRN
Start: 2016-11-12 — End: 2016-11-12
  Administered 2016-11-12: 150 mg via INTRAVENOUS

## 2016-11-12 MED ORDER — FENTANYL CITRATE (PF) 100 MCG/2ML IJ SOLN
INTRAMUSCULAR | Status: AC
  Filled 2016-11-12: qty 2

## 2016-11-12 MED ORDER — FENTANYL CITRATE (PF) 100 MCG/2ML IJ SOLN
INTRAMUSCULAR | Status: DC | PRN
  Administered 2016-11-12: 25 ug via INTRAVENOUS
  Administered 2016-11-12: 50 ug via INTRAVENOUS
  Administered 2016-11-12: 25 ug via INTRAVENOUS

## 2016-11-12 MED ORDER — PROMETHAZINE HCL 25 MG/ML IJ SOLN
6.2500 mg | INTRAMUSCULAR | Status: DC | PRN
  Administered 2016-11-12: 6.25 mg via INTRAVENOUS

## 2016-11-12 MED ORDER — MIDAZOLAM HCL 2 MG/2ML IJ SOLN: 0.5000 mg | Freq: Once | INTRAMUSCULAR | Status: DC | PRN

## 2016-11-12 MED ORDER — DEXTROSE 5 % IV SOLN
1.5000 g | INTRAVENOUS | Status: AC
  Administered 2016-11-12: 1.5 g via INTRAVENOUS
  Filled 2016-11-12: qty 1.5

## 2016-11-12 MED ORDER — PROMETHAZINE HCL 25 MG RE SUPP: 25.0000 mg | Freq: Four times a day (QID) | RECTAL | 0 refills | Status: DC | PRN

## 2016-11-12 MED ORDER — SODIUM CHLORIDE 0.9 % IV SOLN: INTRAVENOUS | Status: DC

## 2016-11-12 MED ORDER — SODIUM CHLORIDE 0.9 % IV SOLN
INTRAVENOUS | Status: DC | PRN
  Administered 2016-11-12: 12:00:00 via INTRAVENOUS

## 2016-11-12 MED ORDER — FENTANYL CITRATE (PF) 100 MCG/2ML IJ SOLN: 25.0000 ug | INTRAMUSCULAR | Status: DC | PRN

## 2016-11-12 MED ORDER — EPHEDRINE SULFATE 50 MG/ML IJ SOLN
INTRAMUSCULAR | Status: DC | PRN
  Administered 2016-11-12: 5 mg via INTRAVENOUS
  Administered 2016-11-12 (×3): 10 mg via INTRAVENOUS

## 2016-11-12 MED ORDER — PROMETHAZINE HCL 25 MG/ML IJ SOLN
INTRAMUSCULAR | Status: AC
  Filled 2016-11-12: qty 1

## 2016-11-12 SURGICAL SUPPLY — 34 items
ADH SKN CLS APL DERMABOND .7 (GAUZE/BANDAGES/DRESSINGS) ×1
ARMBAND PINK RESTRICT EXTREMIT (MISCELLANEOUS) ×3 IMPLANT
CANISTER SUCTION 2500CC (MISCELLANEOUS) ×3 IMPLANT
CLIP LIGATING EXTRA MED SLVR (CLIP) ×3 IMPLANT
CLIP LIGATING EXTRA SM BLUE (MISCELLANEOUS) ×3 IMPLANT
COVER PROBE W GEL 5X96 (DRAPES) ×3 IMPLANT
DECANTER SPIKE VIAL GLASS SM (MISCELLANEOUS) ×3 IMPLANT
DERMABOND ADVANCED (GAUZE/BANDAGES/DRESSINGS) ×2
DERMABOND ADVANCED .7 DNX12 (GAUZE/BANDAGES/DRESSINGS) ×1 IMPLANT
ELECT REM PT RETURN 9FT ADLT (ELECTROSURGICAL) ×3
ELECTRODE REM PT RTRN 9FT ADLT (ELECTROSURGICAL) ×1 IMPLANT
GLOVE BIOGEL PI IND STRL 6.5 (GLOVE) ×3 IMPLANT
GLOVE BIOGEL PI IND STRL 7.0 (GLOVE) ×1 IMPLANT
GLOVE BIOGEL PI IND STRL 7.5 (GLOVE) ×1 IMPLANT
GLOVE BIOGEL PI INDICATOR 6.5 (GLOVE) ×6
GLOVE BIOGEL PI INDICATOR 7.0 (GLOVE) ×2
GLOVE BIOGEL PI INDICATOR 7.5 (GLOVE) ×2
GLOVE ECLIPSE 6.5 STRL STRAW (GLOVE) ×6 IMPLANT
GLOVE ECLIPSE 7.0 STRL STRAW (GLOVE) ×3 IMPLANT
GLOVE SS BIOGEL STRL SZ 7.5 (GLOVE) ×1 IMPLANT
GLOVE SUPERSENSE BIOGEL SZ 7.5 (GLOVE) ×2
GOWN STRL REUS W/ TWL LRG LVL3 (GOWN DISPOSABLE) ×5 IMPLANT
GOWN STRL REUS W/TWL LRG LVL3 (GOWN DISPOSABLE) ×15
KIT BASIN OR (CUSTOM PROCEDURE TRAY) ×3 IMPLANT
KIT ROOM TURNOVER OR (KITS) ×3 IMPLANT
NS IRRIG 1000ML POUR BTL (IV SOLUTION) ×3 IMPLANT
PACK CV ACCESS (CUSTOM PROCEDURE TRAY) ×3 IMPLANT
PAD ARMBOARD 7.5X6 YLW CONV (MISCELLANEOUS) ×6 IMPLANT
SUT PROLENE 6 0 CC (SUTURE) ×6 IMPLANT
SUT SILK 2 0 SH (SUTURE) ×3 IMPLANT
SUT VIC AB 3-0 SH 27 (SUTURE) ×3
SUT VIC AB 3-0 SH 27X BRD (SUTURE) ×1 IMPLANT
UNDERPAD 30X30 (UNDERPADS AND DIAPERS) ×3 IMPLANT
WATER STERILE IRR 1000ML POUR (IV SOLUTION) ×3 IMPLANT

## 2016-11-12 NOTE — Op Note (Signed)
    OPERATIVE REPORT  DATE OF SURGERY: 11/12/2016  PATIENT: Colin [name], 51 y.o. male MRN: [id number]  DOB: Dec 24, 1965  PRE-OPERATIVE DIAGNOSIS: Chronic renal insufficiency with poorly functioning right radiocephalic fistula  POST-OPERATIVE DIAGNOSIS:  Same  PROCEDURE: Translocation of cephalic vein radiocephalic fistula  SURGEON:  Curt Jews, M.D.  PHYSICIAN ASSISTANT: Gerri Lins PA-C  ANESTHESIA:  Gen.  EBL: Minimal ml  No intake/output data recorded.  BLOOD ADMINISTERED: None  DRAINS: None  SPECIMEN: None  COUNTS CORRECT:  YES  PLAN OF CARE: PACU   PATIENT DISPOSITION:  PACU - hemodynamically stable  PROCEDURE DETAILS: Patient was taken to the operative placed supine position where the area the right arm prepped in sterile fashion. The patient radiocephalic fistula was placed 2 months ago which had excellent size but had very deep location felt to be inadequate for long-term access. Incision was made to the prior brachiocephalic anastomosis and the second was incision midforearm and then a third incision at the antecubital space. The cephalic vein was mobilized circumferentially from the level of the wrist to the antecubital space. Tributary branches were ligated with 3-0 silk ties and divided. The vein was occluded at the arterial venous anastomosis and the vein was transected. The vein was brought out through the antecubital wound. The vein was gently dilated was of excellent caliber. The vein was brought through a subcutaneous tunnel with a 6 no meter Gore tunneler going from the wrist incision to the antecubital space and bringing the vein back through the subcuticular tunnel. The vein was cut to appropriate length and spatulated and sewn into into the old vein at the arterial venous anastomosis. This was a running 6-0 Prolene suture. Clamps removed and excellent thrill was noted. Wounds irrigated with saline. Hemostasis tablet cautery. Wounds were closed with 3-0  Vicryl in the subcutaneous and subcuticular tissue. Sterile dressing was applied the patient was transferred to the recovery room stable condition   [name], M.D., [facility] 11/12/2016 2:07 PM

## 2016-11-12 NOTE — Transfer of Care (Signed)
Immediate Anesthesia Transfer of Care Note  Patient: Colin [name]  Procedure(s) Performed: Procedure(s): FISTULA SUPERFICIALIZATION RIGHT FOREEARM (Right)  Patient Location: PACU  Anesthesia Type:MAC  Level of Consciousness: awake and alert   Airway & Oxygen Therapy: Patient Spontanous Breathing and Patient connected to nasal cannula oxygen  Post-op Assessment: Report given to RN and Post -op Vital signs reviewed and stable  Post vital signs: Reviewed and stable  Last Vitals:  Vitals:   11/12/16 1026 11/12/16 1408  BP: (!) 137/91   Pulse: 76   Resp: 20   Temp: 36.8 C 36.7 C    Last Pain:  Vitals:   11/12/16 1408  TempSrc:   PainSc: Asleep      Patients Stated Pain Goal: 5 (11/65/79 0383)  Complications: No apparent anesthesia complications

## 2016-11-12 NOTE — Anesthesia Preprocedure Evaluation (Addendum)
Anesthesia Evaluation  Patient identified by MRN, date of birth, ID band Patient awake    Reviewed: Allergy & Precautions, NPO status , Patient's Chart, lab work & pertinent test results  History of Anesthesia Complications Negative for: history of anesthetic complications  Airway Mallampati: II  TM Distance: >3 FB Neck ROM: Full    Dental  (+) Dental Advisory Given   Pulmonary Recent URI , Residual Cough,    breath sounds clear to auscultation       Cardiovascular hypertension,  Rhythm:Regular Rate:Normal  '15 ECHO: EF 55-60%, valves OK   Neuro/Psych negative neurological ROS     GI/Hepatic Neg liver ROS, GERD  Controlled,  Endo/Other  Morbid obesity  Renal/GU Renal InsufficiencyRenal disease (K+ 3.9, no dialysis yet)     Musculoskeletal  (+) Arthritis ,   Abdominal (+) + obese,   Peds  Hematology  (+) Blood dyscrasia (Hb 10.5), ,   Anesthesia Other Findings   Reproductive/Obstetrics                            Anesthesia Physical Anesthesia Plan  ASA: III  Anesthesia Plan: General   Post-op Pain Management:    Induction: Intravenous  Airway Management Planned: LMA  Additional Equipment:   Intra-op Plan:   Post-operative Plan:   Informed Consent: I have reviewed the patients History and Physical, chart, labs and discussed the procedure including the risks, benefits and alternatives for the proposed anesthesia with the patient or authorized representative who has indicated his/her understanding and acceptance.   Dental advisory given  Plan Discussed with: CRNA and Surgeon  Anesthesia Plan Comments: (Plan routine monitors, GA- LMA OK)        Anesthesia Quick Evaluation

## 2016-11-12 NOTE — Anesthesia Procedure Notes (Signed)
Procedure Name: LMA Insertion Date/Time: 11/12/2016 12:26 PM Performed by: Eligha Bridegroom Pre-anesthesia Checklist: Patient identified Patient Re-evaluated:Patient Re-evaluated prior to inductionOxygen Delivery Method: Circle system utilized Preoxygenation: Pre-oxygenation with 100% oxygen Intubation Type: IV induction LMA: LMA inserted LMA Size: 4.0 Placement Confirmation: positive ETCO2 and breath sounds checked- equal and bilateral Tube secured with: Tape Dental Injury: Teeth and Oropharynx as per pre-operative assessment

## 2016-11-12 NOTE — Anesthesia Postprocedure Evaluation (Signed)
Anesthesia Post Note  Patient: Concepcion Kirkpatrick  Procedure(s) Performed: Procedure(s) (LRB): FISTULA SUPERFICIALIZATION RIGHT FOREEARM (Right)  Patient location during evaluation: PACU Anesthesia Type: General Level of consciousness: awake and alert, patient cooperative and oriented Pain management: pain level controlled Vital Signs Assessment: post-procedure vital signs reviewed and stable Respiratory status: spontaneous breathing, nonlabored ventilation and respiratory function stable Cardiovascular status: blood pressure returned to baseline and stable Postop Assessment: no signs of nausea or vomiting Anesthetic complications: no       Last Vitals:  Vitals:   11/12/16 1445 11/12/16 1451  BP:  (!) 133/94  Pulse: 79 78  Resp: (!) 23 (!) 22  Temp:      Last Pain:  Vitals:   11/12/16 1430  TempSrc:   PainSc: 0-No pain                 Cartel Mauss,E. Shaina Gullatt

## 2016-11-12 NOTE — Interval H&P Note (Signed)
History and Physical Interval Note:  11/12/2016 11:37 AM  Colin [name]  has presented today for surgery, with the diagnosis of Stage IV Chronic Kidney Disease N18.4  The various methods of treatment have been discussed with the patient and family. After consideration of risks, benefits and other options for treatment, the patient has consented to  Procedure(s): FISTULA SUPERFICIALIZATION (Right) as a surgical intervention .  The patient's history has been reviewed, patient examined, no change in status, stable for surgery.  I have reviewed the patient's chart and labs.  Questions were answered to the patient's satisfaction.     Curt Jews

## 2016-11-12 NOTE — H&P (View-Only) (Signed)
y                                    Patient name: Colin [name] MRN: [id number] DOB: 1966/02/13 Sex: male  REASON FOR VISIT: Follow-up right radiocephalic fistula  HPI: Colin [name] is a 51 y.o. male here today for follow-up.  Had right radiocephalic fistula creation on 08/27/2016.  He is stage IV kidney disease.  Not yet on hemodialysis.  He had no difficulty associated with the procedure  Current Outpatient Prescriptions  Medication Sig Dispense Refill  . allopurinol (ZYLOPRIM) 300 MG tablet Take 600 mg by mouth at bedtime.     Marland Kitchen atenolol (TENORMIN) 100 MG tablet Take 100 mg by mouth at bedtime.     . furosemide (LASIX) 80 MG tablet Take 160 mg by mouth every evening.    . hydrALAZINE (APRESOLINE) 50 MG tablet Take 1 tablet (50 mg total) by mouth at bedtime.    . hydrALAZINE (APRESOLINE) 50 MG tablet TAKE 1 TABLET BY MOUTH 3 TIMES A DAY AS NEEDED 30 tablet 0  . isosorbide mononitrate (IMDUR) 60 MG 24 hr tablet Take 1 tablet (60 mg total) by mouth at bedtime.    . pravastatin (PRAVACHOL) 40 MG tablet Take 40 mg by mouth every evening.     Marland Kitchen oxyCODONE-acetaminophen (PERCOCET/ROXICET) 5-325 MG tablet Take 1 tablet by mouth every 6 (six) hours as needed. (Patient not taking: Reported on 10/26/2016) 6 tablet 0   No current facility-administered medications for this visit.      PHYSICAL EXAM: Vitals:   10/26/16 0821 10/26/16 0822  BP: (!) 144/91 (!) 146/93  Pulse: 71   Resp: 20   Temp: 98.7 F (37.1 C)   TempSrc: Oral   SpO2: 97%   Weight: 250 lb 4.8 oz (113.5 kg)   Height: 5' 4.5" (1.638 m)     GENERAL: The patient is a well-nourished male, in no acute distress. The vital signs are documented above. He does have moderate keloid formation at the incision between the cephalic vein and radial artery just above his wrist.  Does have an excellent thrill.  His vein does run rather deep under the surface  I reviewed his duplex from 09/29/2016.  This shows a very nice Colin [name] size maturation  with his fistula being approximate 6 mm throughout its course.   I imaged his vein with the SonoSite ultrasound myself.  Does have a nice caliber throughout.  He does run rather deep under the subcutaneous fat and fascia.  MEDICAL ISSUES: Nice size maturation 2 months out from her AV fistula creation.  I do feel that his vein runs too deep for access.  Explained that he will require superficial mobilization of the cephalic vein to be able to be used for access.  With his moderate keloid formation I explained that in all likelihood he will have this happen at his incisions throughout his forearm.  Explained he would have several incisions to mobilize the cephalic vein to directly under the surface.  Understands and wished to proceed as an outpatient at his earliest [place]. [name], MD [facility] Tel [phone] Pager [phone]

## 2016-11-13 ENCOUNTER — Encounter (HOSPITAL_COMMUNITY): Payer: Self-pay | Admitting: Vascular Surgery

## 2016-11-16 ENCOUNTER — Other Ambulatory Visit: Payer: Self-pay | Admitting: Cardiovascular Disease

## 2016-11-24 ENCOUNTER — Other Ambulatory Visit (HOSPITAL_COMMUNITY): Payer: Commercial Managed Care - PPO

## 2016-12-01 DIAGNOSIS — N189 Chronic kidney disease, unspecified: Secondary | ICD-10-CM | POA: Diagnosis not present

## 2016-12-01 DIAGNOSIS — M1A00X0 Idiopathic chronic gout, unspecified site, without tophus (tophi): Secondary | ICD-10-CM | POA: Diagnosis not present

## 2016-12-01 DIAGNOSIS — M25562 Pain in left knee: Secondary | ICD-10-CM | POA: Diagnosis not present

## 2016-12-06 DIAGNOSIS — M2550 Pain in unspecified joint: Secondary | ICD-10-CM | POA: Diagnosis not present

## 2016-12-06 DIAGNOSIS — M1A00X0 Idiopathic chronic gout, unspecified site, without tophus (tophi): Secondary | ICD-10-CM | POA: Diagnosis not present

## 2016-12-07 ENCOUNTER — Ambulatory Visit (HOSPITAL_COMMUNITY): Payer: Commercial Managed Care - PPO | Attending: Cardiovascular Disease

## 2016-12-08 ENCOUNTER — Encounter: Payer: Self-pay | Admitting: Vascular Surgery

## 2016-12-14 ENCOUNTER — Ambulatory Visit (INDEPENDENT_AMBULATORY_CARE_PROVIDER_SITE_OTHER): Payer: Self-pay | Admitting: Physician Assistant

## 2016-12-14 VITALS — BP 126/81 | HR 71 | Temp 98.1°F | Resp 20 | Ht 64.5 in | Wt 237.0 lb

## 2016-12-14 DIAGNOSIS — N184 Chronic kidney disease, stage 4 (severe): Secondary | ICD-10-CM

## 2016-12-14 NOTE — Progress Notes (Signed)
  POST OPERATIVE OFFICE NOTE    CC:  F/u for surgery  HPI:  This is a 51 y.o. male who is s/p Translocation of cephalic vein radiocephalic fistula.  He reports no fever or chills.  He has no numbness or weakness in the right UE.  He is not yet on HD.    Allergies  Allergen Reactions  . No Known Allergies     Current Outpatient Prescriptions  Medication Sig Dispense Refill  . allopurinol (ZYLOPRIM) 300 MG tablet Take 600 mg by mouth at bedtime.     Marland Kitchen atenolol (TENORMIN) 100 MG tablet Take 100 mg by mouth at bedtime.     . furosemide (LASIX) 80 MG tablet Take 160 mg by mouth every evening.    . hydrALAZINE (APRESOLINE) 50 MG tablet Take 1 tablet (50 mg total) by mouth at bedtime. (Patient taking differently: Take 50 mg by mouth 2 (two) times daily. )    . isosorbide mononitrate (IMDUR) 60 MG 24 hr tablet Take 1 tablet (60 mg total) by mouth at bedtime.    Marland Kitchen oxyCODONE-acetaminophen (PERCOCET/ROXICET) 5-325 MG tablet Take 1 tablet by mouth every 6 (six) hours as needed. 12 tablet 0  . pravastatin (PRAVACHOL) 40 MG tablet Take 40 mg by mouth every evening.     . promethazine (PHENERGAN) 25 MG suppository Place 1 suppository (25 mg total) rectally every 6 (six) hours as needed for nausea or vomiting. 8 each 0   No current facility-administered medications for this visit.      ROS:  See HPI  Physical Exam:  Vitals:   12/14/16 1459  BP: 126/81  Pulse: 71  Resp: 20  Temp: 98.1 F (36.7 C)    Incision:  Well healed Extremities:  Grip 5/5, palpable thrill throughout the RC fistula, small keloid formation at incisions.   Neuro: sensation intact and equal bilateral UE   Assessment/Plan:  This is a 51 y.o. male who is s/p:  Translocation of cephalic vein radiocephalic fistula The fistula is ready for immediate use when the time comes for HD.  I advised his to check a palpable thrill in the fistula daily.  If he has problems or concerns he will call.  Otherwise f/u PRN.   Laurence Slate [facility] PA-C [facility] [phone]  Clinic MD:  Early

## 2016-12-17 ENCOUNTER — Telehealth: Payer: Self-pay | Admitting: *Deleted

## 2016-12-17 ENCOUNTER — Encounter: Payer: Self-pay | Admitting: *Deleted

## 2016-12-17 NOTE — Telephone Encounter (Signed)
This patient has canceled his Echocardiogram twice 2/7 and 12/07/16,a letter was mailed today.

## 2017-01-05 DIAGNOSIS — N184 Chronic kidney disease, stage 4 (severe): Secondary | ICD-10-CM | POA: Diagnosis not present

## 2017-01-05 DIAGNOSIS — N2581 Secondary hyperparathyroidism of renal origin: Secondary | ICD-10-CM | POA: Diagnosis not present

## 2017-01-05 DIAGNOSIS — N189 Chronic kidney disease, unspecified: Secondary | ICD-10-CM | POA: Diagnosis not present

## 2017-01-05 DIAGNOSIS — D631 Anemia in chronic kidney disease: Secondary | ICD-10-CM | POA: Diagnosis not present

## 2017-01-05 DIAGNOSIS — I129 Hypertensive chronic kidney disease with stage 1 through stage 4 chronic kidney disease, or unspecified chronic kidney disease: Secondary | ICD-10-CM | POA: Diagnosis not present

## 2017-02-06 NOTE — Progress Notes (Signed)
Cardiology Office Note  Date:  02/07/2017   ID:  Colin [name], DOB 04-Sep-1966, MRN [id number]  PCP:  Simona Huh, MD   Chief Complaint  Patient presents with  . other    1 yr f/u c/o flunctuating BP. Meds reviewed verbally with pt.    HPI:  Colin [name] is a very pleasant 51 year old gentleman,  with a history of  hypertension,  stage IV kidney disease,AV fistula, for HD ulcerative colitis dating back to 2000.  Chronic leg edema Echo normal in 01/2014 He presents today for follow-up of his hypertension and leg swelling  Last seen 09/2015 s/p Translocation of cephalic vein radiocephalic fistula.  In follow-up he reports that he feels well No chest pain or shortness of breath on exertion Taking hydralazine 50 mg in the evening, atenolol 100 mg in the evening Lasix 160 mg 1 daily He previously held his isosorbide Makes urine, not on dialysis Edema is stable, trace. No significant change  EKG personally reviewed by myself  Lab work reviewed Total cholesterol well controlled, total cholesterol 153, LDL 80 Hemoglobin A1c 5.1  EKG on today's visit shows normal sinus rhythm with rate 63 bpm, no significant ST or T-wave changes  Other past medical history He is a nonsmoker, nondiabetic. Father died early from kidney related complications and hemodialysis.   PMH:   has a past medical history of Anemia; Arthritis; CKD (chronic kidney disease) stage 3, GFR 30-59 ml/min; Gout; History of chicken pox; Hypertension; Melanosis coli; Mumps; Pneumonia; and Ulcerative colitis (2000).  PSH:    Past Surgical History:  Procedure Laterality Date  . AV FISTULA PLACEMENT Right 08/27/2016   Procedure: RIGHT RADIOCEPHALIC ARTERIOVENOUS FISTULA CREATION;  Surgeon: [name], MD;  Location: [place];  Service: Vascular;  Laterality: Right;  . COLONOSCOPY    . FISTULA SUPERFICIALIZATION Right 11/12/2016   Procedure: FISTULA SUPERFICIALIZATION RIGHT FOREEARM;  Surgeon: [name], MD;   Location: [facility] OR;  Service: Vascular;  Laterality: Right;  . TENDON REPAIR  1987   Left index finger    Current Outpatient Prescriptions  Medication Sig Dispense Refill  . allopurinol (ZYLOPRIM) 300 MG tablet Take 600 mg by mouth at bedtime.     Marland Kitchen atenolol (TENORMIN) 100 MG tablet Take 100 mg by mouth at bedtime.     . furosemide (LASIX) 80 MG tablet Take 160 mg by mouth every evening.    . hydrALAZINE (APRESOLINE) 50 MG tablet Take 1 tablet (50 mg total) by mouth at bedtime. (Patient taking differently: Take 50 mg by mouth 2 (two) times daily. )    . oxyCODONE-acetaminophen (PERCOCET/ROXICET) 5-325 MG tablet Take 1 tablet by mouth every 6 (six) hours as needed. 12 tablet 0  . pravastatin (PRAVACHOL) 40 MG tablet Take 40 mg by mouth every evening.     . promethazine (PHENERGAN) 25 MG suppository Place 1 suppository (25 mg total) rectally every 6 (six) hours as needed for nausea or vomiting. 8 each 0   No current facility-administered medications for this visit.      Allergies:   No known allergies   Social History:  The patient  reports that he has never smoked. He has quit using smokeless tobacco. His smokeless tobacco use included Chew. He reports that he does not drink alcohol or use drugs.   Family History:   family history includes Heart disease in his father; Hypertension in his father and mother; Renal Disease in his father.    Review of Systems: Review of Systems  Constitutional: Negative.   Respiratory: Negative.   Cardiovascular: Positive for leg swelling.  Gastrointestinal: Negative.   Musculoskeletal: Negative.   Neurological: Negative.   Psychiatric/Behavioral: Negative.   All other systems reviewed and are negative.    PHYSICAL EXAM: VS:  BP 136/90 (BP Location: Left Arm, Patient Position: Sitting, Cuff Size: Normal)   Pulse 63   Ht 5' 5"  (1.651 m)   Wt 240 lb 12 oz (109.2 kg)   BMI 40.06 kg/m  , BMI Body mass index is 40.06 kg/m. GEN: Well nourished, well  developed, in no acute distress  HEENT: normal  Neck: no JVD, carotid bruits, or masses Cardiac: RRR; no murmurs, rubs, or gallops,no edema  Respiratory:  clear to auscultation bilaterally, normal work of breathing GI: soft, nontender, nondistended, + BS MS: no deformity or atrophy  Skin: warm and dry, no rash Neuro:  Strength and sensation are intact Psych: euthymic mood, full affect    Recent Labs: 11/12/2016: Hemoglobin 10.5; Potassium 3.9; Sodium 140    Lipid Panel No results found for: CHOL, HDL, LDLCALC, TRIG    Wt Readings from Last 3 Encounters:  02/07/17 240 lb 12 oz (109.2 kg)  12/14/16 237 lb (107.5 kg)  11/12/16 250 lb (113.4 kg)        ASSESSMENT AND PLAN:  Essential hypertension - Plan: EKG 12-Lead Blood pressure is well controlled on today's visit. No changes made to the medications. Recommended he monitor blood pressure at home he could take extra hydralazine if needed.   Mixed hyperlipidemia At goal, will stay on current meds  Localized edema Trace leg edema, stable  ESRD (end stage renal disease) ([place]) AV graft in place  Morbid obesity ([place]) We have encouraged continued exercise, careful diet management in an effort to lose weight.   Total encounter time more than 25 minutes  Greater than 50% was spent in counseling and coordination of care with the patient  Disposition:   F/U  12 months   Orders Placed This Encounter  Procedures  . EKG 12-Lead     Signed, Esmond Plants, M.D., Ph.D. 02/07/2017  [place], [place]

## 2017-02-07 ENCOUNTER — Encounter: Payer: Self-pay | Admitting: Cardiovascular Disease

## 2017-02-07 ENCOUNTER — Ambulatory Visit (INDEPENDENT_AMBULATORY_CARE_PROVIDER_SITE_OTHER): Payer: Commercial Managed Care - PPO | Admitting: Cardiovascular Disease

## 2017-02-07 VITALS — BP 136/90 | HR 63 | Ht 65.0 in | Wt 240.8 lb

## 2017-02-07 DIAGNOSIS — R600 Localized edema: Secondary | ICD-10-CM | POA: Diagnosis not present

## 2017-02-07 DIAGNOSIS — N186 End stage renal disease: Secondary | ICD-10-CM

## 2017-02-07 DIAGNOSIS — I10 Essential (primary) hypertension: Secondary | ICD-10-CM

## 2017-02-07 DIAGNOSIS — E782 Mixed hyperlipidemia: Secondary | ICD-10-CM

## 2017-02-07 NOTE — Patient Instructions (Signed)

## 2017-04-04 DIAGNOSIS — I129 Hypertensive chronic kidney disease with stage 1 through stage 4 chronic kidney disease, or unspecified chronic kidney disease: Secondary | ICD-10-CM | POA: Diagnosis not present

## 2017-04-04 DIAGNOSIS — N643 Galactorrhea not associated with childbirth: Secondary | ICD-10-CM | POA: Diagnosis not present

## 2017-04-04 DIAGNOSIS — N644 Mastodynia: Secondary | ICD-10-CM | POA: Diagnosis not present

## 2017-04-04 DIAGNOSIS — N184 Chronic kidney disease, stage 4 (severe): Secondary | ICD-10-CM | POA: Diagnosis not present

## 2017-04-04 DIAGNOSIS — E7800 Pure hypercholesterolemia, unspecified: Secondary | ICD-10-CM | POA: Diagnosis not present

## 2017-04-04 DIAGNOSIS — M109 Gout, unspecified: Secondary | ICD-10-CM | POA: Diagnosis not present

## 2017-04-04 DIAGNOSIS — R7309 Other abnormal glucose: Secondary | ICD-10-CM | POA: Diagnosis not present

## 2017-04-07 ENCOUNTER — Other Ambulatory Visit: Payer: Self-pay | Admitting: Family Medicine

## 2017-04-07 DIAGNOSIS — E229 Hyperfunction of pituitary gland, unspecified: Secondary | ICD-10-CM

## 2017-04-07 DIAGNOSIS — N643 Galactorrhea not associated with childbirth: Secondary | ICD-10-CM

## 2017-04-07 DIAGNOSIS — R7989 Other specified abnormal findings of blood chemistry: Secondary | ICD-10-CM

## 2017-04-22 ENCOUNTER — Other Ambulatory Visit: Payer: Commercial Managed Care - PPO

## 2017-04-26 DIAGNOSIS — N2581 Secondary hyperparathyroidism of renal origin: Secondary | ICD-10-CM | POA: Diagnosis not present

## 2017-04-26 DIAGNOSIS — D631 Anemia in chronic kidney disease: Secondary | ICD-10-CM | POA: Diagnosis not present

## 2017-04-26 DIAGNOSIS — N184 Chronic kidney disease, stage 4 (severe): Secondary | ICD-10-CM | POA: Diagnosis not present

## 2017-04-26 DIAGNOSIS — I129 Hypertensive chronic kidney disease with stage 1 through stage 4 chronic kidney disease, or unspecified chronic kidney disease: Secondary | ICD-10-CM | POA: Diagnosis not present

## 2017-05-01 ENCOUNTER — Ambulatory Visit
Admission: RE | Admit: 2017-05-01 | Discharge: 2017-05-01 | Disposition: A | Discharge status: Home / Self Care | Payer: Commercial Managed Care - PPO | Source: Ambulatory Visit | Attending: Family Medicine | Admitting: Family Medicine

## 2017-05-01 DIAGNOSIS — E229 Hyperfunction of pituitary gland, unspecified: Secondary | ICD-10-CM

## 2017-05-01 DIAGNOSIS — R7989 Other specified abnormal findings of blood chemistry: Secondary | ICD-10-CM

## 2017-05-01 DIAGNOSIS — N643 Galactorrhea not associated with childbirth: Secondary | ICD-10-CM

## 2017-05-01 DIAGNOSIS — E221 Hyperprolactinemia: Secondary | ICD-10-CM | POA: Diagnosis not present

## 2017-05-05 ENCOUNTER — Ambulatory Visit: Payer: Commercial Managed Care - PPO | Admitting: Endocrinology

## 2017-05-06 ENCOUNTER — Ambulatory Visit (HOSPITAL_COMMUNITY)
Admission: EM | Admit: 2017-05-06 | Discharge: 2017-05-06 | Disposition: A | Discharge status: Home / Self Care | Payer: Commercial Managed Care - PPO | Attending: Family | Admitting: Family

## 2017-05-06 ENCOUNTER — Encounter (HOSPITAL_COMMUNITY): Payer: Self-pay | Admitting: Emergency Medicine

## 2017-05-06 DIAGNOSIS — M62830 Muscle spasm of back: Secondary | ICD-10-CM

## 2017-05-06 MED ORDER — CYCLOBENZAPRINE HCL 10 MG PO TABS: 10.0000 mg | ORAL_TABLET | Freq: Every evening | ORAL | 0 refills | Status: DC | PRN

## 2017-05-06 MED ORDER — LIDOCAINE 5 % EX PTCH: 1.0000 | MEDICATED_PATCH | CUTANEOUS | 0 refills | Status: DC

## 2017-05-06 NOTE — ED Provider Notes (Signed)
CSN: [id number]     Arrival date & time 05/06/17  1748 History   First MD Initiated Contact with Patient 05/06/17 1804     Chief Complaint  Patient presents with  . Back Pain   (Consider location/radiation/quality/duration/timing/severity/associated sxs/prior Treatment) Chief complaint of muscle spasm mid back , 2 days,  improved yesterday. Noted pain started near shoulders.  No injury. Notes walking dog couple of days ago.  NO HA, vision changes. Pain is not related to eating.  Tried tyleonol with no relief.   History of CKD- no HD, hypertension  Denies exertional chest pain or pressure, numbness or tingling radiating to left arm or jaw, palpitations, dizziness, frequent headaches, changes in vision, or shortness of breath.        Past Medical History:  Diagnosis Date  . Anemia    iron deficiency  . Arthritis    Gout  . CKD (chronic kidney disease) stage 3, GFR 30-59 ml/min   . Gout   . History of chicken pox   . Hypertension   . Melanosis coli   . Mumps   . Pneumonia   . Ulcerative colitis [place], [place]   Past Surgical History:  Procedure Laterality Date  . AV FISTULA PLACEMENT Right 08/27/2016   Procedure: RIGHT RADIOCEPHALIC ARTERIOVENOUS FISTULA CREATION;  Surgeon: [name], MD;  Location: [facility];  Service: Vascular;  Laterality: Right;  . COLONOSCOPY    . FISTULA SUPERFICIALIZATION Right 11/12/2016   Procedure: FISTULA SUPERFICIALIZATION RIGHT FOREEARM;  Surgeon: [name], MD;  Location: [facility] OR;  Service: Vascular;  Laterality: Right;  . TENDON REPAIR  1987   Left index finger   Family History  Problem Relation Age of Onset  . Hypertension Father   . Heart disease Father   . Renal Disease Father   . Hypertension Mother    Social History  Substance Use Topics  . Smoking status: Never Smoker  . Smokeless tobacco: Former Systems developer    Types: Chew  . Alcohol use No    Review of Systems  Constitutional: Negative for chills and fever.  Eyes:  Negative for visual disturbance.  Respiratory: Negative for cough.   Cardiovascular: Negative for chest pain and palpitations.  Gastrointestinal: Negative for nausea and vomiting.  Musculoskeletal: Positive for back pain. Negative for neck pain and neck stiffness.  Skin: Negative for rash.  Neurological: Negative for numbness and headaches.    Allergies  No known allergies  Home Medications   Prior to Admission medications   Medication Sig Start Date End Date Taking? Authorizing Provider  allopurinol (ZYLOPRIM) 300 MG tablet Take 600 mg by mouth at bedtime.     [provider]  atenolol (TENORMIN) 100 MG tablet Take 100 mg by mouth at bedtime.     [provider]  cyclobenzaprine (FLEXERIL) 10 MG tablet Take 1 tablet (10 mg total) by mouth at bedtime as needed for muscle spasms. 05/06/17   Burnard Hawthorne, FNP  furosemide (LASIX) 80 MG tablet Take 160 mg by mouth every evening.    [provider]  hydrALAZINE (APRESOLINE) 50 MG tablet Take 1 tablet (50 mg total) by mouth at bedtime. Patient taking differently: Take 50 mg by mouth 2 (two) times daily.  08/27/16   Virgina Jock A, PA-C  lidocaine (LIDODERM) 5 % Place 1 patch onto the skin daily. Remove & Discard patch within 12 hours. 05/06/17   Burnard Hawthorne, FNP  oxyCODONE-acetaminophen (PERCOCET/ROXICET) 5-325 MG tablet Take 1  tablet by mouth every 6 (six) hours as needed. 11/12/16   Ulyses Amor, PA-C  pravastatin (PRAVACHOL) 40 MG tablet Take 40 mg by mouth every evening.     [provider]  promethazine (PHENERGAN) 25 MG suppository Place 1 suppository (25 mg total) rectally every 6 (six) hours as needed for nausea or vomiting. 11/12/16   Rhyne, Hulen Shouts, PA-C   Meds Ordered and Administered this Visit  Medications - No data to display  BP 136/87   Pulse 69   Temp 98.4 F (36.9 C) (Oral)   Resp 16   Ht 5' 5"  (1.651 m)   Wt 220 lb (99.8 kg)   SpO2 97%   BMI 36.61 kg/m  No data  found.   Physical Exam  Constitutional: He appears well-developed and well-nourished.  Neck: Normal range of motion and full passive range of motion without pain. Neck supple. Spinous process tenderness and muscular tenderness present. No neck rigidity. No erythema present.    Tenderness of trapezius as noted on diagram   Cardiovascular: Regular rhythm and normal heart sounds.   Pulmonary/Chest: Effort normal and breath sounds normal. No respiratory distress. He has no wheezes. He has no rhonchi. He has no rales.  Musculoskeletal:       Right shoulder: He exhibits normal range of motion, no tenderness, no bony tenderness, no swelling, no effusion, no pain and no spasm.       Left shoulder: He exhibits normal range of motion, no tenderness, no bony tenderness, no swelling, no pain and no spasm.  Mild discomfort right upper back with internal rotation.   Lymphadenopathy:       Head (left side): No submandibular and no preauricular adenopathy present.  Neurological: He is alert.  Skin: Skin is warm and dry.  Psychiatric: He has a normal mood and affect. His speech is normal and behavior is normal.  Vitals reviewed.   Urgent Care Course     Procedures (including critical care time)  Labs Review Labs Reviewed - No data to display  Imaging Review No results found.     MDM   1. Muscle spasm of back    Patient is well-appearing .Symptoms most consistent with muscle spasm. Tenderness with palpation. Pain not associated with food. No rash seen. We discussed and agreed on conservative management including heat, when necessary muscle relaxant, lidocaine patches. Return precautions given.    Burnard Hawthorne, [place] 05/06/17 [phone]

## 2017-05-06 NOTE — Discharge Instructions (Signed)
As discussed, your symptoms are consistent with muscle spasm.  Heat, massage, gentle stretches, lidocaine patches as needed. Flexeril at bedtime Do not drive or operate heavy machinery while on muscle relaxant. Please do not drink alcohol. Only take this medication as needed for acute muscle spasm at bedtime. This medication make you feel drowsy so be very careful.  Stop taking if become too drowsy or somnolent as this puts you at risk for falls. Please contact our office with any questions.   As also discussed, please stay vigilant for any rash as this could be early onset shingles  If there is no improvement in your symptoms, or if there is any worsening of symptoms, or if you have any additional concerns, please return for re-evaluation; or, if we are closed, consider going to the Emergency Room for evaluation if symptoms urgent.

## 2017-05-06 NOTE — ED Triage Notes (Signed)
PT reports back spasms since Wednesday. No injury

## 2017-05-07 ENCOUNTER — Encounter (HOSPITAL_COMMUNITY): Payer: Self-pay | Admitting: Emergency Medicine

## 2017-05-07 ENCOUNTER — Emergency Department (HOSPITAL_COMMUNITY): Payer: Commercial Managed Care - PPO

## 2017-05-07 ENCOUNTER — Emergency Department (HOSPITAL_COMMUNITY)
Admission: EM | Admit: 2017-05-07 | Discharge: 2017-05-07 | Disposition: A | Discharge status: Home / Self Care | Payer: Commercial Managed Care - PPO | Attending: Emergency Medicine | Admitting: Emergency Medicine

## 2017-05-07 DIAGNOSIS — N189 Chronic kidney disease, unspecified: Secondary | ICD-10-CM

## 2017-05-07 DIAGNOSIS — R748 Abnormal levels of other serum enzymes: Secondary | ICD-10-CM | POA: Diagnosis not present

## 2017-05-07 DIAGNOSIS — I120 Hypertensive chronic kidney disease with stage 5 chronic kidney disease or end stage renal disease: Secondary | ICD-10-CM | POA: Insufficient documentation

## 2017-05-07 DIAGNOSIS — B029 Zoster without complications: Secondary | ICD-10-CM | POA: Insufficient documentation

## 2017-05-07 DIAGNOSIS — M546 Pain in thoracic spine: Secondary | ICD-10-CM | POA: Diagnosis not present

## 2017-05-07 DIAGNOSIS — N186 End stage renal disease: Secondary | ICD-10-CM | POA: Insufficient documentation

## 2017-05-07 DIAGNOSIS — Z87891 Personal history of nicotine dependence: Secondary | ICD-10-CM | POA: Insufficient documentation

## 2017-05-07 DIAGNOSIS — Z79899 Other long term (current) drug therapy: Secondary | ICD-10-CM | POA: Diagnosis not present

## 2017-05-07 DIAGNOSIS — J9811 Atelectasis: Secondary | ICD-10-CM | POA: Diagnosis not present

## 2017-05-07 LAB — COMPREHENSIVE METABOLIC PANEL
ALBUMIN: 3.3 g/dL — AB (ref 3.5–5.0)
ALT: 23 U/L (ref 17–63)
ANION GAP: 10 (ref 5–15)
AST: 26 U/L (ref 15–41)
Alkaline Phosphatase: 38 U/L (ref 38–126)
BUN: 61 mg/dL — ABNORMAL HIGH (ref 6–20)
CHLORIDE: 103 mmol/L (ref 101–111)
CO2: 22 mmol/L (ref 22–32)
Calcium: 8.9 mg/dL (ref 8.9–10.3)
Creatinine, Ser: 5.14 mg/dL — ABNORMAL HIGH (ref 0.61–1.24)
GFR calc non Af Amer: 12 mL/min — ABNORMAL LOW (ref >60)
GFR, EST AFRICAN AMERICAN: 14 mL/min — AB (ref >60)
GLUCOSE: 102 mg/dL — AB (ref 65–99)
Potassium: 3.2 mmol/L — ABNORMAL LOW (ref 3.5–5.1)
SODIUM: 135 mmol/L (ref 135–145)
Total Bilirubin: 0.7 mg/dL (ref 0.3–1.2)
Total Protein: 6.8 g/dL (ref 6.5–8.1)

## 2017-05-07 LAB — URINALYSIS, ROUTINE W REFLEX MICROSCOPIC
Bilirubin Urine: NEGATIVE
GLUCOSE, UA: NEGATIVE mg/dL
KETONES UR: NEGATIVE mg/dL
LEUKOCYTES UA: NEGATIVE
Nitrite: NEGATIVE
SQUAMOUS EPITHELIAL / LPF: NONE SEEN
Specific Gravity, Urine: 1.011 (ref 1.005–1.030)
pH: 5 (ref 5.0–8.0)

## 2017-05-07 LAB — CBC WITH DIFFERENTIAL/PLATELET
Basophils Absolute: 0 10*3/uL (ref 0.0–0.1)
Basophils Relative: 0 %
Eosinophils Absolute: 0.3 10*3/uL (ref 0.0–0.7)
Eosinophils Relative: 4 %
HEMATOCRIT: 30.2 % — AB (ref 39.0–52.0)
Hemoglobin: 10 g/dL — ABNORMAL LOW (ref 13.0–17.0)
LYMPHS ABS: 1.3 10*3/uL (ref 0.7–4.0)
LYMPHS PCT: 19 %
MCH: 33.6 pg (ref 26.0–34.0)
MCHC: 33.1 g/dL (ref 30.0–36.0)
MCV: 101.3 fL — AB (ref 78.0–100.0)
MONOS PCT: 19 %
Monocytes Absolute: 1.4 10*3/uL — ABNORMAL HIGH (ref 0.1–1.0)
NEUTROS ABS: 4.1 10*3/uL (ref 1.7–7.7)
NEUTROS PCT: 58 %
Platelets: 318 10*3/uL (ref 150–400)
RBC: 2.98 MIL/uL — ABNORMAL LOW (ref 4.22–5.81)
RDW: 13.7 % (ref 11.5–15.5)
WBC: 7.1 10*3/uL (ref 4.0–10.5)

## 2017-05-07 LAB — SEDIMENTATION RATE: Sed Rate: 78 mm/hr — ABNORMAL HIGH (ref 0–16)

## 2017-05-07 LAB — CK: CK TOTAL: 628 U/L — AB (ref 49–397)

## 2017-05-07 MED ORDER — PREDNISONE 10 MG (21) PO TBPK: ORAL_TABLET | Freq: Every day | ORAL | 0 refills | Status: DC

## 2017-05-07 MED ORDER — MORPHINE SULFATE (PF) 4 MG/ML IV SOLN
4.0000 mg | Freq: Once | INTRAVENOUS | Status: AC
  Administered 2017-05-07: 4 mg via INTRAVENOUS
  Filled 2017-05-07: qty 1

## 2017-05-07 MED ORDER — SODIUM CHLORIDE 0.9 % IV BOLUS (SEPSIS): 1000.0000 mL | Freq: Once | INTRAVENOUS | Status: DC

## 2017-05-07 MED ORDER — HYDROMORPHONE HCL 1 MG/ML IJ SOLN
1.0000 mg | Freq: Once | INTRAMUSCULAR | Status: AC
  Administered 2017-05-07: 1 mg via INTRAVENOUS
  Filled 2017-05-07: qty 1

## 2017-05-07 MED ORDER — KETOROLAC TROMETHAMINE 30 MG/ML IJ SOLN: 30.0000 mg | Freq: Once | INTRAMUSCULAR | Status: DC

## 2017-05-07 MED ORDER — VALACYCLOVIR HCL 1 G PO TABS: 1000.0000 mg | ORAL_TABLET | Freq: Three times a day (TID) | ORAL | 0 refills | Status: DC

## 2017-05-07 MED ORDER — OXYCODONE-ACETAMINOPHEN 5-325 MG PO TABS: 1.0000 | ORAL_TABLET | ORAL | 0 refills | Status: DC | PRN

## 2017-05-07 MED ORDER — DIAZEPAM 5 MG PO TABS
5.0000 mg | ORAL_TABLET | Freq: Once | ORAL | Status: AC
  Administered 2017-05-07: 5 mg via ORAL
  Filled 2017-05-07: qty 1

## 2017-05-07 MED ORDER — SODIUM CHLORIDE 0.9 % IV BOLUS (SEPSIS)
1000.0000 mL | Freq: Once | INTRAVENOUS | Status: AC
  Administered 2017-05-07: 1000 mL via INTRAVENOUS

## 2017-05-07 NOTE — ED Notes (Signed)
RN attmpted IV Messlia S. RN attempted IV.

## 2017-05-07 NOTE — ED Triage Notes (Signed)
Patient presents today from home with complaints of back pain since [place]. Patient reports he was seen at Urgent care yesterday given lido patch and muscle relaxer and reports the pain is not improving. Patient reports pain started in shoulders and has move to entire back. Patient denies any trauma.Patient alert and oriented x4 and ambulatory

## 2017-05-07 NOTE — ED Notes (Signed)
Patient has urinal for urine.

## 2017-05-07 NOTE — Discharge Instructions (Signed)
Read the information below.  Use the prescribed medication as directed.  Please discuss all new medications with your pharmacist.  Do not take additional tylenol while taking the prescribed pain medication to avoid overdose.  You may return to the Emergency Department at any time for worsening condition or any new symptoms that concern you.     Please stay out of the heat and sun and drink plenty of fluids over the next few days.  Follow up with both Dr Justin Mend and Dr Marisue Humble as soon as possible for a recheck.    Be aware that you have an active shingles/chicken pox virus and would be contagious to other people who are not immunized or who have a compromised immune system.

## 2017-05-07 NOTE — ED Notes (Signed)
Gave pt pre-packed bag lunch ([place] sandwich and applesauce) & Ginger Ale, per Dr. Johnney Killian.

## 2017-05-07 NOTE — ED Notes (Signed)
Patient transported to X-ray 

## 2017-05-07 NOTE — ED Notes (Signed)
IV team at bedside,

## 2017-05-07 NOTE — ED Provider Notes (Signed)
[facility] DEPT Provider Note   CSN: [id number] Arrival date & time: 05/07/17  1011     History   Chief Complaint Chief Complaint  Patient presents with  . Back Pain    HPI Colin [name] is a 51 y.o. male.  HPI   Pt with hx HTN, UC, CKD III, arthritis, gout p/w worsening upper back pain and spasms that began 3 days ago.  States the pain began in his right upper back after walking his dog, was mild at the time.  Symptoms progressively worsened until he went to Urgent Care yesterday and was prescribed flexeril and lidoderm patch for muscle spasms.  States the pain now involves his entire back and bilateral sides, worse with any movement or breathing. Has been going out for daily walks in the heat, usually in the late afternoon.  Denies any other trauma, falls, or heavy lifting.  Denies fevers, CP, SOB, cough, N/V, urinary symptoms or bowel changes.  Denies medications or dietary changes.  No recent immobilization, unilateral leg swelling, or hx blood clots.    Past Medical History:  Diagnosis Date  . Anemia    iron deficiency  . Arthritis    Gout  . CKD (chronic kidney disease) stage 3, GFR 30-59 ml/min   . Gout   . History of chicken pox   . Hypertension   . Melanosis coli   . Mumps   . Pneumonia   . Ulcerative colitis [place], [place]    Patient Active Problem List   Diagnosis Date Noted  . ESRD (end stage renal disease) ([facility]) 02/07/2017  . Morbid obesity ([place]) 02/07/2017  . Bilateral leg edema 10/02/2015  . Hyperlipidemia 10/02/2015  . Edema 02/09/2011  . Chronic renal insufficiency 02/09/2011  . ANEMIA, IRON DEFICIENCY 10/16/2007  . Essential hypertension 10/16/2007  . Ulcerative colitis ([facility]) 02/13/2007  . MELANOSIS COLI 02/13/2007    Past Surgical History:  Procedure Laterality Date  . AV FISTULA PLACEMENT Right 08/27/2016   Procedure: RIGHT RADIOCEPHALIC ARTERIOVENOUS FISTULA CREATION;  Surgeon: [name], MD;  Location: [facility];  Service:  Vascular;  Laterality: Right;  . COLONOSCOPY    . FISTULA SUPERFICIALIZATION Right 11/12/2016   Procedure: FISTULA SUPERFICIALIZATION RIGHT FOREEARM;  Surgeon: [name], MD;  Location: [facility] OR;  Service: Vascular;  Laterality: Right;  . TENDON REPAIR  1987   Left index finger       Home Medications    Prior to Admission medications   Medication Sig Start Date End Date Taking? Authorizing Provider  allopurinol (ZYLOPRIM) 300 MG tablet Take 300 mg by mouth at bedtime.    Yes [provider]  atenolol (TENORMIN) 100 MG tablet Take 100 mg by mouth at bedtime.    Yes [provider]  calcitRIOL (ROCALTROL) 0.25 MCG capsule Take 0.25 mcg by mouth every Monday, Wednesday, and Friday. 04/07/17  Yes [provider]  cyclobenzaprine (FLEXERIL) 10 MG tablet Take 1 tablet (10 mg total) by mouth at bedtime as needed for muscle spasms. 05/06/17  Yes Burnard Hawthorne, FNP  furosemide (LASIX) 80 MG tablet Take 80-120 mg by mouth See admin instructions. TAke 1 1/2 tab (169m) in the morning, and 1 tab (830m in the evening   Yes [provider]  hydrALAZINE (APRESOLINE) 50 MG tablet Take 1 tablet (50 mg total) by mouth at bedtime. Patient taking differently: Take 50 mg by mouth 3 (three) times daily.  08/27/16  Yes TrVirgina Jock, PA-C  lidocaine (  LIDODERM) 5 % Place 1 patch onto the skin daily. Remove & Discard patch within 12 hours. 05/06/17  Yes Burnard Hawthorne, FNP  pravastatin (PRAVACHOL) 40 MG tablet Take 40 mg by mouth every evening.    Yes [provider]  tetrahydrozoline 0.05 % ophthalmic solution Place 1 drop into both eyes daily as needed (dry eyes).   Yes [provider]  oxyCODONE-acetaminophen (PERCOCET/ROXICET) 5-325 MG tablet Take 1-2 tablets by mouth every 4 (four) hours as needed for severe pain. 05/07/17   Clayton Bibles, PA-C  predniSONE (STERAPRED UNI-PAK 21 TAB) 10 MG (21) TBPK tablet Take by mouth daily. Day 1: take 6 tabs.  Day  2: 5 tabs  Day 3: 4 tabs  Day 4: 3 tabs  Day 5: 2 tabs  Day 6: 1 tab 05/07/17   Shauntel Prest, PA-C  valACYclovir (VALTREX) 1000 MG tablet Take 1 tablet (1,000 mg total) by mouth 3 (three) times daily. 05/07/17   Clayton Bibles, PA-C    Family History Family History  Problem Relation Age of Onset  . Hypertension Father   . Heart disease Father   . Renal Disease Father   . Hypertension Mother     Social History Social History  Substance Use Topics  . Smoking status: Never Smoker  . Smokeless tobacco: Former Systems developer    Types: Chew  . Alcohol use No     Allergies   No known allergies   Review of Systems Review of Systems  All other systems reviewed and are negative.    Physical Exam Updated Vital Signs BP (!) 126/97   Pulse 71   Temp 98.4 F (36.9 C) (Oral)   SpO2 95%   Physical Exam  Constitutional: He appears well-developed and well-nourished. No distress.  HENT:  Head: Normocephalic and atraumatic.  Neck: Neck supple.  Cardiovascular: Normal rate and regular rhythm.   Pulmonary/Chest: Effort normal and breath sounds normal. No respiratory distress. He has no wheezes. He has no rales.  Abdominal: Soft. He exhibits no distension and no mass. There is no tenderness. There is no rebound and no guarding.  Musculoskeletal: He exhibits no edema.  Spine nontender, no crepitus, or stepoffs.  No tenderness to palpation of back. Pain with movement.    Neurological: He is alert. He exhibits normal muscle tone.  Skin: He is not diaphoretic.  Nursing note and vitals reviewed.    ED Treatments / Results  Labs (all labs ordered are listed, but only abnormal results are displayed) Labs Reviewed  COMPREHENSIVE METABOLIC PANEL - Abnormal; Notable for the following:       Result Value   Potassium 3.2 (*)    Glucose, Bld 102 (*)    BUN 61 (*)    Creatinine, Ser 5.14 (*)    Albumin 3.3 (*)    GFR calc non Af Amer 12 (*)    GFR calc Af Amer 14 (*)    All other components within  normal limits  CBC WITH DIFFERENTIAL/PLATELET - Abnormal; Notable for the following:    RBC 2.98 (*)    Hemoglobin 10.0 (*)    HCT 30.2 (*)    MCV 101.3 (*)    Monocytes Absolute 1.4 (*)    All other components within normal limits  SEDIMENTATION RATE - Abnormal; Notable for the following:    Sed Rate 78 (*)    All other components within normal limits  CK - Abnormal; Notable for the following:    Total CK 628 (*)  All other components within normal limits  URINALYSIS, ROUTINE W REFLEX MICROSCOPIC - Abnormal; Notable for the following:    Hgb urine dipstick SMALL (*)    Protein, ur >=300 (*)    Bacteria, UA RARE (*)    All other components within normal limits    EKG  EKG Interpretation None       Radiology Dg Chest 2 View  Result Date: 05/07/2017 CLINICAL DATA:  Upper back pain and spasms. EXAM: CHEST  2 VIEW COMPARISON:  11/17/2008 FINDINGS: Mild cardiac enlargement. Atelectasis at the right lung base. There is no evidence of pulmonary edema, consolidation, pneumothorax, nodule or pleural fluid. Visualized bony structures are unremarkable. IMPRESSION: Mild cardiomegaly and right basilar atelectasis. Electronically Signed   By: Aletta Edouard M.D.   On: 05/07/2017 12:33   Dg Thoracic Spine 2 View  Result Date: 05/07/2017 CLINICAL DATA:  Upper back pain and spasms. EXAM: THORACIC SPINE 2 VIEWS COMPARISON:  None. FINDINGS: The thoracic spine shows no evidence of fracture or subluxation. No bony lesions. No significant degenerative disc disease. IMPRESSION: Normal thoracic spine radiographs. Electronically Signed   By: Aletta Edouard M.D.   On: 05/07/2017 12:33    Procedures Procedures (including critical care time)  Medications Ordered in ED Medications  diazepam (VALIUM) tablet 5 mg (5 mg Oral Given 05/07/17 1150)  sodium chloride 0.9 % bolus 1,000 mL (0 mLs Intravenous Stopped 05/07/17 1640)  morphine 4 MG/ML injection 4 mg (4 mg Intravenous Given 05/07/17 1311)    morphine 4 MG/ML injection 4 mg (4 mg Intravenous Given 05/07/17 1451)  HYDROmorphone (DILAUDID) injection 1 mg (1 mg Intravenous Given 05/07/17 1650)     Initial Impression / Assessment and Plan / ED Course  I have reviewed the triage vital signs and the nursing notes.  Pertinent labs & imaging results that were available during my care of the patient were reviewed by me and considered in my medical decision making (see chart for details).  Clinical Course as of May 07 1838  Sat May 07, 2017  1456 Discussed pt with Dr Joelyn Oms.  Will try muscle relaxant, hold statin x 1 week.    [EW]  1641 Dr Johnney Killian noted emerging vesicular rash over right upper back.  Will treat for zoster.  Advised of all recommendations and about precautions to take around unimmunized or immunocompromised.    [EW]    Clinical Course User Index [EW] [place], Jahari Billy, [place]    Afebrile, nontoxic patient with upper back pain and muscle spasms.  Pt has been spending time in the heat but feels he has been drinking enough fluids.  Discussed pt and workup, treatment with Dr Johnney Killian.  Has chronic kidney disease, per discussed with nephrology this renal function today is likely close to his baseline given AV fistula placement 6-7 months ago.  CK somewhat elevated.  IL IVF given.  Raised rash over right upper back where symptoms are worst seemed to develop while pt in the ED, or noticed later in ED course by Dr Johnney Killian.  Likely developing zoster.  D/C home with percocet, valacyclovir, prednisone, renal and PCP follow up.  Discussed result, findings, treatment, and follow up  with patient.  Pt given return precautions.  Pt verbalizes understanding and agrees with plan.       Final Clinical Impressions(s) / ED Diagnoses   Final diagnoses:  Herpes zoster without complication  Acute right-sided thoracic back pain  Chronic kidney disease, unspecified CKD stage  Elevated CK    New  Prescriptions Discharge Medication List as of  05/07/2017  4:53 PM    START taking these medications   Details  predniSONE (STERAPRED UNI-PAK 21 TAB) 10 MG (21) TBPK tablet Take by mouth daily. Day 1: take 6 tabs.  Day 2: 5 tabs  Day 3: 4 tabs  Day 4: 3 tabs  Day 5: 2 tabs  Day 6: 1 tab, Starting Sat 05/07/2017, Print    valACYclovir (VALTREX) 1000 MG tablet Take 1 tablet (1,000 mg total) by mouth 3 (three) times daily., Starting Sat 05/07/2017, Print         Clayton Bibles, [place] 05/07/17 1839    Charlesetta Shanks, MD 05/08/17 [phone]

## 2017-05-07 NOTE — ED Notes (Addendum)
MD and PA aware unable to obtain IV. Advised to hold off at this time. MD advised would try.

## 2017-05-07 NOTE — ED Notes (Signed)
Patient Alert and oriented X4. Stable and ambulatory. Patient verbalized understanding of the discharge instructions.  Patient belongings were taken by the patient.  

## 2017-05-07 NOTE — ED Notes (Signed)
Patient aware we need urine sample.

## 2017-05-12 DIAGNOSIS — B029 Zoster without complications: Secondary | ICD-10-CM | POA: Diagnosis not present

## 2017-05-12 DIAGNOSIS — T753XXA Motion sickness, initial encounter: Secondary | ICD-10-CM | POA: Diagnosis not present

## 2017-05-16 DIAGNOSIS — N184 Chronic kidney disease, stage 4 (severe): Secondary | ICD-10-CM | POA: Diagnosis not present

## 2017-05-16 DIAGNOSIS — D631 Anemia in chronic kidney disease: Secondary | ICD-10-CM | POA: Diagnosis not present

## 2017-05-16 DIAGNOSIS — I129 Hypertensive chronic kidney disease with stage 1 through stage 4 chronic kidney disease, or unspecified chronic kidney disease: Secondary | ICD-10-CM | POA: Diagnosis not present

## 2017-05-24 ENCOUNTER — Ambulatory Visit: Payer: Commercial Managed Care - PPO | Admitting: Vascular Surgery

## 2017-05-30 ENCOUNTER — Encounter: Payer: Self-pay | Admitting: Family

## 2017-05-31 ENCOUNTER — Ambulatory Visit: Payer: Commercial Managed Care - PPO | Admitting: Family

## 2017-06-08 DIAGNOSIS — I129 Hypertensive chronic kidney disease with stage 1 through stage 4 chronic kidney disease, or unspecified chronic kidney disease: Secondary | ICD-10-CM | POA: Diagnosis not present

## 2017-06-15 ENCOUNTER — Ambulatory Visit: Payer: Commercial Managed Care - PPO | Admitting: Endocrinology

## 2017-06-16 DIAGNOSIS — L608 Other nail disorders: Secondary | ICD-10-CM | POA: Diagnosis not present

## 2017-06-21 ENCOUNTER — Ambulatory Visit (INDEPENDENT_AMBULATORY_CARE_PROVIDER_SITE_OTHER): Payer: Commercial Managed Care - PPO | Admitting: Podiatry

## 2017-06-21 ENCOUNTER — Encounter: Payer: Self-pay | Admitting: Podiatry

## 2017-06-21 DIAGNOSIS — M79675 Pain in left toe(s): Secondary | ICD-10-CM

## 2017-06-21 DIAGNOSIS — L600 Ingrowing nail: Secondary | ICD-10-CM | POA: Diagnosis not present

## 2017-06-21 DIAGNOSIS — B351 Tinea unguium: Secondary | ICD-10-CM | POA: Diagnosis not present

## 2017-06-21 NOTE — Patient Instructions (Addendum)
Onychomycosis/Fungal Toenails  WHAT IS IT? An infection that lies within the keratin of your nail plate that is caused by a fungus.  WHY ME? Fungal infections affect all ages, sexes, races, and creeds.  There may be many factors that predispose you to a fungal infection such as age, coexisting medical conditions such as diabetes, or an autoimmune disease; stress, medications, fatigue, genetics, etc.  Bottom line: fungus thrives in a warm, moist environment and your shoes offer such a location.  IS IT CONTAGIOUS? Theoretically, yes.  You do not want to share shoes, nail clippers or files with someone who has fungal toenails.  Walking around barefoot in the same room or sleeping in the same bed is unlikely to transfer the organism.  It is important to realize, however, that fungus can spread easily from one nail to the next on the same foot.  HOW DO WE TREAT THIS?  There are several ways to treat this condition.  Treatment may depend on many factors such as age, medications, pregnancy, liver and kidney conditions, etc.  It is best to ask your doctor which options are available to you.  1. No treatment.   Unlike many other medical concerns, you can live with this condition.  However for many people this can be a painful condition and may lead to ingrown toenails or a bacterial infection.  It is recommended that you keep the nails cut short to help reduce the amount of fungal nail. 2. Topical treatment.  These range from herbal remedies to prescription strength nail lacquers.  About 40-50% effective, topicals require twice daily application for approximately 9 to 12 months or until an entirely new nail has grown out.  The most effective topicals are medical grade medications available through physicians offices. 3. Oral antifungal medications.  With an 80-90% cure rate, the most common oral medication requires 3 to 4 months of therapy and stays in your system for a year as the new nail grows out.  Oral  antifungal medications do require blood work to make sure it is a safe drug for you.  A liver function panel will be performed prior to starting the medication and after the first month of treatment.  It is important to have the blood work performed to avoid any harmful side effects.  In general, this medication safe but blood work is required. 4. Laser Therapy.  This treatment is performed by applying a specialized laser to the affected nail plate.  This therapy is noninvasive, fast, and non-painful.  It is not covered by insurance and is therefore, out of pocket.  The results have been very good with a 80-95% cure rate.  The [facility] is the only practice in the area to offer this therapy. 5. Permanent Nail Avulsion.  Removing the entire nail so that a new nail will not grow back.   Ingrown Toenail An ingrown toenail occurs when the corner or sides of your toenail grow into the surrounding skin. The big toe is most commonly affected, but it can happen to any of your toes. If your ingrown toenail is not treated, you will be at risk for infection. What are the causes? This condition may be caused by: Wearing shoes that are too small or tight. Injury or trauma, such as stubbing your toe or having your toe stepped on. Improper cutting or care of your toenails. Being born with (congenital) nail or foot abnormalities, such as having a nail that is too big for your toe.  What increases the risk? Risk factors for an ingrown toenail include: Age. Your nails tend to thicken as you get older, so ingrown nails are more common in older people. Diabetes. Cutting your toenails incorrectly. Blood circulation problems.  What are the signs or symptoms? Symptoms may include: Pain, soreness, or tenderness. Redness. Swelling. Hardening of the skin surrounding the toe.  Your ingrown toenail may be infected if there is fluid, pus, or drainage. How is this diagnosed? An ingrown toenail may be  diagnosed by medical history and physical exam. If your toenail is infected, your health care provider may test a sample of the drainage. How is this treated? Treatment depends on the severity of your ingrown toenail. Some ingrown toenails may be treated at home. More severe or infected ingrown toenails may require surgery to remove all or part of the nail. Infected ingrown toenails may also be treated with antibiotic medicines. Follow these instructions at home: If you were prescribed an antibiotic medicine, finish all of it even if you start to feel better. Soak your foot in warm soapy water for 20 minutes, 3 times per day or as directed by your health care provider. Carefully lift the edge of the nail away from the sore skin by wedging a small piece of cotton under the corner of the nail. This may help with the pain. Be careful not to cause more injury to the area. Wear shoes that fit well. If your ingrown toenail is causing you pain, try wearing sandals, if possible. Trim your toenails regularly and carefully. Do not cut them in a curved shape. Cut your toenails straight across. This prevents injury to the skin at the corners of the toenail. Keep your feet clean and dry. If you are having trouble walking and are given crutches by your health care provider, use them as directed. Do not pick at your toenail or try to remove it yourself. Take medicines only as directed by your health care provider. Keep all follow-up visits as directed by your health care provider. This is important. Contact a health care provider if: Your symptoms do not improve with treatment. Get help right away if: You have red streaks that start at your foot and go up your leg. You have a fever. You have increased redness, swelling, or pain. You have fluid, blood, or pus coming from your toenail. This information is not intended to replace advice given to you by your health care provider. Make sure you discuss any  questions you have with your health care provider. Document Released: 10/01/2000 Document Revised: 03/05/2016 Document Reviewed: 08/28/2014 Elsevier Interactive Patient Education  [employer].

## 2017-06-21 NOTE — Progress Notes (Signed)
   Subjective:    Patient ID: Colin [name], male    DOB: 1966/01/05, 51 y.o.   MRN: [id number]  HPI  51 year old male presents the also concerns of his left big toenail becoming incurvated the points on the medial aspect. He states it hurts the tip of the toenail with pressure in shoes incident ongoing. Denies any swelling or redness or any drainage. Had no recent treatment for this. He states that he thinks that he just has an "ugly toenail". He said no recent treatment for this. He has no other concerns today.   Review of Systems  All other systems reviewed and are negative.      Objective:   Physical Exam General: AAO x3, NAD  Dermatological: Bilateral hallux toenails are to be dystrophic, discolored with yellow to brown discoloration. On the left hallux toenail is incurvation on the medial aspect. There is no edema, erythema, drainage or pus there is no clinical signs of infection present today. There is no open lesions identified.  Vascular: Dorsalis Pedis artery and Posterior Tibial artery pedal pulses are 2/4 bilateral with immedate capillary fill time. There is no pain with calf compression, swelling, warmth, erythema.   Neruologic: Grossly intact via light touch bilateral. Protective threshold with Semmes Wienstein monofilament intact to all pedal sites bilateral.   Musculoskeletal: No gross boney pedal deformities bilateral. No pain, crepitus, or limitation noted with foot and ankle range of motion bilateral. Muscular strength 5/5 in all groups tested bilateral.  Gait: Unassisted, Nonantalgic.     Assessment & Plan:  51 year old male with ingrown toenail left medial hallux, onychomycosis -Treatment options discussed including all alternatives, risks, and complications -Etiology of symptoms were discussed -Discussed partial nail avulsion however he wishes to hold off on this today. I was able to sharply debride the left hallux toenail 2 without any complications or bleeding. I  discussed the treatment options for nail fungus. We'll start with topical antifungal. I ordered a compound cream today through [facility]. Discussed applications destruction as well as duration. -If symptoms continue will consider partial nail avulsion. -Follow-up as needed. Call any questions or concerns.  Celesta Gentile, DPM

## 2017-06-22 DIAGNOSIS — E291 Testicular hypofunction: Secondary | ICD-10-CM | POA: Diagnosis not present

## 2017-06-24 DIAGNOSIS — E559 Vitamin D deficiency, unspecified: Secondary | ICD-10-CM | POA: Diagnosis not present

## 2017-06-24 DIAGNOSIS — E782 Mixed hyperlipidemia: Secondary | ICD-10-CM | POA: Diagnosis not present

## 2017-06-28 ENCOUNTER — Telehealth: Payer: Self-pay

## 2017-06-28 ENCOUNTER — Encounter: Payer: Self-pay | Admitting: Family

## 2017-06-28 NOTE — Telephone Encounter (Signed)
3 rd attempt to reach patient , pcp made [email or web address]

## 2017-06-30 ENCOUNTER — Ambulatory Visit: Payer: Commercial Managed Care - PPO | Admitting: Family

## 2017-07-01 DIAGNOSIS — H6122 Impacted cerumen, left ear: Secondary | ICD-10-CM | POA: Diagnosis not present

## 2017-07-01 DIAGNOSIS — H6092 Unspecified otitis externa, left ear: Secondary | ICD-10-CM | POA: Diagnosis not present

## 2017-07-01 DIAGNOSIS — Z23 Encounter for immunization: Secondary | ICD-10-CM | POA: Diagnosis not present

## 2017-07-05 ENCOUNTER — Encounter: Payer: Self-pay | Admitting: Family

## 2017-07-06 ENCOUNTER — Ambulatory Visit (INDEPENDENT_AMBULATORY_CARE_PROVIDER_SITE_OTHER): Payer: Commercial Managed Care - PPO | Admitting: Family

## 2017-07-06 ENCOUNTER — Encounter: Payer: Self-pay | Admitting: Family

## 2017-07-06 VITALS — BP 143/99 | HR 64 | Temp 97.8°F | Resp 18 | Ht 65.0 in | Wt 240.6 lb

## 2017-07-06 DIAGNOSIS — T82898A Other specified complication of vascular prosthetic devices, implants and grafts, initial encounter: Secondary | ICD-10-CM

## 2017-07-06 DIAGNOSIS — D631 Anemia in chronic kidney disease: Secondary | ICD-10-CM | POA: Diagnosis not present

## 2017-07-06 DIAGNOSIS — N184 Chronic kidney disease, stage 4 (severe): Secondary | ICD-10-CM

## 2017-07-06 DIAGNOSIS — I129 Hypertensive chronic kidney disease with stage 1 through stage 4 chronic kidney disease, or unspecified chronic kidney disease: Secondary | ICD-10-CM | POA: Diagnosis not present

## 2017-07-06 NOTE — Progress Notes (Signed)
Established Dialysis Access  History of Present Illness  Colin [name] is a 51 y.o. (Mar 13, 1966) male who is s/p who is s/p Translocation of cephalic vein radiocephalic fistula on 6-38-46 by Dr. Donnetta Hutching.   He was last evaluated on 12-14-16 by Colin [name]. At that time the fistula was ready for immediate use when the time comes for HD.  Colin [name] advised pt to check a palpable thrill in the fistula daily.  If he has problems or concerns he will call.  Otherwise f/u PRN.  Pt returns today he states at the request of his nephrologist, re the AV fistula does not seem viable.   Pt has not yet started hemodialysis.   The patient is left hand dominant.  He denies any steal sx's in his right upper extremity.   He denies claudication sx's with walking.   He denies any known personal or family hs of DM.   Past Medical History:  Diagnosis Date  . Anemia    iron deficiency  . Arthritis    Gout  . CKD (chronic kidney disease) stage 3, GFR 30-59 ml/min   . Gout   . History of chicken pox   . Hypertension   . Melanosis coli   . Mumps   . Pneumonia   . Ulcerative colitis [place], [place]    Social History Social History  Substance Use Topics  . Smoking status: Never Smoker  . Smokeless tobacco: Former Systems developer    Types: Chew  . Alcohol use Yes     Comment: Occas.     Family History Family History  Problem Relation Age of Onset  . Hypertension Father   . Heart disease Father   . Renal Disease Father   . Hypertension Mother     Surgical History Past Surgical History:  Procedure Laterality Date  . AV FISTULA PLACEMENT Right 08/27/2016   Procedure: RIGHT RADIOCEPHALIC ARTERIOVENOUS FISTULA CREATION;  Surgeon: [name], MD;  Location: [facility];  Service: Vascular;  Laterality: Right;  . COLONOSCOPY    . FISTULA SUPERFICIALIZATION Right 11/12/2016   Procedure: FISTULA SUPERFICIALIZATION RIGHT FOREEARM;  Surgeon: [name], MD;  Location: [facility] OR;  Service: Vascular;   Laterality: Right;  . TENDON REPAIR  1987   Left index finger    Allergies  Allergen Reactions  . No Known Allergies     Current Outpatient Prescriptions  Medication Sig Dispense Refill  . allopurinol (ZYLOPRIM) 300 MG tablet Take 300 mg by mouth at bedtime.     Marland Kitchen atenolol (TENORMIN) 100 MG tablet Take 100 mg by mouth at bedtime.     . calcitRIOL (ROCALTROL) 0.25 MCG capsule Take 0.25 mcg by mouth every Monday, Wednesday, and Friday.  5  . furosemide (LASIX) 80 MG tablet Take 80-120 mg by mouth See admin instructions. TAke 1 1/2 tab (158m) in the morning, and 1 tab (857m in the evening    . hydrALAZINE (APRESOLINE) 50 MG tablet Take 1 tablet (50 mg total) by mouth at bedtime. (Patient taking differently: Take 50 mg by mouth 3 (three) times daily. )    . NON FORMULARY [facility]  Onychomycosis Nail Lacquer -  Fluconazole 2%, Terbinafine 1% DMSO Apply to affected nail once daily Qty. 120 gm 3 refills    . pravastatin (PRAVACHOL) 40 MG tablet Take 40 mg by mouth every evening.     . Marland Kitchenetrahydrozoline 0.05 % ophthalmic solution Place 1 drop into both eyes daily as needed (dry  eyes).     No current facility-administered medications for this visit.      REVIEW OF SYSTEMS: see HPI for pertinent positives and negatives    PHYSICAL EXAMINATION:  Vitals:   07/06/17 1617  BP: (!) 143/99  Pulse: 64  Resp: 18  Temp: 97.8 F (36.6 C)  TempSrc: Oral  SpO2: 97%  Weight: 240 lb 9.6 oz (109.1 kg)  Height: 5' 5"  (1.651 m)   Body mass index is 40.04 kg/m.  General: Morbidly obese male appears his stated age.   HEENT:  No gross abnormalities. Acanthosis nigricans encircling neck.  Pulmonary: Respirations are non-labored, BBS CTAB.  Abdomen: Soft and non-tender with normal pitched bowel sounds.  Musculoskeletal: There are no major deformities.   Neurologic: No focal weakness or paresthesias are detected. Hand grip is 5/5 bilaterally. Sensation in both hands is intact.  Skin:  There are no ulcer or rashes noted. Psychiatric: The patient has normal affect. Cardiovascular: There is a regular rate and rhythm without significant murmur appreciated. There is no palpable thrill and no audible bruit at the right arm AV fistula. Bilateral radial pulses are 2+ palpable.    Medical Decision Making  Colin [name] is a 51 y.o. male who presents with stage 4 to 5 CKD. The right radio cephalic AV fistula no longer has a palpable thrill nor audible bruit.  Dr. Scot Dock spoke with pt and examined him. He gave pt the option of vein mapping for evaluation of new AV fistula site, or to wait and if he needs HD imminently a temporary catheter may be placed to accomplish this.  Will schedule him for vein mapping and to follow up with one of the vascular surgeons to discuss options.    Colin [name], Colin Leyden, RN, MSN, FNP-C [facility] Office: [phone]  07/06/2017, 4:42 PM  Clinic MD: Scot Dock

## 2017-07-07 NOTE — Addendum Note (Signed)
Addended by: Lianne Cure A on: 07/07/2017 03:48 PM   Modules accepted: Orders

## 2017-07-12 DIAGNOSIS — N2581 Secondary hyperparathyroidism of renal origin: Secondary | ICD-10-CM | POA: Diagnosis not present

## 2017-07-12 DIAGNOSIS — D631 Anemia in chronic kidney disease: Secondary | ICD-10-CM | POA: Diagnosis not present

## 2017-07-12 DIAGNOSIS — N184 Chronic kidney disease, stage 4 (severe): Secondary | ICD-10-CM | POA: Diagnosis not present

## 2017-07-15 ENCOUNTER — Encounter (HOSPITAL_COMMUNITY): Payer: Commercial Managed Care - PPO

## 2017-07-15 ENCOUNTER — Ambulatory Visit: Payer: Commercial Managed Care - PPO | Admitting: Vascular Surgery

## 2017-07-20 ENCOUNTER — Encounter: Payer: Self-pay | Admitting: Vascular Surgery

## 2017-07-22 ENCOUNTER — Encounter (HOSPITAL_COMMUNITY): Payer: Commercial Managed Care - PPO

## 2017-07-22 ENCOUNTER — Ambulatory Visit: Payer: Commercial Managed Care - PPO | Admitting: Vascular Surgery

## 2017-08-03 DIAGNOSIS — I10 Essential (primary) hypertension: Secondary | ICD-10-CM | POA: Diagnosis not present

## 2017-08-17 DIAGNOSIS — N19 Unspecified kidney failure: Secondary | ICD-10-CM | POA: Diagnosis not present

## 2017-08-17 DIAGNOSIS — K5190 Ulcerative colitis, unspecified, without complications: Secondary | ICD-10-CM | POA: Diagnosis not present

## 2017-08-17 DIAGNOSIS — I10 Essential (primary) hypertension: Secondary | ICD-10-CM | POA: Diagnosis not present

## 2017-08-19 DIAGNOSIS — K5190 Ulcerative colitis, unspecified, without complications: Secondary | ICD-10-CM | POA: Diagnosis not present

## 2017-09-01 DIAGNOSIS — N184 Chronic kidney disease, stage 4 (severe): Secondary | ICD-10-CM | POA: Diagnosis not present

## 2017-09-01 DIAGNOSIS — N2581 Secondary hyperparathyroidism of renal origin: Secondary | ICD-10-CM | POA: Diagnosis not present

## 2017-09-01 DIAGNOSIS — D631 Anemia in chronic kidney disease: Secondary | ICD-10-CM | POA: Diagnosis not present

## 2017-09-01 DIAGNOSIS — N185 Chronic kidney disease, stage 5: Secondary | ICD-10-CM | POA: Diagnosis not present

## 2017-09-01 DIAGNOSIS — I120 Hypertensive chronic kidney disease with stage 5 chronic kidney disease or end stage renal disease: Secondary | ICD-10-CM | POA: Diagnosis not present

## 2017-09-12 DIAGNOSIS — M1A00X0 Idiopathic chronic gout, unspecified site, without tophus (tophi): Secondary | ICD-10-CM | POA: Diagnosis not present

## 2017-09-12 DIAGNOSIS — N189 Chronic kidney disease, unspecified: Secondary | ICD-10-CM | POA: Diagnosis not present

## 2017-09-12 DIAGNOSIS — M25522 Pain in left elbow: Secondary | ICD-10-CM | POA: Diagnosis not present

## 2017-09-14 ENCOUNTER — Ambulatory Visit: Payer: Commercial Managed Care - PPO | Admitting: Podiatry

## 2017-09-14 DIAGNOSIS — K5190 Ulcerative colitis, unspecified, without complications: Secondary | ICD-10-CM | POA: Diagnosis not present

## 2017-09-15 ENCOUNTER — Ambulatory Visit: Payer: Commercial Managed Care - PPO | Admitting: Podiatry

## 2017-09-19 ENCOUNTER — Ambulatory Visit: Payer: Commercial Managed Care - PPO

## 2017-09-19 ENCOUNTER — Encounter: Payer: Self-pay | Admitting: Podiatry

## 2017-09-19 ENCOUNTER — Ambulatory Visit (INDEPENDENT_AMBULATORY_CARE_PROVIDER_SITE_OTHER): Payer: Commercial Managed Care - PPO | Admitting: Podiatry

## 2017-09-19 DIAGNOSIS — L600 Ingrowing nail: Secondary | ICD-10-CM | POA: Diagnosis not present

## 2017-09-19 MED ORDER — GENTAMICIN SULFATE 0.1 % EX CREA: 1.0000 "application " | TOPICAL_CREAM | Freq: Three times a day (TID) | CUTANEOUS | 1 refills | Status: DC

## 2017-09-19 NOTE — Patient Instructions (Signed)

## 2017-09-21 NOTE — Progress Notes (Signed)
   Subjective: Patient presents today for evaluation of pain to the medial border of the left great toe that began 3 months ago.  He reports associated redness and swelling of the area.  Patient is concerned for possible ingrown nail. Patient presents today for further treatment and evaluation. Medial border of the left great toe  Past Medical History:  Diagnosis Date  . Anemia    iron deficiency  . Arthritis    Gout  . CKD (chronic kidney disease) stage 3, GFR 30-59 ml/min (HCC)   . Gout   . History of chicken pox   . Hypertension   . Melanosis coli   . Mumps   . Pneumonia   . Ulcerative colitis [place], [place]    Objective:  General: Well developed, nourished, in no acute distress, alert and oriented x3   Dermatology: Skin is warm, dry and supple bilateral.  Medial border of the left great toe appears to be erythematous with evidence of an ingrowing nail. Pain on palpation noted to the border of the nail fold. The remaining nails appear unremarkable at this time. There are no open sores, lesions.  Vascular: Dorsalis Pedis artery and Posterior Tibial artery pedal pulses palpable. No lower extremity edema noted.   Neruologic: Grossly intact via light touch bilateral.  Musculoskeletal: Muscular strength within normal limits in all groups bilateral. Normal range of motion noted to all pedal and ankle joints.   Assesement: #1 Paronychia with ingrowing nail medial border of the left great toe #2 Pain in toe #3 Incurvated nail  Plan of Care:  1. Patient evaluated.  2. Discussed treatment alternatives and plan of care. Explained nail avulsion procedure and post procedure course to patient. 3. Patient opted for permanent partial nail avulsion.  4. Prior to procedure, local anesthesia infiltration utilized using 3 ml of a 50:50 mixture of 2% plain lidocaine and 0.5% plain marcaine in a normal hallux block fashion and a betadine prep performed.  5. Partial permanent nail  avulsion with chemical matrixectomy performed using [id number] applications of phenol followed by alcohol flush.  6. Light dressing applied. 7.  Prescription for gentamicin cream provided to patient. 8.  Postop shoe dispensed. 9.  Return to clinic in 2 weeks.   Edrick Kins, DPM [facility]  Dr. Edrick Kins, [address]                                        [place], [place] [address]                Office [phone]  Fax [phone]

## 2017-10-04 DIAGNOSIS — I10 Essential (primary) hypertension: Secondary | ICD-10-CM | POA: Diagnosis not present

## 2017-10-05 ENCOUNTER — Ambulatory Visit: Payer: Commercial Managed Care - PPO | Admitting: Podiatry

## 2017-10-12 ENCOUNTER — Other Ambulatory Visit: Payer: Self-pay | Admitting: *Deleted

## 2017-10-12 ENCOUNTER — Encounter: Payer: Self-pay | Admitting: Surgery

## 2017-10-12 ENCOUNTER — Other Ambulatory Visit: Payer: Self-pay

## 2017-10-12 ENCOUNTER — Encounter: Payer: Self-pay | Admitting: *Deleted

## 2017-10-12 ENCOUNTER — Ambulatory Visit (INDEPENDENT_AMBULATORY_CARE_PROVIDER_SITE_OTHER)
Admission: RE | Admit: 2017-10-12 | Discharge: 2017-10-12 | Disposition: A | Discharge status: Home / Self Care | Payer: Commercial Managed Care - PPO | Source: Ambulatory Visit | Attending: Surgery | Admitting: Surgery

## 2017-10-12 ENCOUNTER — Ambulatory Visit (HOSPITAL_COMMUNITY)
Admission: RE | Admit: 2017-10-12 | Discharge: 2017-10-12 | Disposition: A | Discharge status: Home / Self Care | Payer: Commercial Managed Care - PPO | Source: Ambulatory Visit | Attending: Family | Admitting: Family

## 2017-10-12 ENCOUNTER — Ambulatory Visit (INDEPENDENT_AMBULATORY_CARE_PROVIDER_SITE_OTHER): Payer: Commercial Managed Care - PPO | Admitting: Surgery

## 2017-10-12 VITALS — HR 65 | Temp 97.7°F | Resp 16 | Ht 65.0 in | Wt 243.0 lb

## 2017-10-12 DIAGNOSIS — N186 End stage renal disease: Secondary | ICD-10-CM

## 2017-10-12 DIAGNOSIS — N184 Chronic kidney disease, stage 4 (severe): Secondary | ICD-10-CM

## 2017-10-12 DIAGNOSIS — T82898A Other specified complication of vascular prosthetic devices, implants and grafts, initial encounter: Secondary | ICD-10-CM

## 2017-10-12 NOTE — H&P (View-Only) (Signed)
History of Present Illness:  Patient is a 51 y.o. year old male who presents for placement of a permanent hemodialysis access. He had a right radiocephalic creation with translocation that ultimaly failed.  The patient is left handed .  The patient is not currently on hemodialysis.  The cause of renal failure is thought to be secondary to hypertention .  Other chronic medical problems include HTN and hyperlipidemia.  He is medically managed on Atenolol and Pravastatin.    Past Medical History:  Diagnosis Date  . Anemia    iron deficiency  . Arthritis    Gout  . CKD (chronic kidney disease) stage 3, GFR 30-59 ml/min (HCC)   . Gout   . History of chicken pox   . Hypertension   . Melanosis coli   . Mumps   . Pneumonia   . Ulcerative colitis [place], [place]    Past Surgical History:  Procedure Laterality Date  . AV FISTULA PLACEMENT Right 08/27/2016   Procedure: RIGHT RADIOCEPHALIC ARTERIOVENOUS FISTULA CREATION;  Surgeon: [name], MD;  Location: [facility];  Service: Vascular;  Laterality: Right;  . COLONOSCOPY    . FISTULA SUPERFICIALIZATION Right 11/12/2016   Procedure: FISTULA SUPERFICIALIZATION RIGHT FOREEARM;  Surgeon: [name], MD;  Location: [facility] OR;  Service: Vascular;  Laterality: Right;  . TENDON REPAIR  1987   Left index finger     Social History Social History   Tobacco Use  . Smoking status: Never Smoker  . Smokeless tobacco: Former Systems developer    Types: Chew  Substance Use Topics  . Alcohol use: Yes    Comment: Occas.   . Drug use: No    Family History Family History  Problem Relation Age of Onset  . Hypertension Father   . Heart disease Father   . Renal Disease Father   . Hypertension Mother     Allergies  Allergies  Allergen Reactions  . No Known Allergies      Current Outpatient Medications  Medication Sig Dispense Refill  . allopurinol (ZYLOPRIM) 300 MG tablet Take 300 mg by mouth at bedtime.     Marland Kitchen atenolol (TENORMIN) 100  MG tablet Take 100 mg by mouth at bedtime.     . calcitRIOL (ROCALTROL) 0.25 MCG capsule Take 0.25 mcg by mouth every Monday, Wednesday, and Friday.  5  . furosemide (LASIX) 80 MG tablet Take 80-120 mg by mouth See admin instructions. TAke 1 1/2 tab (115m) in the morning, and 1 tab (870m in the evening    . gentamicin cream (GARAMYCIN) 0.1 % Apply 1 application topically 3 (three) times daily. 30 g 1  . hydrALAZINE (APRESOLINE) 50 MG tablet Take 1 tablet (50 mg total) by mouth at bedtime. (Patient taking differently: Take 50 mg by mouth 3 (three) times daily. )    . NON FORMULARY [facility]  Onychomycosis Nail Lacquer -  Fluconazole 2%, Terbinafine 1% DMSO Apply to affected nail once daily Qty. 120 gm 3 refills    . pravastatin (PRAVACHOL) 40 MG tablet Take 40 mg by mouth every evening.     . Marland Kitchenetrahydrozoline 0.05 % ophthalmic solution Place 1 drop into both eyes daily as needed (dry eyes).     No current facility-administered medications for this visit.     ROS:   General:  No weight loss, Fever, chills  HEENT: No recent headaches, no nasal bleeding, no visual changes, no sore throat  Neurologic: No  dizziness, blackouts, seizures. No recent symptoms of stroke or mini- stroke. No recent episodes of slurred speech, or temporary blindness.  Cardiac: No recent episodes of chest pain/pressure, no shortness of breath at rest.  No shortness of breath with exertion.  Denies history of atrial fibrillation or irregular heartbeat  Vascular: No history of rest pain in feet.  No history of claudication.  No history of non-healing ulcer, No history of DVT   Pulmonary: No home oxygen, no productive cough, no hemoptysis,  No asthma or wheezing  Musculoskeletal:  [ ]  Arthritis, [ ]  Low back pain,  [ ]  Joint pain  Hematologic:No history of hypercoagulable state.  No history of easy bleeding.  No history of anemia  Gastrointestinal: No hematochezia or melena,  No gastroesophageal reflux, no  trouble swallowing  Urinary: [x ] chronic Kidney disease, [ ]  on HD - [ ]  MWF or [ ]  TTHS, [ ]  Burning with urination, [ ]  Frequent urination, [ ]  Difficulty urinating;   Skin: No rashes  Psychological: No history of anxiety,  No history of depression   Physical Examination  Vitals:   10/12/17 1220  BP: (!) 162/102  Pulse: 65  Resp: 16  Temp: 97.7 F (36.5 C)  TempSrc: Oral  SpO2: 99%  Weight: 243 lb (110.2 kg)  Height: 5' 5"  (1.651 m)    Body mass index is 40.44 kg/m.  General:  Alert and oriented, no acute distress HEENT: Normal Neck: No bruit or JVD Pulmonary: Clear to auscultation bilaterally Cardiac: Regular Rate and Rhythm without murmur Gastrointestinal: Soft, non-tender, non-distended, no mass, no scars Skin: No rash, well healed right forearm scars Extremity Pulses:  2+ radial, brachial pulses bilaterally, no palpable thrill in the right forearm fistula. Musculoskeletal: No deformity or edema  Neurologic: Upper and lower extremity motor 5/5 and symmetric  DATA:  Vein mapping shows an acceptable UE cephalic as well as basilic veins. Arterial duplex shows triphasic flow B UE   ASSESSMENT:  CKD not currently on HD   PLAN: He has acceptable right UE veins and arterial flow is triphasic.  We will schedule him for right UE brachial cephalic verses basilic av fistula creation.  There maybe clot in the distal cephalic from the previous fistula failure because it was not well visualized on vein mapping.  The patient is in agreement with this plan.    Roxy Horseman PA-C [facility]  The patient was seen in conjunction with Dr. Trula Slade today  I agree with the above.  I have seen and evaluated the patient.  This is a 51 year old gentleman who has previously undergone a right radiocephalic fistula with elevation to Dr. early.  He comes in today because his fistula has occluded.  He is not yet on dialysis.  He is left-handed.   I have reviewed his vein mapping.  He appears to have adequate right basilic vein and potentially an adequate right cephalic vein however it appears to be thrombosed in the proximal forearm and not visualized in the antecubital fossa.  I discussed with the patient that I have recommended a right arm fistula creation.  This potentially could be a right brachiocephalic fistula, however the not visualized segment in the antecubital crease will need to be evaluated in the operating room.  He potentially could be a candidate for a right basilic vein fistula as well.  I told him that he will more than likely require elevation of his fistula.  This may need to  be done in a separate setting.  This will be a decision made at the time of surgery.  Annamarie Major

## 2017-10-12 NOTE — Progress Notes (Signed)
History of Present Illness:  Patient is a 51 y.o. year old male who presents for placement of a permanent hemodialysis access. He had a right radiocephalic creation with translocation that ultimaly failed.  The patient is left handed .  The patient is not currently on hemodialysis.  The cause of renal failure is thought to be secondary to hypertention .  Other chronic medical problems include HTN and hyperlipidemia.  He is medically managed on Atenolol and Pravastatin.    Past Medical History:  Diagnosis Date  . Anemia    iron deficiency  . Arthritis    Gout  . CKD (chronic kidney disease) stage 3, GFR 30-59 ml/min (HCC)   . Gout   . History of chicken pox   . Hypertension   . Melanosis coli   . Mumps   . Pneumonia   . Ulcerative colitis [place], [place]    Past Surgical History:  Procedure Laterality Date  . AV FISTULA PLACEMENT Right 08/27/2016   Procedure: RIGHT RADIOCEPHALIC ARTERIOVENOUS FISTULA CREATION;  Surgeon: [name], MD;  Location: [place];  Service: Vascular;  Laterality: Right;  . COLONOSCOPY    . FISTULA SUPERFICIALIZATION Right 11/12/2016   Procedure: FISTULA SUPERFICIALIZATION RIGHT FOREEARM;  Surgeon: [name], MD;  Location: [facility];  Service: Vascular;  Laterality: Right;  . TENDON REPAIR  1987   Left index finger     Social History Social History   Tobacco Use  . Smoking status: Never Smoker  . Smokeless tobacco: Former Systems developer    Types: Chew  Substance Use Topics  . Alcohol use: Yes    Comment: Occas.   . Drug use: No    Family History Family History  Problem Relation Age of Onset  . Hypertension Father   . Heart disease Father   . Renal Disease Father   . Hypertension Mother     Allergies  Allergies  Allergen Reactions  . No Known Allergies      Current Outpatient Medications  Medication Sig Dispense Refill  . allopurinol (ZYLOPRIM) 300 MG tablet Take 300 mg by mouth at bedtime.     Marland Kitchen atenolol (TENORMIN) 100  MG tablet Take 100 mg by mouth at bedtime.     . calcitRIOL (ROCALTROL) 0.25 MCG capsule Take 0.25 mcg by mouth every Monday, Wednesday, and Friday.  5  . furosemide (LASIX) 80 MG tablet Take 80-120 mg by mouth See admin instructions. TAke 1 1/2 tab (177m) in the morning, and 1 tab (8109m in the evening    . gentamicin cream (GARAMYCIN) 0.1 % Apply 1 application topically 3 (three) times daily. 30 g 1  . hydrALAZINE (APRESOLINE) 50 MG tablet Take 1 tablet (50 mg total) by mouth at bedtime. (Patient taking differently: Take 50 mg by mouth 3 (three) times daily. )    . NON FORMULARY [facility]  Onychomycosis Nail Lacquer -  Fluconazole 2%, Terbinafine 1% DMSO Apply to affected nail once daily Qty. 120 gm 3 refills    . pravastatin (PRAVACHOL) 40 MG tablet Take 40 mg by mouth every evening.     . Marland Kitchenetrahydrozoline 0.05 % ophthalmic solution Place 1 drop into both eyes daily as needed (dry eyes).     No current facility-administered medications for this visit.     ROS:   General:  No weight loss, Fever, chills  HEENT: No recent headaches, no nasal bleeding, no visual changes, no sore throat  Neurologic: No  dizziness, blackouts, seizures. No recent symptoms of stroke or mini- stroke. No recent episodes of slurred speech, or temporary blindness.  Cardiac: No recent episodes of chest pain/pressure, no shortness of breath at rest.  No shortness of breath with exertion.  Denies history of atrial fibrillation or irregular heartbeat  Vascular: No history of rest pain in feet.  No history of claudication.  No history of non-healing ulcer, No history of DVT   Pulmonary: No home oxygen, no productive cough, no hemoptysis,  No asthma or wheezing  Musculoskeletal:  [ ]  Arthritis, [ ]  Low back pain,  [ ]  Joint pain  Hematologic:No history of hypercoagulable state.  No history of easy bleeding.  No history of anemia  Gastrointestinal: No hematochezia or melena,  No gastroesophageal reflux, no  trouble swallowing  Urinary: [x ] chronic Kidney disease, [ ]  on HD - [ ]  MWF or [ ]  TTHS, [ ]  Burning with urination, [ ]  Frequent urination, [ ]  Difficulty urinating;   Skin: No rashes  Psychological: No history of anxiety,  No history of depression   Physical Examination  Vitals:   10/12/17 1220  BP: (!) 162/102  Pulse: 65  Resp: 16  Temp: 97.7 F (36.5 C)  TempSrc: Oral  SpO2: 99%  Weight: 243 lb (110.2 kg)  Height: 5' 5"  (1.651 m)    Body mass index is 40.44 kg/m.  General:  Alert and oriented, no acute distress HEENT: Normal Neck: No bruit or JVD Pulmonary: Clear to auscultation bilaterally Cardiac: Regular Rate and Rhythm without murmur Gastrointestinal: Soft, non-tender, non-distended, no mass, no scars Skin: No rash, well healed right forearm scars Extremity Pulses:  2+ radial, brachial pulses bilaterally, no palpable thrill in the right forearm fistula. Musculoskeletal: No deformity or edema  Neurologic: Upper and lower extremity motor 5/5 and symmetric  DATA:  Vein mapping shows an acceptable UE cephalic as well as basilic veins. Arterial duplex shows triphasic flow B UE   ASSESSMENT:  CKD not currently on HD   PLAN: He has acceptable right UE veins and arterial flow is triphasic.  We will schedule him for right UE brachial cephalic verses basilic av fistula creation.  There maybe clot in the distal cephalic from the previous fistula failure because it was not well visualized on vein mapping.  The patient is in agreement with this plan.    Roxy Horseman PA-C [facility]  The patient was seen in conjunction with Dr. Trula Slade today  I agree with the above.  I have seen and evaluated the patient.  This is a 51 year old gentleman who has previously undergone a right radiocephalic fistula with elevation to Dr. early.  He comes in today because his fistula has occluded.  He is not yet on dialysis.  He is left-handed.   I have reviewed his vein mapping.  He appears to have adequate right basilic vein and potentially an adequate right cephalic vein however it appears to be thrombosed in the proximal forearm and not visualized in the antecubital fossa.  I discussed with the patient that I have recommended a right arm fistula creation.  This potentially could be a right brachiocephalic fistula, however the not visualized segment in the antecubital crease will need to be evaluated in the operating room.  He potentially could be a candidate for a right basilic vein fistula as well.  I told him that he will more than likely require elevation of his fistula.  This may need to  be done in a separate setting.  This will be a decision made at the time of surgery.  Annamarie Major

## 2017-10-17 ENCOUNTER — Encounter: Payer: Commercial Managed Care - PPO | Admitting: Podiatry

## 2017-10-19 ENCOUNTER — Other Ambulatory Visit: Payer: Self-pay

## 2017-10-19 ENCOUNTER — Encounter (HOSPITAL_COMMUNITY): Payer: Self-pay | Admitting: *Deleted

## 2017-10-19 NOTE — Progress Notes (Signed)
Spoke with pt for pre-op call. Pt denies cardiac history, chest pain, sob or diabetes.

## 2017-10-20 NOTE — Anesthesia Preprocedure Evaluation (Addendum)
Anesthesia Evaluation  Patient identified by MRN, date of birth, ID band Patient awake    Reviewed: Allergy & Precautions, NPO status , Patient's Chart, lab work & pertinent test results, reviewed documented beta blocker date and time   History of Anesthesia Complications Negative for: history of anesthetic complications  Airway Mallampati: II  TM Distance: >3 FB Neck ROM: Full    Dental  (+) Missing, Dental Advisory Given   Pulmonary neg pulmonary ROS,    breath sounds clear to auscultation       Cardiovascular hypertension, Pt. on medications and Pt. on home beta blockers (-) angina Rhythm:Regular Rate:Normal  '15 ECHO: EF 55-60%, valves OK   Neuro/Psych negative neurological ROS     GI/Hepatic negative GI ROS, Neg liver ROS,   Endo/Other  negative endocrine ROSMorbid obesity  Renal/GU Renal diseaseNo dialysis yet     Musculoskeletal   Abdominal (+) + obese,   Peds  Hematology  (+) Blood dyscrasia (Hb 9.2), ,   Anesthesia Other Findings   Reproductive/Obstetrics                            Anesthesia Physical Anesthesia Plan  ASA: III  Anesthesia Plan: MAC   Post-op Pain Management:    Induction:   PONV Risk Score and Plan: 1 and Ondansetron  Airway Management Planned: Natural Airway and Simple Face Mask  Additional Equipment:   Intra-op Plan:   Post-operative Plan:   Informed Consent: I have reviewed the patients History and Physical, chart, labs and discussed the procedure including the risks, benefits and alternatives for the proposed anesthesia with the patient or authorized representative who has indicated his/her understanding and acceptance.   Dental advisory given  Plan Discussed with: CRNA and Surgeon  Anesthesia Plan Comments: (Plan routine monitors, MAC)        Anesthesia Quick Evaluation

## 2017-10-21 ENCOUNTER — Encounter (HOSPITAL_COMMUNITY): Payer: Self-pay

## 2017-10-21 ENCOUNTER — Ambulatory Visit (HOSPITAL_COMMUNITY): Payer: Commercial Managed Care - PPO | Admitting: Certified Registered"

## 2017-10-21 ENCOUNTER — Encounter (HOSPITAL_COMMUNITY)
Admission: RE | Disposition: A | Discharge status: Home / Self Care | Payer: Self-pay | Source: Ambulatory Visit | Attending: Vascular Surgery

## 2017-10-21 ENCOUNTER — Ambulatory Visit (HOSPITAL_COMMUNITY)
Admission: RE | Admit: 2017-10-21 | Discharge: 2017-10-21 | Disposition: A | Discharge status: Home / Self Care | Payer: Commercial Managed Care - PPO | Source: Ambulatory Visit | Attending: Vascular Surgery | Admitting: Vascular Surgery

## 2017-10-21 ENCOUNTER — Other Ambulatory Visit: Payer: Self-pay

## 2017-10-21 DIAGNOSIS — N185 Chronic kidney disease, stage 5: Secondary | ICD-10-CM | POA: Diagnosis not present

## 2017-10-21 DIAGNOSIS — M1990 Unspecified osteoarthritis, unspecified site: Secondary | ICD-10-CM | POA: Insufficient documentation

## 2017-10-21 DIAGNOSIS — Z79899 Other long term (current) drug therapy: Secondary | ICD-10-CM | POA: Insufficient documentation

## 2017-10-21 DIAGNOSIS — M109 Gout, unspecified: Secondary | ICD-10-CM | POA: Diagnosis not present

## 2017-10-21 DIAGNOSIS — Z8249 Family history of ischemic heart disease and other diseases of the circulatory system: Secondary | ICD-10-CM | POA: Diagnosis not present

## 2017-10-21 DIAGNOSIS — Z6841 Body Mass Index (BMI) 40.0 and over, adult: Secondary | ICD-10-CM | POA: Insufficient documentation

## 2017-10-21 DIAGNOSIS — K5190 Ulcerative colitis, unspecified, without complications: Secondary | ICD-10-CM | POA: Diagnosis not present

## 2017-10-21 DIAGNOSIS — I129 Hypertensive chronic kidney disease with stage 1 through stage 4 chronic kidney disease, or unspecified chronic kidney disease: Secondary | ICD-10-CM | POA: Insufficient documentation

## 2017-10-21 DIAGNOSIS — I120 Hypertensive chronic kidney disease with stage 5 chronic kidney disease or end stage renal disease: Secondary | ICD-10-CM | POA: Diagnosis not present

## 2017-10-21 DIAGNOSIS — D509 Iron deficiency anemia, unspecified: Secondary | ICD-10-CM | POA: Diagnosis not present

## 2017-10-21 DIAGNOSIS — N183 Chronic kidney disease, stage 3 unspecified: Secondary | ICD-10-CM

## 2017-10-21 DIAGNOSIS — Z48812 Encounter for surgical aftercare following surgery on the circulatory system: Secondary | ICD-10-CM

## 2017-10-21 DIAGNOSIS — E785 Hyperlipidemia, unspecified: Secondary | ICD-10-CM | POA: Diagnosis not present

## 2017-10-21 DIAGNOSIS — N186 End stage renal disease: Secondary | ICD-10-CM | POA: Diagnosis not present

## 2017-10-21 HISTORY — PX: AV FISTULA PLACEMENT: SHX1204

## 2017-10-21 LAB — URINALYSIS, ROUTINE W REFLEX MICROSCOPIC
Bilirubin Urine: NEGATIVE
GLUCOSE, UA: NEGATIVE mg/dL
HGB URINE DIPSTICK: NEGATIVE
KETONES UR: NEGATIVE mg/dL
Leukocytes, UA: NEGATIVE
Nitrite: NEGATIVE
Specific Gravity, Urine: 1.011 (ref 1.005–1.030)
Squamous Epithelial / LPF: NONE SEEN
pH: 6 (ref 5.0–8.0)

## 2017-10-21 LAB — POCT I-STAT 4, (NA,K, GLUC, HGB,HCT)
Glucose, Bld: 101 mg/dL — ABNORMAL HIGH (ref 65–99)
HCT: 27 % — ABNORMAL LOW (ref 39.0–52.0)
Hemoglobin: 9.2 g/dL — ABNORMAL LOW (ref 13.0–17.0)
Potassium: 3.8 mmol/L (ref 3.5–5.1)
Sodium: 142 mmol/L (ref 135–145)

## 2017-10-21 SURGERY — ARTERIOVENOUS (AV) FISTULA CREATION
Anesthesia: Monitor Anesthesia Care | Laterality: Right

## 2017-10-21 MED ORDER — SODIUM CHLORIDE 0.9 % IV SOLN
INTRAVENOUS | Status: DC
  Administered 2017-10-21: 07:00:00 via INTRAVENOUS

## 2017-10-21 MED ORDER — FENTANYL CITRATE (PF) 250 MCG/5ML IJ SOLN
INTRAMUSCULAR | Status: DC | PRN
  Administered 2017-10-21: 50 ug via INTRAVENOUS

## 2017-10-21 MED ORDER — MEPERIDINE HCL 25 MG/ML IJ SOLN: 6.2500 mg | INTRAMUSCULAR | Status: DC | PRN

## 2017-10-21 MED ORDER — DEXTROSE 5 % IV SOLN
1.5000 g | INTRAVENOUS | Status: AC
  Administered 2017-10-21: 1.5 g via INTRAVENOUS

## 2017-10-21 MED ORDER — FENTANYL CITRATE (PF) 250 MCG/5ML IJ SOLN
INTRAMUSCULAR | Status: AC
  Filled 2017-10-21: qty 5

## 2017-10-21 MED ORDER — OXYCODONE-ACETAMINOPHEN 5-325 MG PO TABS: 1.0000 | ORAL_TABLET | Freq: Four times a day (QID) | ORAL | 0 refills | Status: DC | PRN

## 2017-10-21 MED ORDER — FENTANYL CITRATE (PF) 100 MCG/2ML IJ SOLN
25.0000 ug | INTRAMUSCULAR | Status: DC | PRN
  Administered 2017-10-21 (×2): 50 ug via INTRAVENOUS

## 2017-10-21 MED ORDER — ONDANSETRON HCL 4 MG/2ML IJ SOLN
INTRAMUSCULAR | Status: DC | PRN
  Administered 2017-10-21: 4 mg via INTRAVENOUS

## 2017-10-21 MED ORDER — DEXTROSE 5 % IV SOLN
INTRAVENOUS | Status: AC
  Filled 2017-10-21: qty 1.5

## 2017-10-21 MED ORDER — DEXMEDETOMIDINE HCL IN NACL 200 MCG/50ML IV SOLN
INTRAVENOUS | Status: AC
  Filled 2017-10-21: qty 50

## 2017-10-21 MED ORDER — PROPOFOL 10 MG/ML IV BOLUS
INTRAVENOUS | Status: AC
  Filled 2017-10-21: qty 20

## 2017-10-21 MED ORDER — FENTANYL CITRATE (PF) 100 MCG/2ML IJ SOLN
INTRAMUSCULAR | Status: AC
  Administered 2017-10-21: 50 ug via INTRAVENOUS
  Filled 2017-10-21: qty 2

## 2017-10-21 MED ORDER — MIDAZOLAM HCL 2 MG/2ML IJ SOLN
INTRAMUSCULAR | Status: AC
  Filled 2017-10-21: qty 2

## 2017-10-21 MED ORDER — PHENYLEPHRINE HCL 10 MG/ML IJ SOLN
INTRAVENOUS | Status: DC | PRN
  Administered 2017-10-21: 20 ug/min via INTRAVENOUS

## 2017-10-21 MED ORDER — ONDANSETRON HCL 4 MG/2ML IJ SOLN
INTRAMUSCULAR | Status: AC
  Filled 2017-10-21: qty 2

## 2017-10-21 MED ORDER — LIDOCAINE-EPINEPHRINE 0.5 %-1:200000 IJ SOLN
INTRAMUSCULAR | Status: DC | PRN
  Administered 2017-10-21: 10 mL

## 2017-10-21 MED ORDER — OXYCODONE-ACETAMINOPHEN 5-325 MG PO TABS
ORAL_TABLET | ORAL | Status: AC
  Filled 2017-10-21: qty 2

## 2017-10-21 MED ORDER — PROPOFOL 500 MG/50ML IV EMUL
INTRAVENOUS | Status: DC | PRN
  Administered 2017-10-21: 100 ug/kg/min via INTRAVENOUS

## 2017-10-21 MED ORDER — MIDAZOLAM HCL 2 MG/2ML IJ SOLN: 0.5000 mg | Freq: Once | INTRAMUSCULAR | Status: DC | PRN

## 2017-10-21 MED ORDER — LIDOCAINE-EPINEPHRINE 0.5 %-1:200000 IJ SOLN
INTRAMUSCULAR | Status: AC
  Filled 2017-10-21: qty 1

## 2017-10-21 MED ORDER — OXYCODONE-ACETAMINOPHEN 5-325 MG PO TABS
1.0000 | ORAL_TABLET | Freq: Once | ORAL | Status: AC
  Administered 2017-10-21: 2 via ORAL

## 2017-10-21 MED ORDER — SODIUM CHLORIDE 0.9 % IV SOLN
INTRAVENOUS | Status: DC | PRN
  Administered 2017-10-21: 500 mL

## 2017-10-21 MED ORDER — PROPOFOL 10 MG/ML IV BOLUS
INTRAVENOUS | Status: DC | PRN
  Administered 2017-10-21: 40 mg via INTRAVENOUS

## 2017-10-21 MED ORDER — 0.9 % SODIUM CHLORIDE (POUR BTL) OPTIME
TOPICAL | Status: DC | PRN
  Administered 2017-10-21: 1000 mL

## 2017-10-21 MED ORDER — PHENYLEPHRINE 40 MCG/ML (10ML) SYRINGE FOR IV PUSH (FOR BLOOD PRESSURE SUPPORT)
PREFILLED_SYRINGE | INTRAVENOUS | Status: DC | PRN
  Administered 2017-10-21: 80 ug via INTRAVENOUS

## 2017-10-21 MED ORDER — PROMETHAZINE HCL 25 MG/ML IJ SOLN: 6.2500 mg | INTRAMUSCULAR | Status: DC | PRN

## 2017-10-21 MED ORDER — MIDAZOLAM HCL 5 MG/5ML IJ SOLN
INTRAMUSCULAR | Status: DC | PRN
  Administered 2017-10-21: 2 mg via INTRAVENOUS

## 2017-10-21 SURGICAL SUPPLY — 31 items
ARMBAND PINK RESTRICT EXTREMIT (MISCELLANEOUS) ×6 IMPLANT
CANISTER SUCT 3000ML PPV (MISCELLANEOUS) ×3 IMPLANT
CANNULA VESSEL 3MM 2 BLNT TIP (CANNULA) ×3 IMPLANT
CLIP LIGATING EXTRA MED SLVR (CLIP) ×3 IMPLANT
CLIP LIGATING EXTRA SM BLUE (MISCELLANEOUS) ×3 IMPLANT
COVER PROBE W GEL 5X96 (DRAPES) ×3 IMPLANT
DECANTER SPIKE VIAL GLASS SM (MISCELLANEOUS) ×3 IMPLANT
DERMABOND ADVANCED (GAUZE/BANDAGES/DRESSINGS) ×2
DERMABOND ADVANCED .7 DNX12 (GAUZE/BANDAGES/DRESSINGS) ×1 IMPLANT
ELECT REM PT RETURN 9FT ADLT (ELECTROSURGICAL) ×3
ELECTRODE REM PT RTRN 9FT ADLT (ELECTROSURGICAL) ×1 IMPLANT
GLOVE BIOGEL PI IND STRL 6.5 (GLOVE) ×1 IMPLANT
GLOVE BIOGEL PI IND STRL 7.5 (GLOVE) ×1 IMPLANT
GLOVE BIOGEL PI INDICATOR 6.5 (GLOVE) ×2
GLOVE BIOGEL PI INDICATOR 7.5 (GLOVE) ×2
GLOVE ECLIPSE 7.0 STRL STRAW (GLOVE) ×3 IMPLANT
GLOVE SS BIOGEL STRL SZ 7.5 (GLOVE) ×1 IMPLANT
GLOVE SUPERSENSE BIOGEL SZ 7.5 (GLOVE) ×2
GOWN STRL REUS W/ TWL LRG LVL3 (GOWN DISPOSABLE) ×3 IMPLANT
GOWN STRL REUS W/TWL LRG LVL3 (GOWN DISPOSABLE) ×12 IMPLANT
KIT BASIN OR (CUSTOM PROCEDURE TRAY) ×3 IMPLANT
KIT ROOM TURNOVER OR (KITS) ×3 IMPLANT
NS IRRIG 1000ML POUR BTL (IV SOLUTION) ×3 IMPLANT
PACK CV ACCESS (CUSTOM PROCEDURE TRAY) ×3 IMPLANT
PAD ARMBOARD 7.5X6 YLW CONV (MISCELLANEOUS) ×6 IMPLANT
SUT PROLENE 6 0 CC (SUTURE) ×3 IMPLANT
SUT VIC AB 3-0 SH 27 (SUTURE) ×3
SUT VIC AB 3-0 SH 27X BRD (SUTURE) ×1 IMPLANT
TOWEL GREEN STERILE (TOWEL DISPOSABLE) ×3 IMPLANT
UNDERPAD 30X30 (UNDERPADS AND DIAPERS) ×3 IMPLANT
WATER STERILE IRR 1000ML POUR (IV SOLUTION) ×3 IMPLANT

## 2017-10-21 NOTE — Discharge Instructions (Signed)
° °  [facility] ° °Discharge Instructions ° °AV Fistula or Graft Surgery for Dialysis Access ° °Please refer to the following instructions for your post-procedure care. Your surgeon or physician assistant will discuss any changes with you. ° °Activity ° °You may drive the day following your surgery, if you are comfortable and no longer taking prescription pain medication. Resume full activity as the soreness in your incision resolves. ° °Bathing/Showering ° °You may shower after you go home. Keep your incision dry for 48 hours. Do not soak in a bathtub, hot tub, or swim until the incision heals completely. You may not shower if you have a hemodialysis catheter. ° °Incision Care ° °Clean your incision with mild soap and water after 48 hours. Pat the area dry with a clean towel. You do not need a bandage unless otherwise instructed. Do not apply any ointments or creams to your incision. You may have skin glue on your incision. Do not peel it off. It will come off on its own in about one week. Your arm may swell a bit after surgery. To reduce swelling use pillows to elevate your arm so it is above your heart. Your doctor will tell you if you need to lightly wrap your arm with an ACE bandage. ° °Diet ° °Resume your normal diet. There are not special food restrictions following this procedure. In order to heal from your surgery, it is CRITICAL to get adequate nutrition. Your body requires vitamins, minerals, and protein. Vegetables are the best source of vitamins and minerals. Vegetables also provide the perfect balance of protein. Processed food has little nutritional value, so try to avoid this. ° °Medications ° °Resume taking all of your medications. If your incision is causing pain, you may take over-the counter pain relievers such as acetaminophen (Tylenol). If you were prescribed a stronger pain medication, please be aware these medications can cause nausea and constipation. Prevent  nausea by taking the medication with a snack or meal. Avoid constipation by drinking plenty of fluids and eating foods with high amount of fiber, such as fruits, vegetables, and grains. Do not take Tylenol if you are taking prescription pain medications. ° ° ° ° °Follow up °Your surgeon may want to see you in the office following your access surgery. If so, this will be arranged at the time of your surgery. ° °Please call us immediately for any of the following conditions: ° °Increased pain, redness, drainage (pus) from your incision site °Fever of 101 degrees or higher °Severe or worsening pain at your incision site °Hand pain or numbness. ° °Reduce your risk of vascular disease: ° °Stop smoking. If you would like help, call [facility] at 1-800-QUIT-NOW (1-800-784-8669) or [place] at 336-586-4000 ° °Manage your cholesterol °Maintain a desired weight °Control your diabetes °Keep your blood pressure down ° °Dialysis ° °It will take several weeks to several months for your new dialysis access to be ready for use. Your surgeon will determine when it is OK to use it. Your nephrologist will continue to direct your dialysis. You can continue to use your Permcath until your new access is ready for use. ° °If you have any questions, please call the office at [phone]. ° °

## 2017-10-21 NOTE — Transfer of Care (Signed)
Immediate Anesthesia Transfer of Care Note  Patient: Colin [name]  Procedure(s) Performed: ARTERIOVENOUS (AV) FISTULA CREATION RIGHT ARM (Right )  Patient Location: PACU  Anesthesia Type:MAC  Level of Consciousness: drowsy and patient cooperative  Airway & Oxygen Therapy: Patient Spontanous Breathing and Patient connected to face mask oxygen  Post-op Assessment: Report given to RN and Post -op Vital signs reviewed and stable  Post vital signs: Reviewed and stable  Last Vitals:  Vitals:   10/21/17 0550 10/21/17 0902  BP: (!) 139/97   Pulse: 85   Resp: 18   Temp: 37.1 C 36.8 C  SpO2: 98%     Last Pain:  Vitals:   10/21/17 0902  TempSrc: Oral         Complications: No apparent anesthesia complications

## 2017-10-21 NOTE — Op Note (Signed)
    OPERATIVE REPORT  DATE OF SURGERY: 10/21/2017  PATIENT: Colin [name], 52 y.o. male MRN: [id number]  DOB: 13-May-1966  PRE-OPERATIVE DIAGNOSIS: Chronic renal insufficiency  POST-OPERATIVE DIAGNOSIS:  Same  PROCEDURE: Right upper arm brachial cephalic AV fistula creation  SURGEON:  Curt Jews, M.D.  PHYSICIAN ASSISTANT: Gerri Lins PA-C  ANESTHESIA: Local with sedation  EBL: Normal ml  Total I/O In: 100 [I.V.:100] Out: 10 [Blood:10]  BLOOD ADMINISTERED: None  DRAINS: None  SPECIMEN: None  COUNTS CORRECT:  YES  PLAN OF CARE: PACU  PATIENT DISPOSITION:  PACU - hemodynamically stable  PROCEDURE DETAILS: The patient was taken to the operating room placed in supine position where the area of the right arm was prepped and draped in usual sterile fashion.  SonoSite ultrasound was used to visualize cephalic vein which was of good caliber at the antecubital space proximally.  Using local anesthesia incision was made over the the cephalic vein which was mobilized proximally distally.  Tributary branches were ligated.  The brachial artery was exposed to the same incision.  Cephalic vein was ligated distally and was divided and mobilized to the extended longitudinally with Potts scissors.  The vein was cut to the appropriate length and was spatulated and sewn end-to-side to the artery with clamps removed and excellent flow was noted.  The wounds irrigated with saline.  Hemostasis elect cautery.  Wounds were closed with 3-0 Vicryl in the subcutaneous and subcuticular tissue   Colin [name], M.D., [facility] 10/21/2017 1:52 PM

## 2017-10-21 NOTE — Anesthesia Postprocedure Evaluation (Signed)
Anesthesia Post Note  Patient: Colin [name]  Procedure(s) Performed: ARTERIOVENOUS (AV) FISTULA CREATION RIGHT ARM (Right )     Patient location during evaluation: PACU Anesthesia Type: MAC Level of consciousness: awake and alert, patient cooperative and oriented Pain management: pain level controlled Vital Signs Assessment: post-procedure vital signs reviewed and stable Respiratory status: spontaneous breathing, nonlabored ventilation and respiratory function stable Cardiovascular status: blood pressure returned to baseline and stable Postop Assessment: no apparent nausea or vomiting Anesthetic complications: no    Last Vitals:  Vitals:   10/21/17 0945 10/21/17 1015  BP: 125/90 (!) 134/91  Pulse: 75 75  Resp: 17 18  Temp:    SpO2: 96% 100%    Last Pain:  Vitals:   10/21/17 1015  TempSrc:   PainSc: 0-No pain                 Dalma Panchal,E. Lisandra Mathisen

## 2017-10-21 NOTE — Interval H&P Note (Signed)
History and Physical Interval Note:  10/21/2017 7:23 AM  Colin [name]  has presented today for surgery, with the diagnosis of CHRONIC KIDNEY DISEASE STAGE IV  The various methods of treatment have been discussed with the patient and family. After consideration of risks, benefits and other options for treatment, the patient has consented to  Procedure(s): ARTERIOVENOUS (AV) FISTULA CREATION RIGHT ARM (Right) as a surgical intervention .  The patient's history has been reviewed, patient examined, no change in status, stable for surgery.  I have reviewed the patient's chart and labs.  Questions were answered to the patient's satisfaction.     Curt Jews

## 2017-10-22 ENCOUNTER — Encounter (HOSPITAL_COMMUNITY): Payer: Self-pay | Admitting: Vascular Surgery

## 2017-10-24 ENCOUNTER — Telehealth: Payer: Self-pay | Admitting: Vascular Surgery

## 2017-10-24 NOTE — Telephone Encounter (Signed)
Sched appt 11/29/17; lab at 9:00 and MD at 10:15. Spoke to pt.

## 2017-10-24 NOTE — Telephone Encounter (Signed)
-----   Message from Mena Goes, RN sent at 10/21/2017 10:15 AM EST ----- Regarding: 6 weeks   ----- Message ----- From: Ulyses Amor, PA-C Sent: 10/21/2017   8:53 AM To: Vvs Charge Pool  S/P right UE AV fistula creation f/u 6 weeks for duplex of fistula with Dr. Donnetta Hutching

## 2017-10-25 NOTE — Progress Notes (Signed)
This encounter was created in error - please disregard.

## 2017-10-26 ENCOUNTER — Telehealth: Payer: Self-pay | Admitting: *Deleted

## 2017-10-26 NOTE — Telephone Encounter (Signed)
Patient called to C/o swelling and numbness in RUE. Denies any signs of infection, claims good color and temp to extremity. good ROM in fingers of right hand. Instructed to elevate right arm above the level of his heart and wiggle fingers to decrease circulation. Patient states he is at work where is is unable to do that. Encouraged to do the best he cal and call back for any worsening symptoms for check in office.

## 2017-11-02 DIAGNOSIS — I120 Hypertensive chronic kidney disease with stage 5 chronic kidney disease or end stage renal disease: Secondary | ICD-10-CM | POA: Diagnosis not present

## 2017-11-02 DIAGNOSIS — D631 Anemia in chronic kidney disease: Secondary | ICD-10-CM | POA: Diagnosis not present

## 2017-11-02 DIAGNOSIS — N185 Chronic kidney disease, stage 5: Secondary | ICD-10-CM | POA: Diagnosis not present

## 2017-11-02 DIAGNOSIS — N2581 Secondary hyperparathyroidism of renal origin: Secondary | ICD-10-CM | POA: Diagnosis not present

## 2017-11-03 ENCOUNTER — Telehealth: Payer: Self-pay

## 2017-11-03 ENCOUNTER — Other Ambulatory Visit: Payer: Self-pay

## 2017-11-03 ENCOUNTER — Encounter: Payer: Self-pay | Admitting: Family

## 2017-11-03 ENCOUNTER — Ambulatory Visit: Payer: Commercial Managed Care - PPO | Admitting: Family

## 2017-11-03 VITALS — BP 133/83 | HR 56 | Temp 97.9°F | Resp 16 | Ht 65.0 in | Wt 244.0 lb

## 2017-11-03 DIAGNOSIS — T82898A Other specified complication of vascular prosthetic devices, implants and grafts, initial encounter: Secondary | ICD-10-CM

## 2017-11-03 DIAGNOSIS — M79601 Pain in right arm: Secondary | ICD-10-CM

## 2017-11-03 DIAGNOSIS — N2889 Other specified disorders of kidney and ureter: Secondary | ICD-10-CM

## 2017-11-03 DIAGNOSIS — N184 Chronic kidney disease, stage 4 (severe): Secondary | ICD-10-CM

## 2017-11-03 MED ORDER — TRAMADOL HCL 50 MG PO TABS: 50.0000 mg | ORAL_TABLET | Freq: Two times a day (BID) | ORAL | 0 refills | Status: DC | PRN

## 2017-11-03 NOTE — Progress Notes (Signed)
Postoperative Access Visit   History of Present Illness  Colin [name] is a 52 y.o. year old male who is s/p right upper arm brachial cephalic AV fistula creation on 10-21-17 by Dr. Donnetta Hutching for chronic renal insufficiency. He had a previous right lower arm AV fistula that occluded.   He returns today with c/o worsening intermittent pain in right wrist, right arm, and up to right shoulder with using right hand, swelling is also worsening in his right hand and forearm; he denies doing heavy lifting. He states his right fingers are slightly more numb than left fingers.   He has not yet started hemodialysis, his nephrologist has not indicated that he needs to start HD imminently.   He has a 6 weeks follow up appointment on 11-29-17 for access duplex and see Dr. Donnetta Hutching.  He works at [employer], on a Teaching laboratory technician. He is left hand dominant.   The patient's right antecubital incision has healed. The patient is able to complete his activities of daily living.    PRE-ADM LIVING: Home  AMB STATUS: Ambulatory   Past Medical History:  Diagnosis Date  . Anemia    iron deficiency  . Arthritis    Gout  . CKD (chronic kidney disease) stage 3, GFR 30-59 ml/min (HCC)    Stage 4  . Gout   . History of chicken pox   . Hypertension   . Melanosis coli   . Mumps   . Pneumonia   . Ulcerative colitis [place], [place]    Past Surgical History:  Procedure Laterality Date  . AV FISTULA PLACEMENT Right 08/27/2016   Procedure: RIGHT RADIOCEPHALIC ARTERIOVENOUS FISTULA CREATION;  Surgeon: [name], MD;  Location: [facility] OR;  Service: Vascular;  Laterality: Right;  . AV FISTULA PLACEMENT Right 10/21/2017   Procedure: ARTERIOVENOUS (AV) FISTULA CREATION RIGHT ARM;  Surgeon: [name], MD;  Location: [place];  Service: Vascular;  Laterality: Right;  . COLONOSCOPY    . FISTULA SUPERFICIALIZATION Right 11/12/2016   Procedure: FISTULA SUPERFICIALIZATION RIGHT FOREEARM;  Surgeon: [name], MD;  Location:  [facility] OR;  Service: Vascular;  Laterality: Right;  . TENDON REPAIR  1987   Left index finger    Social History   Socioeconomic History  . Marital status: Married    Spouse name: Not on file  . Number of children: Not on file  . Years of education: Not on file  . Highest education level: Not on file  Social Needs  . Financial resource strain: Not on file  . Food insecurity - worry: Not on file  . Food insecurity - inability: Not on file  . Transportation needs - medical: Not on file  . Transportation needs - non-medical: Not on file  Occupational History  . Not on file  Tobacco Use  . Smoking status: Never Smoker  . Smokeless tobacco: Former Systems developer    Types: Chew  Substance and Sexual Activity  . Alcohol use: Yes    Comment: rare  . Drug use: No  . Sexual activity: Not on file  Other Topics Concern  . Not on file  Social History Narrative  . Not on file    Allergies  Allergen Reactions  . No Known Allergies     Current Outpatient Medications on File Prior to Visit  Medication Sig Dispense Refill  . allopurinol (ZYLOPRIM) 100 MG tablet Take 100 mg by mouth daily.    Marland Kitchen amLODipine (NORVASC) 5 MG tablet  Take 5 mg by mouth at bedtime.    Marland Kitchen atenolol (TENORMIN) 50 MG tablet Take 100 mg by mouth at bedtime.  5  . calcitRIOL (ROCALTROL) 0.5 MCG capsule Take 0.5 mcg by mouth at bedtime.    . ferrous sulfate 325 (65 FE) MG tablet Take 325 mg by mouth at bedtime.    . furosemide (LASIX) 80 MG tablet Take 120 mg by mouth 2 (two) times daily.     Marland Kitchen gentamicin cream (GARAMYCIN) 0.1 % Apply 1 application topically 3 (three) times daily. 30 g 1  . HUMIRA PEN-CD/UC/HS STARTER 80 MG/0.8ML PNKT Take 80 mg by mouth every 14 (fourteen) days.    . hydrALAZINE (APRESOLINE) 50 MG tablet Take 1 tablet (50 mg total) by mouth at bedtime. (Patient taking differently: Take 75 mg by mouth 3 (three) times daily. )    . potassium chloride SA (K-DUR,KLOR-CON) 20 MEQ tablet Take 20 mEq by mouth daily.      . pravastatin (PRAVACHOL) 40 MG tablet Take 40 mg by mouth at bedtime.     . sevelamer carbonate (RENVELA) 800 MG tablet Take 1,600 mg by mouth 2 (two) times daily.    Marland Kitchen oxyCODONE-acetaminophen (PERCOCET/ROXICET) 5-325 MG tablet Take 1 tablet by mouth every 6 (six) hours as needed. (Patient not taking: Reported on 11/03/2017) 6 tablet 0   No current facility-administered medications on file prior to visit.      Physical Examination Vitals:   11/03/17 1514  BP: 133/83  Pulse: (!) 56  Resp: 16  Temp: 97.9 F (36.6 C)  TempSrc: Oral  SpO2: 97%  Weight: 244 lb (110.7 kg)  Height: 5' 5"  (1.651 m)   Body mass index is 40.6 kg/m.  Right hand has mild to moderate swelling compared to the left hand. Right ringers are less sensitive to touch than right fingers, right fingers are warm and normal to touch. Right hand grip is 4/5.   Right antecubital incision is well healed. Right radial pulse is 1+ palpable,  +palpable thrill at right upper arm AV fistual, bruit can be auscultated.   Medical Decision Making  Colin [name] is a 52 y.o. year old male who presents s/p right upper arm brachial cephalic AV fistula creation on 10-21-17.  Pt c/o worse intermittent pain in right wrist, right shoulder, and several areas of right arm; also c/o worsening swelling in right hand and forearm.   Dr. Oneida Alar spoke with and examined pt. Advised pt to elevate right UE as often as possible and exercise his right hand, try not to lay on his right side.  Tramadol 50 mg po every 12 hours prn pain, disp #20, 0 refills. I advised him not to take any more ibuprofen (was taking for pain).  Follow up on 11-29-17 with duplex and Dr. Donnetta Hutching as scheduled.    Thank you for allowing [place] to participate in this patient's care.  Clemon Chambers, RN, MSN, FNP-C [facility] [place] Office: [phone]  11/03/2017, 3:27 PM  Clinic MD: Oneida Alar

## 2017-11-03 NOTE — Telephone Encounter (Signed)
Returned patient call. He is having pain in wrist and also having difficulty holding on to things with his right hand. Patient is coming in today to be seen by NP at 3:15pm.

## 2017-11-29 ENCOUNTER — Ambulatory Visit: Payer: Commercial Managed Care - PPO | Admitting: Vascular Surgery

## 2017-11-29 ENCOUNTER — Ambulatory Visit (HOSPITAL_COMMUNITY)
Admission: RE | Admit: 2017-11-29 | Discharge: 2017-11-29 | Disposition: A | Discharge status: Home / Self Care | Payer: Commercial Managed Care - PPO | Source: Ambulatory Visit | Attending: Vascular Surgery | Admitting: Vascular Surgery

## 2017-11-29 ENCOUNTER — Other Ambulatory Visit: Payer: Self-pay

## 2017-11-29 ENCOUNTER — Encounter: Payer: Self-pay | Admitting: Vascular Surgery

## 2017-11-29 VITALS — BP 148/98 | HR 62 | Temp 97.6°F | Resp 16 | Ht 65.0 in | Wt 240.0 lb

## 2017-11-29 DIAGNOSIS — N183 Chronic kidney disease, stage 3 unspecified: Secondary | ICD-10-CM

## 2017-11-29 DIAGNOSIS — T82868A Thrombosis of vascular prosthetic devices, implants and grafts, initial encounter: Secondary | ICD-10-CM | POA: Diagnosis not present

## 2017-11-29 DIAGNOSIS — Y832 Surgical operation with anastomosis, bypass or graft as the cause of abnormal reaction of the patient, or of later complication, without mention of misadventure at the time of the procedure: Secondary | ICD-10-CM | POA: Insufficient documentation

## 2017-11-29 DIAGNOSIS — Z48812 Encounter for surgical aftercare following surgery on the circulatory system: Secondary | ICD-10-CM | POA: Diagnosis not present

## 2017-11-29 DIAGNOSIS — N186 End stage renal disease: Secondary | ICD-10-CM | POA: Diagnosis not present

## 2017-11-29 DIAGNOSIS — N184 Chronic kidney disease, stage 4 (severe): Secondary | ICD-10-CM

## 2017-11-29 NOTE — H&P (View-Only) (Signed)
Patient name: Colin [name] MRN: [id number] DOB: April 21, 1966 Sex: male  REASON FOR VISIT: Follow-up right upper arm AV fistula creation on 10/21/2017  HPI: Colin [name] is a 52 y.o. male here for follow-up of AV fistula creation.  He is not yet on hemodialysis.  He had initially had a right radiocephalic fistula approximately 1 year ago.  He is left-handed.  He had nice dilatation to 6 mm of his fistula but this ran deep and had superficial mobilization.  Initial follow-up showed that his fistula was working very nicely and it looked as though he would have a very high likelihood that this would be successful for dialysis.  He subsequently spontaneously thrombosed the fistula.  He underwent right upper arm fistula creation brachiocephalic by myself on 01/16/6605 and here today for follow-up  Current Outpatient Medications  Medication Sig Dispense Refill  . allopurinol (ZYLOPRIM) 100 MG tablet Take 100 mg by mouth daily.    Marland Kitchen amLODipine (NORVASC) 5 MG tablet Take 5 mg by mouth at bedtime.    Marland Kitchen atenolol (TENORMIN) 50 MG tablet Take 100 mg by mouth at bedtime.  5  . calcitRIOL (ROCALTROL) 0.5 MCG capsule Take 0.5 mcg by mouth at bedtime.    . ferrous sulfate 325 (65 FE) MG tablet Take 325 mg by mouth at bedtime.    . furosemide (LASIX) 80 MG tablet Take 120 mg by mouth 2 (two) times daily.     Marland Kitchen gentamicin cream (GARAMYCIN) 0.1 % Apply 1 application topically 3 (three) times daily. 30 g 1  . HUMIRA PEN 40 MG/0.4ML PNKT     . hydrALAZINE (APRESOLINE) 50 MG tablet Take 1 tablet (50 mg total) by mouth at bedtime. (Patient taking differently: Take 75 mg by mouth 3 (three) times daily. )    . potassium chloride SA (K-DUR,KLOR-CON) 20 MEQ tablet Take 20 mEq by mouth daily.    . pravastatin (PRAVACHOL) 40 MG tablet Take 40 mg by mouth at bedtime.     . sevelamer carbonate (RENVELA) 800 MG tablet Take 1,600 mg by mouth 2 (two) times daily.    . calcitRIOL (ROCALTROL) 0.25 MCG  capsule Take by mouth daily.  6  . HUMIRA PEN-CD/UC/HS STARTER 80 MG/0.8ML PNKT Take 80 mg by mouth every 14 (fourteen) days.    Marland Kitchen oxyCODONE-acetaminophen (PERCOCET/ROXICET) 5-325 MG tablet Take 1 tablet by mouth every 6 (six) hours as needed. (Patient not taking: Reported on 11/03/2017) 6 tablet 0  . predniSONE (DELTASONE) 10 MG tablet TAKE 4 TABLETS BY MOUTH ONCE DAILY  5  . traMADol (ULTRAM) 50 MG tablet Take 1 tablet (50 mg total) by mouth every 12 (twelve) hours as needed. (Patient not taking: Reported on 11/29/2017) 20 tablet 0   No current facility-administered medications for this visit.      PHYSICAL EXAM: Vitals:   11/29/17 0926 11/29/17 0933  BP: (!) 150/97 (!) 148/98  Pulse: (!) 57 62  Resp: 16   Temp: 97.6 F (36.4 C)   TempSrc: Oral   SpO2: 98%   Weight: 240 lb (108.9 kg)   Height: 5' 5"  (1.651 m)     GENERAL: The patient is a well-nourished male, in no acute distress. The vital signs are documented above. Fistula.  This does run somewhat deep to the surface.  On duplex that shows that the maximal diameter is between 6-1/2-10 mm throughout its course.  There is some thrombus noted in the vein which is nonocclusive but is flow-limiting.  MEDICAL ISSUES: I discussed this  at length with the patient.  I am unclear as to the reason why he is thrombosing nicely developed fistulas.  I explained the option would be observation only which I feel would have an extremely high to complete thrombosis of his fistula.  The other option would be to surgically revise this by removing the thrombus and also mobilizing the vein to the surface during the same setting.  I did explain that it is very unusual on both a fistula once it is matured to a good size and especially if it is not being used for access.  He may have some hypercoagulable state which is making him more prone to thrombus.  Despite this I would recommend attempted revision versus abandoning his right upper arm fistula.  He  understands and we will schedule this at his convenience as an outpatient   [name], MD [facility] Tel [phone] Pager [phone]

## 2017-11-29 NOTE — Progress Notes (Signed)
Patient name: Colin [name] MRN: [id number] DOB: 11/13/1965 Sex: male  REASON FOR VISIT: Follow-up right upper arm AV fistula creation on 10/21/2017  HPI: Colin [name] is a 52 y.o. male here for follow-up of AV fistula creation.  He is not yet on hemodialysis.  He had initially had a right radiocephalic fistula approximately 1 year ago.  He is left-handed.  He had nice dilatation to 6 mm of his fistula but this ran deep and had superficial mobilization.  Initial follow-up showed that his fistula was working very nicely and it looked as though he would have a very high likelihood that this would be successful for dialysis.  He subsequently spontaneously thrombosed the fistula.  He underwent right upper arm fistula creation brachiocephalic by myself on 04/19/7105 and here today for follow-up  Current Outpatient Medications  Medication Sig Dispense Refill  . allopurinol (ZYLOPRIM) 100 MG tablet Take 100 mg by mouth daily.    Marland Kitchen amLODipine (NORVASC) 5 MG tablet Take 5 mg by mouth at bedtime.    Marland Kitchen atenolol (TENORMIN) 50 MG tablet Take 100 mg by mouth at bedtime.  5  . calcitRIOL (ROCALTROL) 0.5 MCG capsule Take 0.5 mcg by mouth at bedtime.    . ferrous sulfate 325 (65 FE) MG tablet Take 325 mg by mouth at bedtime.    . furosemide (LASIX) 80 MG tablet Take 120 mg by mouth 2 (two) times daily.     Marland Kitchen gentamicin cream (GARAMYCIN) 0.1 % Apply 1 application topically 3 (three) times daily. 30 g 1  . HUMIRA PEN 40 MG/0.4ML PNKT     . hydrALAZINE (APRESOLINE) 50 MG tablet Take 1 tablet (50 mg total) by mouth at bedtime. (Patient taking differently: Take 75 mg by mouth 3 (three) times daily. )    . potassium chloride SA (K-DUR,KLOR-CON) 20 MEQ tablet Take 20 mEq by mouth daily.    . pravastatin (PRAVACHOL) 40 MG tablet Take 40 mg by mouth at bedtime.     . sevelamer carbonate (RENVELA) 800 MG tablet Take 1,600 mg by mouth 2 (two) times daily.    . calcitRIOL (ROCALTROL) 0.25 MCG  capsule Take by mouth daily.  6  . HUMIRA PEN-CD/UC/HS STARTER 80 MG/0.8ML PNKT Take 80 mg by mouth every 14 (fourteen) days.    Marland Kitchen oxyCODONE-acetaminophen (PERCOCET/ROXICET) 5-325 MG tablet Take 1 tablet by mouth every 6 (six) hours as needed. (Patient not taking: Reported on 11/03/2017) 6 tablet 0  . predniSONE (DELTASONE) 10 MG tablet TAKE 4 TABLETS BY MOUTH ONCE DAILY  5  . traMADol (ULTRAM) 50 MG tablet Take 1 tablet (50 mg total) by mouth every 12 (twelve) hours as needed. (Patient not taking: Reported on 11/29/2017) 20 tablet 0   No current facility-administered medications for this visit.      PHYSICAL EXAM: Vitals:   11/29/17 0926 11/29/17 0933  BP: (!) 150/97 (!) 148/98  Pulse: (!) 57 62  Resp: 16   Temp: 97.6 F (36.4 C)   TempSrc: Oral   SpO2: 98%   Weight: 240 lb (108.9 kg)   Height: 5' 5"  (1.651 m)     GENERAL: The patient is a well-nourished male, in no acute distress. The vital signs are documented above. Fistula.  This does run somewhat deep to the surface.  On duplex that shows that the maximal diameter is between 6-1/2-10 mm throughout its course.  There is some thrombus noted in the vein which is nonocclusive but is flow-limiting.  MEDICAL ISSUES: I discussed this  at length with the patient.  I am unclear as to the reason why he is thrombosing nicely developed fistulas.  I explained the option would be observation only which I feel would have an extremely high to complete thrombosis of his fistula.  The other option would be to surgically revise this by removing the thrombus and also mobilizing the vein to the surface during the same setting.  I did explain that it is very unusual on both a fistula once it is matured to a good size and especially if it is not being used for access.  He may have some hypercoagulable state which is making him more prone to thrombus.  Despite this I would recommend attempted revision versus abandoning his right upper arm fistula.  He  understands and we will schedule this at his convenience as an outpatient   [name], MD [facility] Tel [phone] Pager [phone]

## 2017-11-29 NOTE — Progress Notes (Signed)
Vitals:   11/29/17 0926  BP: (!) 150/97  Pulse: (!) 57  Resp: 16  Temp: 97.6 F (36.4 C)  TempSrc: Oral  SpO2: 98%  Weight: 240 lb (108.9 kg)  Height: 5' 5"  (1.651 m)

## 2017-12-05 ENCOUNTER — Other Ambulatory Visit: Payer: Self-pay | Admitting: *Deleted

## 2017-12-05 DIAGNOSIS — E7800 Pure hypercholesterolemia, unspecified: Secondary | ICD-10-CM | POA: Diagnosis not present

## 2017-12-05 DIAGNOSIS — I129 Hypertensive chronic kidney disease with stage 1 through stage 4 chronic kidney disease, or unspecified chronic kidney disease: Secondary | ICD-10-CM | POA: Diagnosis not present

## 2017-12-05 DIAGNOSIS — R7309 Other abnormal glucose: Secondary | ICD-10-CM | POA: Diagnosis not present

## 2017-12-05 DIAGNOSIS — N185 Chronic kidney disease, stage 5: Secondary | ICD-10-CM | POA: Diagnosis not present

## 2017-12-05 NOTE — Progress Notes (Signed)
Patient instructed to be at [facility] hospital at 5:30 am on 12/23/17 for surgery.  NPO past MN night prior and will need driver home. Expect call and follow the instructions from the [place].

## 2017-12-14 DIAGNOSIS — K5190 Ulcerative colitis, unspecified, without complications: Secondary | ICD-10-CM | POA: Diagnosis not present

## 2017-12-21 ENCOUNTER — Encounter (HOSPITAL_COMMUNITY): Payer: Self-pay | Admitting: *Deleted

## 2017-12-21 ENCOUNTER — Other Ambulatory Visit: Payer: Self-pay

## 2017-12-21 DIAGNOSIS — N2581 Secondary hyperparathyroidism of renal origin: Secondary | ICD-10-CM | POA: Diagnosis not present

## 2017-12-21 DIAGNOSIS — I120 Hypertensive chronic kidney disease with stage 5 chronic kidney disease or end stage renal disease: Secondary | ICD-10-CM | POA: Diagnosis not present

## 2017-12-21 DIAGNOSIS — N189 Chronic kidney disease, unspecified: Secondary | ICD-10-CM | POA: Diagnosis not present

## 2017-12-21 DIAGNOSIS — D631 Anemia in chronic kidney disease: Secondary | ICD-10-CM | POA: Diagnosis not present

## 2017-12-21 DIAGNOSIS — N185 Chronic kidney disease, stage 5: Secondary | ICD-10-CM | POA: Diagnosis not present

## 2017-12-21 NOTE — Progress Notes (Signed)
   12/21/17 1807  OBSTRUCTIVE SLEEP APNEA  Have you ever been diagnosed with sleep apnea through a sleep study? No  Do you snore loudly (loud enough to be heard through closed doors)?  1  Do you often feel tired, fatigued, or sleepy during the daytime (such as falling asleep during driving or talking to someone)? 0  Has anyone observed you stop breathing during your sleep? 0  Do you have, or are you being treated for high blood pressure? 1  BMI more than 35 kg/m2? 1  Age > 82 (1-yes) 1  Male Gender (Yes=1) 1  Obstructive Sleep Apnea Score 5

## 2017-12-21 NOTE — Progress Notes (Signed)
Spoke with pt for pre-op call. Pt denies cardiac history, chest pain or diabetes.

## 2017-12-22 NOTE — Anesthesia Preprocedure Evaluation (Addendum)
Anesthesia Evaluation  Patient identified by MRN, date of birth, ID band Patient awake    Reviewed: Allergy & Precautions, NPO status , Patient's Chart, lab work & pertinent test results, reviewed documented beta blocker date and time   History of Anesthesia Complications Negative for: history of anesthetic complications  Airway Mallampati: II  TM Distance: >3 FB Neck ROM: Full    Dental  (+) Missing, Dental Advisory Given   Pulmonary neg pulmonary ROS,    breath sounds clear to auscultation       Cardiovascular hypertension, Pt. on medications and Pt. on home beta blockers (-) angina Rhythm:Regular Rate:Normal  '15 ECHO: EF 55-60%, valves OK   Neuro/Psych negative neurological ROS     GI/Hepatic Neg liver ROS, Ulcerative colitis   Endo/Other  Morbid obesity  Renal/GU ESRFRenal disease (no dialysis yet, K+ 4.7)     Musculoskeletal   Abdominal (+) + obese,   Peds  Hematology  (+) Blood dyscrasia (Hb 10.9), anemia ,   Anesthesia Other Findings   Reproductive/Obstetrics                            Anesthesia Physical Anesthesia Plan  ASA: III  Anesthesia Plan: MAC   Post-op Pain Management:    Induction:   PONV Risk Score and Plan: 1 and Treatment may vary due to age or medical condition  Airway Management Planned: Natural Airway and Simple Face Mask  Additional Equipment:   Intra-op Plan:   Post-operative Plan:   Informed Consent: I have reviewed the patients History and Physical, chart, labs and discussed the procedure including the risks, benefits and alternatives for the proposed anesthesia with the patient or authorized representative who has indicated his/her understanding and acceptance.   Dental advisory given  Plan Discussed with: CRNA and Surgeon  Anesthesia Plan Comments: (Plan routine monitors, MAC)        Anesthesia Quick Evaluation

## 2017-12-23 ENCOUNTER — Encounter (HOSPITAL_COMMUNITY)
Admission: RE | Disposition: A | Discharge status: Home / Self Care | Payer: Self-pay | Source: Ambulatory Visit | Attending: Vascular Surgery

## 2017-12-23 ENCOUNTER — Ambulatory Visit (HOSPITAL_COMMUNITY): Payer: Commercial Managed Care - PPO | Admitting: Certified Registered Nurse Anesthetist

## 2017-12-23 ENCOUNTER — Ambulatory Visit (HOSPITAL_COMMUNITY)
Admission: RE | Admit: 2017-12-23 | Discharge: 2017-12-23 | Disposition: A | Discharge status: Home / Self Care | Payer: Commercial Managed Care - PPO | Source: Ambulatory Visit | Attending: Vascular Surgery | Admitting: Vascular Surgery

## 2017-12-23 ENCOUNTER — Other Ambulatory Visit: Payer: Self-pay

## 2017-12-23 DIAGNOSIS — Z992 Dependence on renal dialysis: Secondary | ICD-10-CM | POA: Insufficient documentation

## 2017-12-23 DIAGNOSIS — T82898A Other specified complication of vascular prosthetic devices, implants and grafts, initial encounter: Secondary | ICD-10-CM | POA: Diagnosis not present

## 2017-12-23 DIAGNOSIS — K5190 Ulcerative colitis, unspecified, without complications: Secondary | ICD-10-CM | POA: Insufficient documentation

## 2017-12-23 DIAGNOSIS — N186 End stage renal disease: Secondary | ICD-10-CM | POA: Diagnosis not present

## 2017-12-23 DIAGNOSIS — Z79899 Other long term (current) drug therapy: Secondary | ICD-10-CM | POA: Diagnosis not present

## 2017-12-23 DIAGNOSIS — I120 Hypertensive chronic kidney disease with stage 5 chronic kidney disease or end stage renal disease: Secondary | ICD-10-CM | POA: Insufficient documentation

## 2017-12-23 DIAGNOSIS — E785 Hyperlipidemia, unspecified: Secondary | ICD-10-CM | POA: Diagnosis not present

## 2017-12-23 HISTORY — PX: REVISON OF ARTERIOVENOUS FISTULA: SHX6074

## 2017-12-23 LAB — POCT I-STAT 4, (NA,K, GLUC, HGB,HCT)
Glucose, Bld: 99 mg/dL (ref 65–99)
HCT: 32 % — ABNORMAL LOW (ref 39.0–52.0)
HEMOGLOBIN: 10.9 g/dL — AB (ref 13.0–17.0)
Potassium: 4.7 mmol/L (ref 3.5–5.1)
SODIUM: 142 mmol/L (ref 135–145)

## 2017-12-23 SURGERY — REVISON OF ARTERIOVENOUS FISTULA
Anesthesia: Monitor Anesthesia Care | Site: Arm Upper | Laterality: Right

## 2017-12-23 MED ORDER — SODIUM CHLORIDE 0.9 % IV SOLN
INTRAVENOUS | Status: DC
  Administered 2017-12-23: 07:00:00 via INTRAVENOUS

## 2017-12-23 MED ORDER — PROMETHAZINE HCL 12.5 MG PO TABS
12.5000 mg | ORAL_TABLET | Freq: Once | ORAL | Status: AC
  Administered 2017-12-23: 12.5 mg via ORAL
  Filled 2017-12-23 (×2): qty 1

## 2017-12-23 MED ORDER — CEFAZOLIN SODIUM-DEXTROSE 2-3 GM-%(50ML) IV SOLR
INTRAVENOUS | Status: DC | PRN
  Administered 2017-12-23: 2 g via INTRAVENOUS

## 2017-12-23 MED ORDER — MEPERIDINE HCL 50 MG/ML IJ SOLN: 6.2500 mg | INTRAMUSCULAR | Status: DC | PRN

## 2017-12-23 MED ORDER — LIDOCAINE-EPINEPHRINE 0.5 %-1:200000 IJ SOLN
INTRAMUSCULAR | Status: AC
  Filled 2017-12-23: qty 1

## 2017-12-23 MED ORDER — MIDAZOLAM HCL 5 MG/5ML IJ SOLN
INTRAMUSCULAR | Status: DC | PRN
  Administered 2017-12-23: 1 mg via INTRAVENOUS

## 2017-12-23 MED ORDER — CHLORHEXIDINE GLUCONATE 4 % EX LIQD: 60.0000 mL | Freq: Once | CUTANEOUS | Status: DC

## 2017-12-23 MED ORDER — PROMETHAZINE HCL 25 MG/ML IJ SOLN
INTRAMUSCULAR | Status: AC
  Filled 2017-12-23: qty 1

## 2017-12-23 MED ORDER — FENTANYL CITRATE (PF) 100 MCG/2ML IJ SOLN
INTRAMUSCULAR | Status: DC | PRN
  Administered 2017-12-23: 25 ug via INTRAVENOUS
  Administered 2017-12-23: 50 ug via INTRAVENOUS
  Administered 2017-12-23: 25 ug via INTRAVENOUS
  Administered 2017-12-23: 50 ug via INTRAVENOUS

## 2017-12-23 MED ORDER — LIDOCAINE-EPINEPHRINE 0.5 %-1:200000 IJ SOLN
INTRAMUSCULAR | Status: DC | PRN
  Administered 2017-12-23: 50 mL

## 2017-12-23 MED ORDER — SODIUM CHLORIDE 0.9 % IR SOLN
Status: DC | PRN
  Administered 2017-12-23: 1

## 2017-12-23 MED ORDER — MIDAZOLAM HCL 2 MG/2ML IJ SOLN
INTRAMUSCULAR | Status: AC
  Filled 2017-12-23: qty 2

## 2017-12-23 MED ORDER — HYDROCODONE-ACETAMINOPHEN 5-325 MG PO TABS: 1.0000 | ORAL_TABLET | Freq: Four times a day (QID) | ORAL | 0 refills | Status: DC | PRN

## 2017-12-23 MED ORDER — SODIUM CHLORIDE 0.9 % IV SOLN
INTRAVENOUS | Status: DC | PRN
  Administered 2017-12-23: 07:00:00

## 2017-12-23 MED ORDER — PROMETHAZINE HCL 25 MG/ML IJ SOLN: 6.2500 mg | INTRAMUSCULAR | Status: DC | PRN

## 2017-12-23 MED ORDER — SODIUM CHLORIDE 0.9 % IV SOLN: 1.5000 g | INTRAVENOUS | Status: DC

## 2017-12-23 MED ORDER — PHENYLEPHRINE 40 MCG/ML (10ML) SYRINGE FOR IV PUSH (FOR BLOOD PRESSURE SUPPORT)
PREFILLED_SYRINGE | INTRAVENOUS | Status: AC
  Filled 2017-12-23: qty 10

## 2017-12-23 MED ORDER — PROPOFOL 500 MG/50ML IV EMUL
INTRAVENOUS | Status: DC | PRN
  Administered 2017-12-23: 50 ug/kg/min via INTRAVENOUS

## 2017-12-23 MED ORDER — LIDOCAINE HCL (CARDIAC) 20 MG/ML IV SOLN
INTRAVENOUS | Status: DC | PRN
  Administered 2017-12-23: 40 mg via INTRAVENOUS

## 2017-12-23 MED ORDER — PROPOFOL 10 MG/ML IV BOLUS
INTRAVENOUS | Status: AC
  Filled 2017-12-23: qty 20

## 2017-12-23 MED ORDER — FENTANYL CITRATE (PF) 250 MCG/5ML IJ SOLN
INTRAMUSCULAR | Status: AC
  Filled 2017-12-23: qty 5

## 2017-12-23 MED ORDER — FENTANYL CITRATE (PF) 100 MCG/2ML IJ SOLN
25.0000 ug | INTRAMUSCULAR | Status: DC | PRN
  Administered 2017-12-23 (×2): 50 ug via INTRAVENOUS
  Administered 2017-12-23 (×2): 25 ug via INTRAVENOUS

## 2017-12-23 MED ORDER — ONDANSETRON HCL 4 MG/2ML IJ SOLN
INTRAMUSCULAR | Status: DC | PRN
  Administered 2017-12-23: 4 mg via INTRAVENOUS

## 2017-12-23 MED ORDER — PROPOFOL 10 MG/ML IV BOLUS: INTRAVENOUS | Status: DC | PRN

## 2017-12-23 MED ORDER — MIDAZOLAM HCL 2 MG/2ML IJ SOLN: 0.5000 mg | Freq: Once | INTRAMUSCULAR | Status: DC | PRN

## 2017-12-23 MED ORDER — FENTANYL CITRATE (PF) 100 MCG/2ML IJ SOLN
INTRAMUSCULAR | Status: AC
  Filled 2017-12-23: qty 2

## 2017-12-23 MED ORDER — FENTANYL CITRATE (PF) 100 MCG/2ML IJ SOLN
INTRAMUSCULAR | Status: DC
Start: 2017-12-23 — End: 2017-12-23
  Filled 2017-12-23: qty 2

## 2017-12-23 MED ORDER — CEFAZOLIN SODIUM-DEXTROSE 2-4 GM/100ML-% IV SOLN
INTRAVENOUS | Status: AC
  Filled 2017-12-23: qty 100

## 2017-12-23 SURGICAL SUPPLY — 34 items
ADH SKN CLS APL DERMABOND .7 (GAUZE/BANDAGES/DRESSINGS) ×1
ARMBAND PINK RESTRICT EXTREMIT (MISCELLANEOUS) ×3 IMPLANT
CANISTER SUCT 3000ML PPV (MISCELLANEOUS) ×3 IMPLANT
CANNULA VESSEL 3MM 2 BLNT TIP (CANNULA) ×3 IMPLANT
CLIP LIGATING EXTRA MED SLVR (CLIP) ×3 IMPLANT
CLIP LIGATING EXTRA SM BLUE (MISCELLANEOUS) ×3 IMPLANT
COVER PROBE W GEL 5X96 (DRAPES) ×3 IMPLANT
DECANTER SPIKE VIAL GLASS SM (MISCELLANEOUS) ×3 IMPLANT
DERMABOND ADVANCED (GAUZE/BANDAGES/DRESSINGS) ×2
DERMABOND ADVANCED .7 DNX12 (GAUZE/BANDAGES/DRESSINGS) ×1 IMPLANT
ELECT REM PT RETURN 9FT ADLT (ELECTROSURGICAL) ×3
ELECTRODE REM PT RTRN 9FT ADLT (ELECTROSURGICAL) ×1 IMPLANT
GLOVE BIO SURGEON STRL SZ7 (GLOVE) ×6 IMPLANT
GLOVE BIO SURGEON STRL SZ7.5 (GLOVE) ×3 IMPLANT
GLOVE BIOGEL PI IND STRL 7.0 (GLOVE) ×1 IMPLANT
GLOVE BIOGEL PI INDICATOR 7.0 (GLOVE) ×2
GLOVE SS BIOGEL STRL SZ 7.5 (GLOVE) ×1 IMPLANT
GLOVE SUPERSENSE BIOGEL SZ 7.5 (GLOVE) ×2
GOWN STRL REUS W/ TWL LRG LVL3 (GOWN DISPOSABLE) ×3 IMPLANT
GOWN STRL REUS W/ TWL XL LVL3 (GOWN DISPOSABLE) ×1 IMPLANT
GOWN STRL REUS W/TWL LRG LVL3 (GOWN DISPOSABLE) ×9
GOWN STRL REUS W/TWL XL LVL3 (GOWN DISPOSABLE) ×3
KIT BASIN OR (CUSTOM PROCEDURE TRAY) ×3 IMPLANT
KIT ROOM TURNOVER OR (KITS) ×3 IMPLANT
NS IRRIG 1000ML POUR BTL (IV SOLUTION) ×3 IMPLANT
PACK CV ACCESS (CUSTOM PROCEDURE TRAY) ×3 IMPLANT
PAD ARMBOARD 7.5X6 YLW CONV (MISCELLANEOUS) ×6 IMPLANT
SUT PROLENE 6 0 CC (SUTURE) ×3 IMPLANT
SUT SILK 2 0 SH (SUTURE) ×3 IMPLANT
SUT VIC AB 3-0 SH 27 (SUTURE) ×6
SUT VIC AB 3-0 SH 27X BRD (SUTURE) ×2 IMPLANT
TOWEL GREEN STERILE (TOWEL DISPOSABLE) ×3 IMPLANT
UNDERPAD 30X30 (UNDERPADS AND DIAPERS) ×3 IMPLANT
WATER STERILE IRR 1000ML POUR (IV SOLUTION) ×3 IMPLANT

## 2017-12-23 NOTE — Anesthesia Postprocedure Evaluation (Signed)
Anesthesia Post Note  Patient: Colin [name]  Procedure(s) Performed: CEPHALIC VEIN TRANSPOSITION ARTERIOVENOUS FISTULA RIGHT UPPER ARM (Right Arm Upper)     Patient location during evaluation: PACU Anesthesia Type: MAC Level of consciousness: awake and alert, patient cooperative and oriented Pain management: pain level controlled Vital Signs Assessment: post-procedure vital signs reviewed and stable Respiratory status: nonlabored ventilation, spontaneous breathing and respiratory function stable Cardiovascular status: blood pressure returned to baseline and stable Postop Assessment: patient able to bend at knees Anesthetic complications: no    Last Vitals:  Vitals:   12/23/17 1032 12/23/17 1045  BP: 118/70   Pulse: 63 60  Resp: 16 (!) 8  Temp:    SpO2: 100% 98%    Last Pain:  Vitals:   12/23/17 1035  TempSrc:   PainSc: 10-Worst pain ever                 Rosmary Dionisio,E. Margaretmary Prisk

## 2017-12-23 NOTE — Op Note (Signed)
    OPERATIVE REPORT  DATE OF SURGERY: 12/23/2017  PATIENT: Colin [name], 52 y.o. male MRN: [id number]  DOB: [birth year]  PRE-OPERATIVE DIAGNOSIS: Chronic renal insufficiency  POST-OPERATIVE DIAGNOSIS:  Same  PROCEDURE: Translocation of cephalic vein upper arm AV fistula  SURGEON:  Curt Jews, M.D.  PHYSICIAN ASSISTANT: Matt Eveland PA-C  ANESTHESIA: Local with sedation  EBL: 50 ml  Total I/O In: 250 [I.V.:250] Out: 250 [Urine:200; Blood:50]  BLOOD ADMINISTERED: None  DRAINS: None  SPECIMEN: None  COUNTS CORRECT:  YES  PLAN OF CARE: PACU  PATIENT DISPOSITION:  PACU - hemodynamically stable  PROCEDURE DETAILS: The patient was taken to the operating room placed supine position where the area of the right arm and right axilla were prepped and draped in the usual sterile fashion.  SonoSite ultrasound was used to visualize the upper arm cephalic vein.  The vein was of large caliber but was deep under the subcutaneous fat making it an usable.  There was also some scarring at the cephalic vein near the antecubital space with up clots shown on preoperative ultrasound in our office.  Decision was made to translocate the saphenous vein rather than attempting to resect the fat above this.  Incision was made over the prior antecubital incision carried down to isolate the brachial artery to cephalic vein anastomosis.  The vein was of large caliber at this position.  A separate incision was made in the mid upper arm and then one near the shoulder over the cephalic vein at the deltopectoral groove.  The tributary branches were ligated with 3-0 and 4-0 silk ties and divided.  The vein was mobilized circumferentially.  The vein was occluded near the brachial artery anastomosis and the vein was transected and was brought out through the tunnel.  The vein was occluded at the shoulder and was gently dilated and was of excellent caliber.  The vein was marked to prevent twisting in the tunnel.  A new  tunnel was created from the antecubital space to the shoulder incision taking care to make this is close to the skin as possible.  The patient was noted to have very thick skin.  The vein was brought back through the subcuticular tunnel and was cut to the appropriate length and was sewn into into the cephalic vein near the brachial artery anastomosis.  There were some thickened hypertrophic valves and these were resected.  The anastomosis with a 6-0 Prolene suture.  Clamps removed and excellent thrill was noted.  The wounds irrigated with saline.  Hemostasis obtained with cautery.  The wounds were closed with 3-0 Vicryl in the subcutaneous and subcuticular tissue.  Sterile dressing was applied and the patient was transferred to the recovery room in stable condition   [name], M.D., [facility] 12/23/2017 10:25 AM

## 2017-12-23 NOTE — Interval H&P Note (Signed)
History and Physical Interval Note:  12/23/2017 7:22 AM  Colin [name]  has presented today for surgery, with the diagnosis of CHRONIC KIDNEY DISEASE STAGE IV FOR HEMODIALYSIS ACCESS  The various methods of treatment have been discussed with the patient and family. After consideration of risks, benefits and other options for treatment, the patient has consented to  Procedure(s): REVISON OF ARTERIOVENOUS FISTULA RIGHT ARM (Right) as a surgical intervention .  The patient's history has been reviewed, patient examined, no change in status, stable for surgery.  I have reviewed the patient's chart and labs.  Questions were answered to the patient's satisfaction.     Curt Jews

## 2017-12-23 NOTE — Transfer of Care (Signed)
Immediate Anesthesia Transfer of Care Note  Patient: Susumu Hackler  Procedure(s) Performed: CEPHALIC VEIN TRANSPOSITION ARTERIOVENOUS FISTULA RIGHT UPPER ARM (Right Arm Upper)  Patient Location: PACU  Anesthesia Type:MAC  Level of Consciousness: awake, alert  and oriented  Airway & Oxygen Therapy: Patient connected to nasal cannula oxygen  Post-op Assessment: Post -op Vital signs reviewed and stable  Post vital signs: stable  Last Vitals:  Vitals:   12/23/17 0551 12/23/17 0635  BP: (!) 163/105 (!) 152/100  Pulse: 73   Resp: 20   Temp: 36.8 C   SpO2: 100%     Last Pain:  Vitals:   12/23/17 0551  TempSrc: Oral      Patients Stated Pain Goal: 3 (38/33/38 3291)  Complications: No apparent anesthesia complications

## 2017-12-23 NOTE — Anesthesia Procedure Notes (Signed)
Procedure Name: MAC Date/Time: 12/23/2017 7:40 AM Performed by: Lavell Luster, CRNA Pre-anesthesia Checklist: Patient identified, Emergency Drugs available, Suction available, Patient being monitored and Timeout performed Patient Re-evaluated:Patient Re-evaluated prior to induction Oxygen Delivery Method: Nasal cannula Induction Type: IV induction Placement Confirmation: breath sounds checked- equal and bilateral and positive ETCO2 Dental Injury: Teeth and Oropharynx as per pre-operative assessment

## 2017-12-24 ENCOUNTER — Encounter (HOSPITAL_COMMUNITY): Payer: Self-pay | Admitting: Vascular Surgery

## 2017-12-26 ENCOUNTER — Telehealth: Payer: Self-pay | Admitting: Vascular Surgery

## 2017-12-26 NOTE — Telephone Encounter (Signed)
LVM for pt re  01/24/18 appt

## 2017-12-26 NOTE — Telephone Encounter (Signed)
-----   Message from Mena Goes, RN sent at 12/23/2017  9:29 AM EST ----- Regarding: 4-6 weeks postop   ----- Message ----- From: Iline Oven Sent: 12/23/2017   9:12 AM To: [facility] Charge Pool  Can you schedule an appt for this pt in about 4-6 weeks.  PO R cephalic vein transposition. Thanks, [facility]

## 2018-01-17 ENCOUNTER — Encounter: Payer: Self-pay | Admitting: Endocrinology

## 2018-01-17 ENCOUNTER — Ambulatory Visit: Payer: Commercial Managed Care - PPO | Admitting: Endocrinology

## 2018-01-17 DIAGNOSIS — N62 Hypertrophy of breast: Secondary | ICD-10-CM | POA: Insufficient documentation

## 2018-01-17 DIAGNOSIS — Z125 Encounter for screening for malignant neoplasm of prostate: Secondary | ICD-10-CM

## 2018-01-17 DIAGNOSIS — E221 Hyperprolactinemia: Secondary | ICD-10-CM | POA: Insufficient documentation

## 2018-01-17 MED ORDER — BROMOCRIPTINE MESYLATE 2.5 MG PO TABS: 2.5000 mg | ORAL_TABLET | Freq: Every day | ORAL | 11 refills | Status: DC

## 2018-01-17 NOTE — Progress Notes (Signed)
Subjective:    Patient ID: Colin [name], male    DOB: 1965/12/30, 52 y.o.   MRN: [id number]  HPI Pt states 8 mos of slight swelling at the breasts, and assoc pain.  Pt reports he had puberty at the normal age.  He has 2 biological children.  He says he has never taken illicit androgens.  He has never been on any prescribed medication for hypogonadism.  He does not take antiandrogens or opioids.  He denies any h/o infertility, XRT, or genital infection.  He has never had surgery, or a serious injury to the head or genital area.  He has no h/o sleep apnea or DVT.   He does not consume alcohol excessively.  He has renal failure.  He had slight d/c from the right breast, and assoc pain at both breasts.   Past Medical History:  Diagnosis Date  . Anemia    iron deficiency  . Arthritis    Gout  . CKD (chronic kidney disease) stage 3, GFR 30-59 ml/min (HCC)    Stage 4  . Gout   . History of chicken pox   . Hypertension   . Melanosis coli   . Mumps   . Pneumonia   . Ulcerative colitis [place], [place]    Past Surgical History:  Procedure Laterality Date  . AV FISTULA PLACEMENT Right 08/27/2016   Procedure: RIGHT RADIOCEPHALIC ARTERIOVENOUS FISTULA CREATION;  Surgeon: [name], MD;  Location: [facility] OR;  Service: Vascular;  Laterality: Right;  . AV FISTULA PLACEMENT Right 10/21/2017   Procedure: ARTERIOVENOUS (AV) FISTULA CREATION RIGHT ARM;  Surgeon: [name], MD;  Location: [place];  Service: Vascular;  Laterality: Right;  . COLONOSCOPY    . FISTULA SUPERFICIALIZATION Right 11/12/2016   Procedure: FISTULA SUPERFICIALIZATION RIGHT FOREEARM;  Surgeon: [name], MD;  Location: [facility] OR;  Service: Vascular;  Laterality: Right;  . REVISON OF ARTERIOVENOUS FISTULA Right 12/23/2017   Procedure: CEPHALIC VEIN TRANSPOSITION ARTERIOVENOUS FISTULA RIGHT UPPER ARM;  Surgeon: [name], MD;  Location: [facility] OR;  Service: Vascular;  Laterality: Right;  . TENDON REPAIR  1987   Left index  finger    Social History   Socioeconomic History  . Marital status: Married    Spouse name: Not on file  . Number of children: Not on file  . Years of education: Not on file  . Highest education level: Not on file  Occupational History  . Not on file  Social Needs  . Financial resource strain: Not on file  . Food insecurity:    Worry: Not on file    Inability: Not on file  . Transportation needs:    Medical: Not on file    Non-medical: Not on file  Tobacco Use  . Smoking status: Never Smoker  . Smokeless tobacco: Former Systems developer    Types: Chew  Substance and Sexual Activity  . Alcohol use: Yes    Comment: rare  . Drug use: No  . Sexual activity: Not on file  Lifestyle  . Physical activity:    Days per week: Not on file    Minutes per session: Not on file  . Stress: Not on file  Relationships  . Social connections:    Talks on phone: Not on file    Gets together: Not on file    Attends religious service: Not on file    Active member of club or organization: Not on file  Attends meetings of clubs or organizations: Not on file    Relationship status: Not on file  . Intimate partner violence:    Fear of current or ex partner: Not on file    Emotionally abused: Not on file    Physically abused: Not on file    Forced sexual activity: Not on file  Other Topics Concern  . Not on file  Social History Narrative  . Not on file    Current Outpatient Medications on File Prior to Visit  Medication Sig Dispense Refill  . allopurinol (ZYLOPRIM) 100 MG tablet Take 100 mg by mouth daily.    Marland Kitchen amLODipine (NORVASC) 5 MG tablet Take 5 mg by mouth at bedtime.    Marland Kitchen atenolol (TENORMIN) 50 MG tablet Take 100 mg by mouth at bedtime.  5  . calcitRIOL (ROCALTROL) 0.25 MCG capsule Take 0.25 mcg by mouth daily.   6  . ferrous sulfate 325 (65 FE) MG tablet Take 325 mg by mouth at bedtime.    . furosemide (LASIX) 80 MG tablet Take 120 mg by mouth 2 (two) times daily.     Marland Kitchen HUMIRA PEN 40  MG/0.4ML PNKT Inject 40 mg into the muscle every 14 (fourteen) days.     . hydrALAZINE (APRESOLINE) 50 MG tablet Take 1 tablet (50 mg total) by mouth at bedtime. (Patient taking differently: Take 75 mg by mouth 3 (three) times daily. )    . HYDROcodone-acetaminophen (NORCO) 5-325 MG tablet Take 1 tablet by mouth every 6 (six) hours as needed for moderate pain. 8 tablet 0  . potassium chloride SA (K-DUR,KLOR-CON) 20 MEQ tablet Take 20 mEq by mouth daily.    . pravastatin (PRAVACHOL) 40 MG tablet Take 40 mg by mouth at bedtime.     . sevelamer carbonate (RENVELA) 800 MG tablet Take 1,600 mg by mouth 2 (two) times daily.     No current facility-administered medications on file prior to visit.     No Known Allergies  Family History  Problem Relation Age of Onset  . Hypertension Father   . Heart disease Father   . Renal Disease Father   . Hypertension Mother     BP 110/70 (BP Location: Right Arm, Patient Position: Sitting, Cuff Size: Large)   Pulse 88   Wt 249 lb 3.2 oz (113 kg)   BMI 41.47 kg/m    Review of Systems He reports insomnia and weight gain.  Denies numbness, erectile dysfunction, decreased urinary stream, muscle weakness, fever, headache, easy bruising, sob, rash, blurry vision, rhinorrhea, or chest pain.       Objective:   Physical Exam VS: see vs page GEN: no distress HEAD: head: no deformity eyes: no periorbital swelling, no proptosis external nose and ears are normal mouth: no lesion seen NECK: supple, thyroid is not enlarged CHEST WALL: no deformity LUNGS: clear to auscultation BREASTS:  Mild bilat gynecomastia.  No d/c is noted. CV: reg rate and rhythm, no murmur ABD: abdomen is soft, nontender.  no hepatosplenomegaly.  not distended.  no hernia GENITALIA:  Normal male.   MUSCULOSKELETAL: muscle bulk and strength are grossly normal.  no obvious joint swelling.  gait is normal and steady EXTEMITIES: no deformity.  no ulcer on the feet.  feet are of normal  color and temp.  no edema PULSES: dorsalis pedis intact bilat.  no carotid bruit NEURO:  cn 2-12 grossly intact.   readily moves all 4's.  sensation is intact to touch on the feet SKIN:  Normal texture and temperature.  No rash or suspicious lesion is visible.  Normal hair distribution.   NODES:  None palpable at the neck PSYCH: alert, well-oriented.  Does not appear anxious nor depressed.    noncontrast MRI: pituitary is normal  TSH=4 Prolactin=37 FSH=3 LH=7  I have reviewed outside records, and summarized: Pt was noted to have elevated prolactin, and referred here.  Other probs addressed were HTN, gout, UC, renal failure, and weight gain.     Assessment & Plan:  Hyperprolactinemia, new. Gynecomastia, prob caused or contributed to by the above.  Patient Instructions  Please start taking "bromocriptine," to help the prolactin. It has possible side effects of nausea and dizziness.  These go away with time.  You can avoid these by taking it at bedtime, and by taking just take 1/2 pill for the first week.   Please recheck the blood tests her in 1 month.  If the prolactin is better, but your symptoms persist, there is anotherr pill we can can add.   Losing weight also helps the breast growth Please come back for a follow-up appointment in 6 months.

## 2018-01-17 NOTE — Patient Instructions (Addendum)
Please start taking "bromocriptine," to help the prolactin. It has possible side effects of nausea and dizziness.  These go away with time.  You can avoid these by taking it at bedtime, and by taking just take 1/2 pill for the first week.   Please recheck the blood tests her in 1 month.  If the prolactin is better, but your symptoms persist, there is anotherr pill we can can add.   Losing weight also helps the breast growth Please come back for a follow-up appointment in 6 months.

## 2018-01-19 DIAGNOSIS — M25461 Effusion, right knee: Secondary | ICD-10-CM | POA: Diagnosis not present

## 2018-01-19 DIAGNOSIS — M25561 Pain in right knee: Secondary | ICD-10-CM | POA: Diagnosis not present

## 2018-01-19 DIAGNOSIS — N189 Chronic kidney disease, unspecified: Secondary | ICD-10-CM | POA: Diagnosis not present

## 2018-01-19 DIAGNOSIS — M25562 Pain in left knee: Secondary | ICD-10-CM | POA: Diagnosis not present

## 2018-01-19 DIAGNOSIS — Z79899 Other long term (current) drug therapy: Secondary | ICD-10-CM | POA: Diagnosis not present

## 2018-01-19 DIAGNOSIS — M1A00X0 Idiopathic chronic gout, unspecified site, without tophus (tophi): Secondary | ICD-10-CM | POA: Diagnosis not present

## 2018-01-24 ENCOUNTER — Encounter: Payer: Self-pay | Admitting: Vascular Surgery

## 2018-01-24 ENCOUNTER — Ambulatory Visit (INDEPENDENT_AMBULATORY_CARE_PROVIDER_SITE_OTHER): Payer: Self-pay | Admitting: Vascular Surgery

## 2018-01-24 ENCOUNTER — Other Ambulatory Visit: Payer: Self-pay

## 2018-01-24 VITALS — BP 139/89 | HR 70 | Temp 98.5°F | Resp 20 | Ht 65.0 in | Wt 247.0 lb

## 2018-01-24 DIAGNOSIS — N185 Chronic kidney disease, stage 5: Secondary | ICD-10-CM

## 2018-01-24 NOTE — Progress Notes (Signed)
  POST OPERATIVE OFFICE NOTE    CC:  F/u for surgery  HPI:  This is a 52 y.o. male who is s/p right arm brachial cephalic AV fistula creation in January 2019.  In March 2019, he underwent translocation of the cephalic vein upper arm AV fistula by Dr. Donnetta Hutching.  He presents today for follow up.  He states he has been doing well since surgery.  He denies any pain or numbness in his hand.  He does show me two spitting stitches from separate incisions.  He is not yet on dialysis and followed by Dr. Justin Mend.   No Known Allergies  Current Outpatient Medications  Medication Sig Dispense Refill  . allopurinol (ZYLOPRIM) 100 MG tablet Take 100 mg by mouth daily.    Marland Kitchen amLODipine (NORVASC) 5 MG tablet Take 5 mg by mouth at bedtime.    Marland Kitchen atenolol (TENORMIN) 50 MG tablet Take 100 mg by mouth at bedtime.  5  . bromocriptine (PARLODEL) 2.5 MG tablet Take 1 tablet (2.5 mg total) by mouth at bedtime. 30 tablet 11  . calcitRIOL (ROCALTROL) 0.25 MCG capsule Take 0.25 mcg by mouth daily.   6  . ferrous sulfate 325 (65 FE) MG tablet Take 325 mg by mouth at bedtime.    . furosemide (LASIX) 80 MG tablet Take 120 mg by mouth 2 (two) times daily.     Marland Kitchen HUMIRA PEN 40 MG/0.4ML PNKT Inject 40 mg into the muscle every 14 (fourteen) days.     . hydrALAZINE (APRESOLINE) 50 MG tablet Take 1 tablet (50 mg total) by mouth at bedtime. (Patient taking differently: Take 75 mg by mouth 3 (three) times daily. )    . HYDROcodone-acetaminophen (NORCO) 5-325 MG tablet Take 1 tablet by mouth every 6 (six) hours as needed for moderate pain. 8 tablet 0  . potassium chloride SA (K-DUR,KLOR-CON) 20 MEQ tablet Take 20 mEq by mouth daily.    . pravastatin (PRAVACHOL) 40 MG tablet Take 40 mg by mouth at bedtime.     . sevelamer carbonate (RENVELA) 800 MG tablet Take 1,600 mg by mouth 2 (two) times daily.     No current facility-administered medications for this visit.      ROS:  See HPI  Physical Exam:  Vitals:   01/24/18 1432  BP:  139/89  Pulse: 70  Resp: 20  Temp: 98.5 F (36.9 C)  SpO2: 98%    Incision:  All incisions have healed nicely with mild keloids.  There is a spitting stitch on the distal and proximal incisions.  Extremities:  Easily palpable right radial pulse; hand grip is strong; motor/sensation in tact;  Easily palpable fistula with excellent thrill/bruit present.   Assessment/Plan:  This is a 52 y.o. male who is s/p: right arm brachial cephalic AV fistula creation in January 2019.  In March 2019, he underwent translocation of the cephalic vein upper arm AV fistula by Dr. Donnetta Hutching.   -pt is doing well with  Easily palpable right radial pulse; hand grip is strong; motor/sensation in tact;  Easily palpable fistula with excellent thrill/bruit present. -his fistula is ready to use should he need dialysis.   -the spitting stitches were removed -he will f/u as needed.    Leontine Locket, PA-C [facility] [phone]  Clinic MD:  Pt seen and examined with Dr. Donnetta Hutching.

## 2018-02-22 ENCOUNTER — Other Ambulatory Visit (INDEPENDENT_AMBULATORY_CARE_PROVIDER_SITE_OTHER): Payer: Commercial Managed Care - PPO

## 2018-02-22 DIAGNOSIS — E221 Hyperprolactinemia: Secondary | ICD-10-CM | POA: Diagnosis not present

## 2018-02-22 DIAGNOSIS — I120 Hypertensive chronic kidney disease with stage 5 chronic kidney disease or end stage renal disease: Secondary | ICD-10-CM | POA: Diagnosis not present

## 2018-02-22 DIAGNOSIS — Z125 Encounter for screening for malignant neoplasm of prostate: Secondary | ICD-10-CM

## 2018-02-22 DIAGNOSIS — N189 Chronic kidney disease, unspecified: Secondary | ICD-10-CM | POA: Diagnosis not present

## 2018-02-22 DIAGNOSIS — N185 Chronic kidney disease, stage 5: Secondary | ICD-10-CM | POA: Diagnosis not present

## 2018-02-22 DIAGNOSIS — N2581 Secondary hyperparathyroidism of renal origin: Secondary | ICD-10-CM | POA: Diagnosis not present

## 2018-02-22 DIAGNOSIS — D631 Anemia in chronic kidney disease: Secondary | ICD-10-CM | POA: Diagnosis not present

## 2018-02-22 DIAGNOSIS — N62 Hypertrophy of breast: Secondary | ICD-10-CM | POA: Diagnosis not present

## 2018-02-22 LAB — PSA: PSA: 0.49 ng/mL (ref 0.10–4.00)

## 2018-02-23 LAB — TESTOSTERONE,FREE AND TOTAL
TESTOSTERONE FREE: 5.6 pg/mL — AB (ref 7.2–24.0)
TESTOSTERONE: 272 ng/dL (ref 264–916)

## 2018-02-28 LAB — ESTRADIOL, FREE
ESTRADIOL: 35 pg/mL — AB
Estradiol, Free: 0.84 pg/mL — ABNORMAL HIGH

## 2018-02-28 LAB — PROLACTIN: PROLACTIN: 15 ng/mL (ref 2.0–18.0)

## 2018-04-04 DIAGNOSIS — K529 Noninfective gastroenteritis and colitis, unspecified: Secondary | ICD-10-CM | POA: Diagnosis not present

## 2018-04-04 DIAGNOSIS — K5190 Ulcerative colitis, unspecified, without complications: Secondary | ICD-10-CM | POA: Diagnosis not present

## 2018-04-04 DIAGNOSIS — D123 Benign neoplasm of transverse colon: Secondary | ICD-10-CM | POA: Diagnosis not present

## 2018-04-04 DIAGNOSIS — K5140 Inflammatory polyps of colon without complications: Secondary | ICD-10-CM | POA: Diagnosis not present

## 2018-04-04 DIAGNOSIS — K6389 Other specified diseases of intestine: Secondary | ICD-10-CM | POA: Diagnosis not present

## 2018-04-04 DIAGNOSIS — Z1211 Encounter for screening for malignant neoplasm of colon: Secondary | ICD-10-CM | POA: Diagnosis not present

## 2018-04-04 DIAGNOSIS — K635 Polyp of colon: Secondary | ICD-10-CM | POA: Diagnosis not present

## 2018-04-10 DIAGNOSIS — N185 Chronic kidney disease, stage 5: Secondary | ICD-10-CM | POA: Diagnosis not present

## 2018-04-10 DIAGNOSIS — N189 Chronic kidney disease, unspecified: Secondary | ICD-10-CM | POA: Diagnosis not present

## 2018-04-10 DIAGNOSIS — I120 Hypertensive chronic kidney disease with stage 5 chronic kidney disease or end stage renal disease: Secondary | ICD-10-CM | POA: Diagnosis not present

## 2018-04-10 DIAGNOSIS — D631 Anemia in chronic kidney disease: Secondary | ICD-10-CM | POA: Diagnosis not present

## 2018-04-10 DIAGNOSIS — N2581 Secondary hyperparathyroidism of renal origin: Secondary | ICD-10-CM | POA: Diagnosis not present

## 2018-04-28 DIAGNOSIS — D689 Coagulation defect, unspecified: Secondary | ICD-10-CM | POA: Diagnosis not present

## 2018-04-28 DIAGNOSIS — Z111 Encounter for screening for respiratory tuberculosis: Secondary | ICD-10-CM | POA: Diagnosis not present

## 2018-04-28 DIAGNOSIS — N186 End stage renal disease: Secondary | ICD-10-CM | POA: Diagnosis not present

## 2018-05-01 DIAGNOSIS — N186 End stage renal disease: Secondary | ICD-10-CM | POA: Diagnosis not present

## 2018-05-01 DIAGNOSIS — D689 Coagulation defect, unspecified: Secondary | ICD-10-CM | POA: Diagnosis not present

## 2018-05-01 DIAGNOSIS — D509 Iron deficiency anemia, unspecified: Secondary | ICD-10-CM | POA: Diagnosis not present

## 2018-05-03 DIAGNOSIS — N186 End stage renal disease: Secondary | ICD-10-CM | POA: Diagnosis not present

## 2018-05-03 DIAGNOSIS — D689 Coagulation defect, unspecified: Secondary | ICD-10-CM | POA: Diagnosis not present

## 2018-05-03 DIAGNOSIS — D509 Iron deficiency anemia, unspecified: Secondary | ICD-10-CM | POA: Diagnosis not present

## 2018-05-04 DIAGNOSIS — Z992 Dependence on renal dialysis: Secondary | ICD-10-CM | POA: Diagnosis not present

## 2018-05-04 DIAGNOSIS — B353 Tinea pedis: Secondary | ICD-10-CM | POA: Diagnosis not present

## 2018-05-04 DIAGNOSIS — B354 Tinea corporis: Secondary | ICD-10-CM | POA: Diagnosis not present

## 2018-05-05 DIAGNOSIS — D689 Coagulation defect, unspecified: Secondary | ICD-10-CM | POA: Diagnosis not present

## 2018-05-05 DIAGNOSIS — D509 Iron deficiency anemia, unspecified: Secondary | ICD-10-CM | POA: Diagnosis not present

## 2018-05-05 DIAGNOSIS — N186 End stage renal disease: Secondary | ICD-10-CM | POA: Diagnosis not present

## 2018-05-08 DIAGNOSIS — D631 Anemia in chronic kidney disease: Secondary | ICD-10-CM | POA: Diagnosis not present

## 2018-05-08 DIAGNOSIS — N186 End stage renal disease: Secondary | ICD-10-CM | POA: Diagnosis not present

## 2018-05-08 DIAGNOSIS — D689 Coagulation defect, unspecified: Secondary | ICD-10-CM | POA: Diagnosis not present

## 2018-05-10 DIAGNOSIS — N186 End stage renal disease: Secondary | ICD-10-CM | POA: Diagnosis not present

## 2018-05-10 DIAGNOSIS — D631 Anemia in chronic kidney disease: Secondary | ICD-10-CM | POA: Diagnosis not present

## 2018-05-10 DIAGNOSIS — D689 Coagulation defect, unspecified: Secondary | ICD-10-CM | POA: Diagnosis not present

## 2018-05-12 DIAGNOSIS — N186 End stage renal disease: Secondary | ICD-10-CM | POA: Diagnosis not present

## 2018-05-12 DIAGNOSIS — D631 Anemia in chronic kidney disease: Secondary | ICD-10-CM | POA: Diagnosis not present

## 2018-05-12 DIAGNOSIS — D689 Coagulation defect, unspecified: Secondary | ICD-10-CM | POA: Diagnosis not present

## 2018-05-15 DIAGNOSIS — N186 End stage renal disease: Secondary | ICD-10-CM | POA: Diagnosis not present

## 2018-05-15 DIAGNOSIS — D689 Coagulation defect, unspecified: Secondary | ICD-10-CM | POA: Diagnosis not present

## 2018-05-15 DIAGNOSIS — D631 Anemia in chronic kidney disease: Secondary | ICD-10-CM | POA: Diagnosis not present

## 2018-05-17 DIAGNOSIS — N185 Chronic kidney disease, stage 5: Secondary | ICD-10-CM | POA: Diagnosis not present

## 2018-05-17 DIAGNOSIS — D631 Anemia in chronic kidney disease: Secondary | ICD-10-CM | POA: Diagnosis not present

## 2018-05-17 DIAGNOSIS — N186 End stage renal disease: Secondary | ICD-10-CM | POA: Diagnosis not present

## 2018-05-17 DIAGNOSIS — D689 Coagulation defect, unspecified: Secondary | ICD-10-CM | POA: Diagnosis not present

## 2018-05-19 DIAGNOSIS — N2581 Secondary hyperparathyroidism of renal origin: Secondary | ICD-10-CM | POA: Diagnosis not present

## 2018-05-19 DIAGNOSIS — D631 Anemia in chronic kidney disease: Secondary | ICD-10-CM | POA: Diagnosis not present

## 2018-05-19 DIAGNOSIS — N186 End stage renal disease: Secondary | ICD-10-CM | POA: Diagnosis not present

## 2018-05-22 DIAGNOSIS — N2581 Secondary hyperparathyroidism of renal origin: Secondary | ICD-10-CM | POA: Diagnosis not present

## 2018-05-22 DIAGNOSIS — N186 End stage renal disease: Secondary | ICD-10-CM | POA: Diagnosis not present

## 2018-05-22 DIAGNOSIS — D631 Anemia in chronic kidney disease: Secondary | ICD-10-CM | POA: Diagnosis not present

## 2018-05-26 DIAGNOSIS — N2581 Secondary hyperparathyroidism of renal origin: Secondary | ICD-10-CM | POA: Diagnosis not present

## 2018-05-26 DIAGNOSIS — D631 Anemia in chronic kidney disease: Secondary | ICD-10-CM | POA: Diagnosis not present

## 2018-05-26 DIAGNOSIS — N186 End stage renal disease: Secondary | ICD-10-CM | POA: Diagnosis not present

## 2018-05-29 DIAGNOSIS — N2581 Secondary hyperparathyroidism of renal origin: Secondary | ICD-10-CM | POA: Diagnosis not present

## 2018-05-29 DIAGNOSIS — N186 End stage renal disease: Secondary | ICD-10-CM | POA: Diagnosis not present

## 2018-05-29 DIAGNOSIS — D689 Coagulation defect, unspecified: Secondary | ICD-10-CM | POA: Diagnosis not present

## 2018-05-31 DIAGNOSIS — N186 End stage renal disease: Secondary | ICD-10-CM | POA: Diagnosis not present

## 2018-05-31 DIAGNOSIS — N2581 Secondary hyperparathyroidism of renal origin: Secondary | ICD-10-CM | POA: Diagnosis not present

## 2018-05-31 DIAGNOSIS — D689 Coagulation defect, unspecified: Secondary | ICD-10-CM | POA: Diagnosis not present

## 2018-06-02 DIAGNOSIS — N2581 Secondary hyperparathyroidism of renal origin: Secondary | ICD-10-CM | POA: Diagnosis not present

## 2018-06-02 DIAGNOSIS — N186 End stage renal disease: Secondary | ICD-10-CM | POA: Diagnosis not present

## 2018-06-02 DIAGNOSIS — D689 Coagulation defect, unspecified: Secondary | ICD-10-CM | POA: Diagnosis not present

## 2018-06-05 DIAGNOSIS — N2581 Secondary hyperparathyroidism of renal origin: Secondary | ICD-10-CM | POA: Diagnosis not present

## 2018-06-05 DIAGNOSIS — N186 End stage renal disease: Secondary | ICD-10-CM | POA: Diagnosis not present

## 2018-06-05 DIAGNOSIS — D689 Coagulation defect, unspecified: Secondary | ICD-10-CM | POA: Diagnosis not present

## 2018-06-07 DIAGNOSIS — D689 Coagulation defect, unspecified: Secondary | ICD-10-CM | POA: Diagnosis not present

## 2018-06-07 DIAGNOSIS — N2581 Secondary hyperparathyroidism of renal origin: Secondary | ICD-10-CM | POA: Diagnosis not present

## 2018-06-07 DIAGNOSIS — N186 End stage renal disease: Secondary | ICD-10-CM | POA: Diagnosis not present

## 2018-06-09 DIAGNOSIS — N186 End stage renal disease: Secondary | ICD-10-CM | POA: Diagnosis not present

## 2018-06-09 DIAGNOSIS — N2581 Secondary hyperparathyroidism of renal origin: Secondary | ICD-10-CM | POA: Diagnosis not present

## 2018-06-09 DIAGNOSIS — D689 Coagulation defect, unspecified: Secondary | ICD-10-CM | POA: Diagnosis not present

## 2018-06-12 DIAGNOSIS — N2581 Secondary hyperparathyroidism of renal origin: Secondary | ICD-10-CM | POA: Diagnosis not present

## 2018-06-12 DIAGNOSIS — D689 Coagulation defect, unspecified: Secondary | ICD-10-CM | POA: Diagnosis not present

## 2018-06-12 DIAGNOSIS — N186 End stage renal disease: Secondary | ICD-10-CM | POA: Diagnosis not present

## 2018-06-14 DIAGNOSIS — N186 End stage renal disease: Secondary | ICD-10-CM | POA: Diagnosis not present

## 2018-06-14 DIAGNOSIS — N2581 Secondary hyperparathyroidism of renal origin: Secondary | ICD-10-CM | POA: Diagnosis not present

## 2018-06-14 DIAGNOSIS — D689 Coagulation defect, unspecified: Secondary | ICD-10-CM | POA: Diagnosis not present

## 2018-06-15 DIAGNOSIS — Z23 Encounter for immunization: Secondary | ICD-10-CM | POA: Diagnosis not present

## 2018-06-15 DIAGNOSIS — N19 Unspecified kidney failure: Secondary | ICD-10-CM | POA: Diagnosis not present

## 2018-06-15 DIAGNOSIS — E7800 Pure hypercholesterolemia, unspecified: Secondary | ICD-10-CM | POA: Diagnosis not present

## 2018-06-15 DIAGNOSIS — N185 Chronic kidney disease, stage 5: Secondary | ICD-10-CM | POA: Diagnosis not present

## 2018-06-15 DIAGNOSIS — I129 Hypertensive chronic kidney disease with stage 1 through stage 4 chronic kidney disease, or unspecified chronic kidney disease: Secondary | ICD-10-CM | POA: Diagnosis not present

## 2018-06-15 DIAGNOSIS — R7309 Other abnormal glucose: Secondary | ICD-10-CM | POA: Diagnosis not present

## 2018-06-16 DIAGNOSIS — N186 End stage renal disease: Secondary | ICD-10-CM | POA: Diagnosis not present

## 2018-06-16 DIAGNOSIS — N2581 Secondary hyperparathyroidism of renal origin: Secondary | ICD-10-CM | POA: Diagnosis not present

## 2018-06-16 DIAGNOSIS — D689 Coagulation defect, unspecified: Secondary | ICD-10-CM | POA: Diagnosis not present

## 2018-06-17 DIAGNOSIS — I129 Hypertensive chronic kidney disease with stage 1 through stage 4 chronic kidney disease, or unspecified chronic kidney disease: Secondary | ICD-10-CM | POA: Diagnosis not present

## 2018-06-17 DIAGNOSIS — N186 End stage renal disease: Secondary | ICD-10-CM | POA: Diagnosis not present

## 2018-06-17 DIAGNOSIS — Z992 Dependence on renal dialysis: Secondary | ICD-10-CM | POA: Diagnosis not present

## 2018-06-19 DIAGNOSIS — N2581 Secondary hyperparathyroidism of renal origin: Secondary | ICD-10-CM | POA: Diagnosis not present

## 2018-06-19 DIAGNOSIS — N186 End stage renal disease: Secondary | ICD-10-CM | POA: Diagnosis not present

## 2018-06-19 DIAGNOSIS — D689 Coagulation defect, unspecified: Secondary | ICD-10-CM | POA: Diagnosis not present

## 2018-06-21 DIAGNOSIS — N186 End stage renal disease: Secondary | ICD-10-CM | POA: Diagnosis not present

## 2018-06-21 DIAGNOSIS — D689 Coagulation defect, unspecified: Secondary | ICD-10-CM | POA: Diagnosis not present

## 2018-06-21 DIAGNOSIS — N2581 Secondary hyperparathyroidism of renal origin: Secondary | ICD-10-CM | POA: Diagnosis not present

## 2018-06-23 DIAGNOSIS — D689 Coagulation defect, unspecified: Secondary | ICD-10-CM | POA: Diagnosis not present

## 2018-06-23 DIAGNOSIS — N186 End stage renal disease: Secondary | ICD-10-CM | POA: Diagnosis not present

## 2018-06-23 DIAGNOSIS — N2581 Secondary hyperparathyroidism of renal origin: Secondary | ICD-10-CM | POA: Diagnosis not present

## 2018-06-26 DIAGNOSIS — N2581 Secondary hyperparathyroidism of renal origin: Secondary | ICD-10-CM | POA: Diagnosis not present

## 2018-06-26 DIAGNOSIS — D689 Coagulation defect, unspecified: Secondary | ICD-10-CM | POA: Diagnosis not present

## 2018-06-26 DIAGNOSIS — N186 End stage renal disease: Secondary | ICD-10-CM | POA: Diagnosis not present

## 2018-06-28 DIAGNOSIS — N2581 Secondary hyperparathyroidism of renal origin: Secondary | ICD-10-CM | POA: Diagnosis not present

## 2018-06-28 DIAGNOSIS — N186 End stage renal disease: Secondary | ICD-10-CM | POA: Diagnosis not present

## 2018-06-28 DIAGNOSIS — D689 Coagulation defect, unspecified: Secondary | ICD-10-CM | POA: Diagnosis not present

## 2018-06-30 DIAGNOSIS — N2581 Secondary hyperparathyroidism of renal origin: Secondary | ICD-10-CM | POA: Diagnosis not present

## 2018-06-30 DIAGNOSIS — D689 Coagulation defect, unspecified: Secondary | ICD-10-CM | POA: Diagnosis not present

## 2018-06-30 DIAGNOSIS — N186 End stage renal disease: Secondary | ICD-10-CM | POA: Diagnosis not present

## 2018-07-03 DIAGNOSIS — N2581 Secondary hyperparathyroidism of renal origin: Secondary | ICD-10-CM | POA: Diagnosis not present

## 2018-07-03 DIAGNOSIS — N186 End stage renal disease: Secondary | ICD-10-CM | POA: Diagnosis not present

## 2018-07-03 DIAGNOSIS — D689 Coagulation defect, unspecified: Secondary | ICD-10-CM | POA: Diagnosis not present

## 2018-07-05 DIAGNOSIS — D689 Coagulation defect, unspecified: Secondary | ICD-10-CM | POA: Diagnosis not present

## 2018-07-05 DIAGNOSIS — N186 End stage renal disease: Secondary | ICD-10-CM | POA: Diagnosis not present

## 2018-07-05 DIAGNOSIS — N2581 Secondary hyperparathyroidism of renal origin: Secondary | ICD-10-CM | POA: Diagnosis not present

## 2018-07-07 DIAGNOSIS — D689 Coagulation defect, unspecified: Secondary | ICD-10-CM | POA: Diagnosis not present

## 2018-07-07 DIAGNOSIS — N186 End stage renal disease: Secondary | ICD-10-CM | POA: Diagnosis not present

## 2018-07-07 DIAGNOSIS — N2581 Secondary hyperparathyroidism of renal origin: Secondary | ICD-10-CM | POA: Diagnosis not present

## 2018-07-10 DIAGNOSIS — D689 Coagulation defect, unspecified: Secondary | ICD-10-CM | POA: Diagnosis not present

## 2018-07-10 DIAGNOSIS — N2581 Secondary hyperparathyroidism of renal origin: Secondary | ICD-10-CM | POA: Diagnosis not present

## 2018-07-10 DIAGNOSIS — N186 End stage renal disease: Secondary | ICD-10-CM | POA: Diagnosis not present

## 2018-07-12 DIAGNOSIS — N2581 Secondary hyperparathyroidism of renal origin: Secondary | ICD-10-CM | POA: Diagnosis not present

## 2018-07-12 DIAGNOSIS — D689 Coagulation defect, unspecified: Secondary | ICD-10-CM | POA: Diagnosis not present

## 2018-07-12 DIAGNOSIS — N186 End stage renal disease: Secondary | ICD-10-CM | POA: Diagnosis not present

## 2018-07-14 DIAGNOSIS — N2581 Secondary hyperparathyroidism of renal origin: Secondary | ICD-10-CM | POA: Diagnosis not present

## 2018-07-14 DIAGNOSIS — D689 Coagulation defect, unspecified: Secondary | ICD-10-CM | POA: Diagnosis not present

## 2018-07-14 DIAGNOSIS — N186 End stage renal disease: Secondary | ICD-10-CM | POA: Diagnosis not present

## 2018-07-17 DIAGNOSIS — D689 Coagulation defect, unspecified: Secondary | ICD-10-CM | POA: Diagnosis not present

## 2018-07-17 DIAGNOSIS — Z992 Dependence on renal dialysis: Secondary | ICD-10-CM | POA: Diagnosis not present

## 2018-07-17 DIAGNOSIS — N186 End stage renal disease: Secondary | ICD-10-CM | POA: Diagnosis not present

## 2018-07-17 DIAGNOSIS — I129 Hypertensive chronic kidney disease with stage 1 through stage 4 chronic kidney disease, or unspecified chronic kidney disease: Secondary | ICD-10-CM | POA: Diagnosis not present

## 2018-07-17 DIAGNOSIS — N2581 Secondary hyperparathyroidism of renal origin: Secondary | ICD-10-CM | POA: Diagnosis not present

## 2018-07-19 ENCOUNTER — Ambulatory Visit: Payer: Commercial Managed Care - PPO | Admitting: Endocrinology

## 2018-07-19 DIAGNOSIS — D689 Coagulation defect, unspecified: Secondary | ICD-10-CM | POA: Diagnosis not present

## 2018-07-19 DIAGNOSIS — N2581 Secondary hyperparathyroidism of renal origin: Secondary | ICD-10-CM | POA: Diagnosis not present

## 2018-07-19 DIAGNOSIS — N186 End stage renal disease: Secondary | ICD-10-CM | POA: Diagnosis not present

## 2018-07-19 DIAGNOSIS — Z0289 Encounter for other administrative examinations: Secondary | ICD-10-CM

## 2018-07-21 DIAGNOSIS — D689 Coagulation defect, unspecified: Secondary | ICD-10-CM | POA: Diagnosis not present

## 2018-07-21 DIAGNOSIS — N2581 Secondary hyperparathyroidism of renal origin: Secondary | ICD-10-CM | POA: Diagnosis not present

## 2018-07-21 DIAGNOSIS — N186 End stage renal disease: Secondary | ICD-10-CM | POA: Diagnosis not present

## 2018-07-24 DIAGNOSIS — N2581 Secondary hyperparathyroidism of renal origin: Secondary | ICD-10-CM | POA: Diagnosis not present

## 2018-07-24 DIAGNOSIS — D689 Coagulation defect, unspecified: Secondary | ICD-10-CM | POA: Diagnosis not present

## 2018-07-24 DIAGNOSIS — N186 End stage renal disease: Secondary | ICD-10-CM | POA: Diagnosis not present

## 2018-07-26 DIAGNOSIS — N2581 Secondary hyperparathyroidism of renal origin: Secondary | ICD-10-CM | POA: Diagnosis not present

## 2018-07-26 DIAGNOSIS — D689 Coagulation defect, unspecified: Secondary | ICD-10-CM | POA: Diagnosis not present

## 2018-07-26 DIAGNOSIS — N186 End stage renal disease: Secondary | ICD-10-CM | POA: Diagnosis not present

## 2018-07-28 DIAGNOSIS — N186 End stage renal disease: Secondary | ICD-10-CM | POA: Diagnosis not present

## 2018-07-28 DIAGNOSIS — N2581 Secondary hyperparathyroidism of renal origin: Secondary | ICD-10-CM | POA: Diagnosis not present

## 2018-07-28 DIAGNOSIS — D689 Coagulation defect, unspecified: Secondary | ICD-10-CM | POA: Diagnosis not present

## 2018-07-31 DIAGNOSIS — N186 End stage renal disease: Secondary | ICD-10-CM | POA: Diagnosis not present

## 2018-07-31 DIAGNOSIS — D631 Anemia in chronic kidney disease: Secondary | ICD-10-CM | POA: Diagnosis not present

## 2018-07-31 DIAGNOSIS — D689 Coagulation defect, unspecified: Secondary | ICD-10-CM | POA: Diagnosis not present

## 2018-08-02 DIAGNOSIS — N186 End stage renal disease: Secondary | ICD-10-CM | POA: Diagnosis not present

## 2018-08-02 DIAGNOSIS — D689 Coagulation defect, unspecified: Secondary | ICD-10-CM | POA: Diagnosis not present

## 2018-08-02 DIAGNOSIS — D631 Anemia in chronic kidney disease: Secondary | ICD-10-CM | POA: Diagnosis not present

## 2018-08-04 DIAGNOSIS — N186 End stage renal disease: Secondary | ICD-10-CM | POA: Diagnosis not present

## 2018-08-04 DIAGNOSIS — Z992 Dependence on renal dialysis: Secondary | ICD-10-CM | POA: Diagnosis not present

## 2018-08-07 DIAGNOSIS — N186 End stage renal disease: Secondary | ICD-10-CM | POA: Diagnosis not present

## 2018-08-07 DIAGNOSIS — D689 Coagulation defect, unspecified: Secondary | ICD-10-CM | POA: Diagnosis not present

## 2018-08-07 DIAGNOSIS — N2581 Secondary hyperparathyroidism of renal origin: Secondary | ICD-10-CM | POA: Diagnosis not present

## 2018-08-09 DIAGNOSIS — D689 Coagulation defect, unspecified: Secondary | ICD-10-CM | POA: Diagnosis not present

## 2018-08-09 DIAGNOSIS — N186 End stage renal disease: Secondary | ICD-10-CM | POA: Diagnosis not present

## 2018-08-09 DIAGNOSIS — N2581 Secondary hyperparathyroidism of renal origin: Secondary | ICD-10-CM | POA: Diagnosis not present

## 2018-08-11 DIAGNOSIS — D689 Coagulation defect, unspecified: Secondary | ICD-10-CM | POA: Diagnosis not present

## 2018-08-11 DIAGNOSIS — N186 End stage renal disease: Secondary | ICD-10-CM | POA: Diagnosis not present

## 2018-08-11 DIAGNOSIS — N2581 Secondary hyperparathyroidism of renal origin: Secondary | ICD-10-CM | POA: Diagnosis not present

## 2018-08-14 DIAGNOSIS — N2581 Secondary hyperparathyroidism of renal origin: Secondary | ICD-10-CM | POA: Diagnosis not present

## 2018-08-14 DIAGNOSIS — N186 End stage renal disease: Secondary | ICD-10-CM | POA: Diagnosis not present

## 2018-08-14 DIAGNOSIS — D689 Coagulation defect, unspecified: Secondary | ICD-10-CM | POA: Diagnosis not present

## 2018-08-15 ENCOUNTER — Ambulatory Visit (HOSPITAL_COMMUNITY)
Admission: EM | Admit: 2018-08-15 | Discharge: 2018-08-15 | Disposition: A | Discharge status: Home / Self Care | Payer: Commercial Managed Care - PPO | Attending: Family Medicine | Admitting: Family Medicine

## 2018-08-15 ENCOUNTER — Encounter (HOSPITAL_COMMUNITY): Payer: Self-pay

## 2018-08-15 DIAGNOSIS — M62830 Muscle spasm of back: Secondary | ICD-10-CM | POA: Diagnosis not present

## 2018-08-15 MED ORDER — CYCLOBENZAPRINE HCL 10 MG PO TABS: 10.0000 mg | ORAL_TABLET | Freq: Two times a day (BID) | ORAL | 0 refills | Status: DC | PRN

## 2018-08-15 MED ORDER — TRAMADOL HCL 50 MG PO TABS: 50.0000 mg | ORAL_TABLET | Freq: Two times a day (BID) | ORAL | 0 refills | Status: DC | PRN

## 2018-08-15 MED ORDER — DICLOFENAC SODIUM 1 % TD GEL: 2.0000 g | Freq: Four times a day (QID) | TRANSDERMAL | 0 refills | Status: DC

## 2018-08-15 NOTE — ED Triage Notes (Signed)
Pt presents with muscle spasms in neck and back.

## 2018-08-15 NOTE — Discharge Instructions (Signed)
Please apply Voltaren gel to area on neck and back, may also use Tylenol  You may use flexeril as needed to help with pain. This is a muscle relaxer and causes sedation- please use only at bedtime or when you will be home and not have to drive/work  I have given you 6 Tramadol to use for severe pain.  Please do not work or drive after taking.  Please do not mix with Flexeril as this also causes drowsiness.  Please follow-up if developing worsening pain, numbness or tingling, weakness in upper extremities or lower extremities, chest pain, shortness of breath, loss of bowel or bladder control, numbness between legs.

## 2018-08-16 DIAGNOSIS — D689 Coagulation defect, unspecified: Secondary | ICD-10-CM | POA: Diagnosis not present

## 2018-08-16 DIAGNOSIS — N2581 Secondary hyperparathyroidism of renal origin: Secondary | ICD-10-CM | POA: Diagnosis not present

## 2018-08-16 DIAGNOSIS — N186 End stage renal disease: Secondary | ICD-10-CM | POA: Diagnosis not present

## 2018-08-16 NOTE — ED Provider Notes (Addendum)
[place]    CSN: [id number] Arrival date & time: 08/15/18  1621     History   Chief Complaint Chief Complaint  Patient presents with  . Muscle Spasms in Neck and Back    HPI Colin [name] is a 52 y.o. male history of gout, arthritis, hypertension, hyperlipidemia presenting today for evaluation of right neck stiffness and lower back spasms.  Patient states that on Sunday he started to develop left lower back pain with occasional spasming.  He also had stiffness in his left neck/shoulder this day as well.  Since then it has moved to his right shoulder, but has had persistent spasming in his lower back.  He denies any specific injury or increase in activity.  He has tried heat, Tylenol and lidocaine patches.  He has to avoid NSAIDs due to being a dialysis patient.  He denies any weakness in upper extremities or lower extremities.  Denies any numbness or tingling.  Changes in bowel or bladder control.  Denies saddle anesthesia.  HPI  Past Medical History:  Diagnosis Date  . Anemia    iron deficiency  . Arthritis    Gout  . CKD (chronic kidney disease) stage 3, GFR 30-59 ml/min (HCC)    Stage 4  . Gout   . History of chicken pox   . Hypertension   . Melanosis coli   . Mumps   . Pneumonia   . Ulcerative colitis [place], [place]    Patient Active Problem List   Diagnosis Date Noted  . Hyperprolactinemia ([place]) 01/17/2018  . Screening for malignant neoplasm of prostate 01/17/2018  . Gynecomastia 01/17/2018  . Ingrown toenail 06/21/2017  . Onychomycosis 06/21/2017  . ESRD (end stage renal disease) ([place]) 02/07/2017  . Morbid obesity ([place]) 02/07/2017  . Bilateral leg edema 10/02/2015  . Hyperlipidemia 10/02/2015  . Edema 02/09/2011  . Chronic renal insufficiency 02/09/2011  . ANEMIA, IRON DEFICIENCY 10/16/2007  . Essential hypertension 10/16/2007  . Ulcerative colitis ([place]) 02/13/2007  . MELANOSIS COLI 02/13/2007    Past Surgical History:    Procedure Laterality Date  . AV FISTULA PLACEMENT Right 08/27/2016   Procedure: RIGHT RADIOCEPHALIC ARTERIOVENOUS FISTULA CREATION;  Surgeon: [name], MD;  Location: [facility] OR;  Service: Vascular;  Laterality: Right;  . AV FISTULA PLACEMENT Right 10/21/2017   Procedure: ARTERIOVENOUS (AV) FISTULA CREATION RIGHT ARM;  Surgeon: [name], MD;  Location: [place];  Service: Vascular;  Laterality: Right;  . COLONOSCOPY    . FISTULA SUPERFICIALIZATION Right 11/12/2016   Procedure: FISTULA SUPERFICIALIZATION RIGHT FOREEARM;  Surgeon: [name], MD;  Location: [facility] OR;  Service: Vascular;  Laterality: Right;  . REVISON OF ARTERIOVENOUS FISTULA Right 12/23/2017   Procedure: CEPHALIC VEIN TRANSPOSITION ARTERIOVENOUS FISTULA RIGHT UPPER ARM;  Surgeon: [name], MD;  Location: [facility] OR;  Service: Vascular;  Laterality: Right;  . TENDON REPAIR  1987   Left index finger       Home Medications    Prior to Admission medications   Medication Sig Start Date End Date Taking? Authorizing Provider  allopurinol (ZYLOPRIM) 100 MG tablet Take 100 mg by mouth daily.    [provider]  amLODipine (NORVASC) 5 MG tablet Take 5 mg by mouth at bedtime.    [provider]  atenolol (TENORMIN) 50 MG tablet Take 100 mg by mouth at bedtime. 09/04/17   [provider]  bromocriptine (PARLODEL) 2.5 MG tablet Take 1 tablet (2.5 mg total) by  mouth at bedtime. 01/17/18   Renato Shin, MD  calcitRIOL (ROCALTROL) 0.25 MCG capsule Take 0.25 mcg by mouth daily.  11/12/17   [provider]  cyclobenzaprine (FLEXERIL) 10 MG tablet Take 1 tablet (10 mg total) by mouth 2 (two) times daily as needed for muscle spasms. 08/15/18   Wieters, Hallie C, PA-C  diclofenac sodium (VOLTAREN) 1 % GEL Apply 2 g topically 4 (four) times daily. 08/15/18   Wieters, Hallie C, PA-C  ferrous sulfate 325 (65 FE) MG tablet Take 325 mg by mouth at bedtime.    [provider]  furosemide (LASIX) 80 MG tablet  Take 120 mg by mouth 2 (two) times daily.     [provider]  HUMIRA PEN 40 MG/0.4ML PNKT Inject 40 mg into the muscle every 14 (fourteen) days.  11/22/17   [provider]  hydrALAZINE (APRESOLINE) 50 MG tablet Take 1 tablet (50 mg total) by mouth at bedtime. Patient taking differently: Take 75 mg by mouth 3 (three) times daily.  08/27/16   Alvia Grove, PA-C  HYDROcodone-acetaminophen (NORCO) 5-325 MG tablet Take 1 tablet by mouth every 6 (six) hours as needed for moderate pain. 12/23/17   Dagoberto Ligas, PA-C  potassium chloride SA (K-DUR,KLOR-CON) 20 MEQ tablet Take 20 mEq by mouth daily.    [provider]  pravastatin (PRAVACHOL) 40 MG tablet Take 40 mg by mouth at bedtime.     [provider]  sevelamer carbonate (RENVELA) 800 MG tablet Take 1,600 mg by mouth 2 (two) times daily.    [provider]  traMADol (ULTRAM) 50 MG tablet Take 1 tablet (50 mg total) by mouth every 12 (twelve) hours as needed. 08/15/18   Wieters, Elesa Hacker, PA-C    Family History Family History  Problem Relation Age of Onset  . Hypertension Father   . Heart disease Father   . Renal Disease Father   . Hypertension Mother     Social History Social History   Tobacco Use  . Smoking status: Never Smoker  . Smokeless tobacco: Former Systems developer    Types: Chew  Substance Use Topics  . Alcohol use: Yes    Comment: rare  . Drug use: No     Allergies   Patient has no known allergies.   Review of Systems Review of Systems  Constitutional: Negative for fatigue and fever.  Eyes: Negative for redness, itching and visual disturbance.  Respiratory: Negative for shortness of breath.   Cardiovascular: Negative for chest pain and leg swelling.  Gastrointestinal: Negative for nausea and vomiting.  Genitourinary: Negative for decreased urine volume and difficulty urinating.  Musculoskeletal: Positive for back pain, myalgias, neck pain and neck stiffness. Negative for  arthralgias and gait problem.  Skin: Negative for color change, rash and wound.  Neurological: Negative for dizziness, syncope, weakness, light-headedness, numbness and headaches.     Physical Exam Triage Vital Signs ED Triage Vitals  Enc Vitals Group     BP 08/15/18 1705 127/84     Pulse Rate 08/15/18 1705 60     Resp 08/15/18 1705 18     Temp 08/15/18 1705 98.1 F (36.7 C)     Temp Source 08/15/18 1705 Oral     SpO2 08/15/18 1705 99 %     Weight --      Height --      Head Circumference --      Peak Flow --      Pain Score 08/15/18 1706 8  Pain Loc --      Pain Edu? --      Excl. in [place]? --    No data found.  Updated Vital Signs BP 127/84 (BP Location: Right Arm)   Pulse 60   Temp 98.1 F (36.7 C) (Oral)   Resp 18   SpO2 99%   Visual Acuity Right Eye Distance:   Left Eye Distance:   Bilateral Distance:    Right Eye Near:   Left Eye Near:    Bilateral Near:     Physical Exam  Constitutional: He appears well-developed and well-nourished.  HENT:  Head: Normocephalic and atraumatic.  Eyes: Conjunctivae are normal.  Neck: Neck supple.  Cardiovascular: Normal rate and regular rhythm.  No murmur heard. Pulmonary/Chest: Effort normal and breath sounds normal. No respiratory distress.  Abdominal: Soft. There is no tenderness.  Musculoskeletal: He exhibits no edema.  Nontender to palpation of cervical, thoracic or lumbar spine midline.  Tenderness to palpation overlying left lumbar musculature as well as right trapezius musculature.  Full active range of motion of upper extremities.  Strength 5/5 and equal bilaterally at shoulders. Negative straight leg raise bilaterally Hip and knee strength 5/5 ankle bilaterally, patellar reflex 2+ bilaterally  Gait without abnormality, moving around easily  Neurological: He is alert.  Skin: Skin is warm and dry.  Psychiatric: He has a normal mood and affect.  Nursing note and vitals reviewed.    UC Treatments / Results   Labs (all labs ordered are listed, but only abnormal results are displayed) Labs Reviewed - No data to display  EKG None  Radiology No results found.  Procedures Procedures (including critical care time)  Medications Ordered in UC Medications - No data to display  Initial Impression / Assessment and Plan / UC Course  I have reviewed the triage vital signs and the nursing notes.  Pertinent labs & imaging results that were available during my care of the patient were reviewed by me and considered in my medical decision making (see chart for details).     Patient likely with muscle strain versus spasm of lower back and trapezius musculature.  Unclear cause, no injury.  Symptoms for 3 days.  Recommend conservative treatment with continuing anti-inflammatories of Tylenol.  We will continue to avoid NSAIDs, will provide topical Voltaren and Flexeril to use.  Did provide patient with a few tablets of tramadol to use only for severe pain given he had not use NSAIDs.  Advised of drowsiness with this and Flexeril, advised not to use together and also advised not to use either with driving or working.  Gentle neck stretching recommended.  Advised to still go about daily activities, avoid heavy lifting.Discussed strict return precautions. Patient verbalized understanding and is agreeable with plan.  Final Clinical Impressions(s) / UC Diagnoses   Final diagnoses:  Back muscle spasm     Discharge Instructions     Please apply Voltaren gel to area on neck and back, may also use Tylenol  You may use flexeril as needed to help with pain. This is a muscle relaxer and causes sedation- please use only at bedtime or when you will be home and not have to drive/work  I have given you 6 Tramadol to use for severe pain.  Please do not work or drive after taking.  Please do not mix with Flexeril as this also causes drowsiness.  Please follow-up if developing worsening pain, numbness or tingling,  weakness in upper extremities or lower extremities, chest pain,  shortness of breath, loss of bowel or bladder control, numbness between legs.   ED Prescriptions    Medication Sig Dispense Auth. Provider   cyclobenzaprine (FLEXERIL) 10 MG tablet Take 1 tablet (10 mg total) by mouth 2 (two) times daily as needed for muscle spasms. 20 tablet Wieters, Hallie C, PA-C   diclofenac sodium (VOLTAREN) 1 % GEL Apply 2 g topically 4 (four) times daily. 100 g Wieters, Hallie C, PA-C   traMADol (ULTRAM) 50 MG tablet Take 1 tablet (50 mg total) by mouth every 12 (twelve) hours as needed. 6 tablet Wieters, [place] C, PA-C     Controlled Substance Prescriptions [place] Controlled Substance Registry consulted?  Yes registry checked, he will benefit is greater than risk.   Wieters, [place] C, PA-C 08/16/18 1014    Wieters, [place] C, PA-C 08/16/18 1015

## 2018-08-17 DIAGNOSIS — I129 Hypertensive chronic kidney disease with stage 1 through stage 4 chronic kidney disease, or unspecified chronic kidney disease: Secondary | ICD-10-CM | POA: Diagnosis not present

## 2018-08-17 DIAGNOSIS — Z992 Dependence on renal dialysis: Secondary | ICD-10-CM | POA: Diagnosis not present

## 2018-08-17 DIAGNOSIS — N186 End stage renal disease: Secondary | ICD-10-CM | POA: Diagnosis not present

## 2018-08-18 DIAGNOSIS — D689 Coagulation defect, unspecified: Secondary | ICD-10-CM | POA: Diagnosis not present

## 2018-08-18 DIAGNOSIS — N186 End stage renal disease: Secondary | ICD-10-CM | POA: Diagnosis not present

## 2018-08-18 DIAGNOSIS — N2581 Secondary hyperparathyroidism of renal origin: Secondary | ICD-10-CM | POA: Diagnosis not present

## 2018-08-21 DIAGNOSIS — D689 Coagulation defect, unspecified: Secondary | ICD-10-CM | POA: Diagnosis not present

## 2018-08-21 DIAGNOSIS — M545 Low back pain: Secondary | ICD-10-CM | POA: Diagnosis not present

## 2018-08-21 DIAGNOSIS — N186 End stage renal disease: Secondary | ICD-10-CM | POA: Diagnosis not present

## 2018-08-21 DIAGNOSIS — N2581 Secondary hyperparathyroidism of renal origin: Secondary | ICD-10-CM | POA: Diagnosis not present

## 2018-08-21 DIAGNOSIS — G243 Spasmodic torticollis: Secondary | ICD-10-CM | POA: Diagnosis not present

## 2018-08-23 DIAGNOSIS — N186 End stage renal disease: Secondary | ICD-10-CM | POA: Diagnosis not present

## 2018-08-23 DIAGNOSIS — D689 Coagulation defect, unspecified: Secondary | ICD-10-CM | POA: Diagnosis not present

## 2018-08-23 DIAGNOSIS — N2581 Secondary hyperparathyroidism of renal origin: Secondary | ICD-10-CM | POA: Diagnosis not present

## 2018-08-24 DIAGNOSIS — M545 Low back pain: Secondary | ICD-10-CM | POA: Diagnosis not present

## 2018-08-24 DIAGNOSIS — R21 Rash and other nonspecific skin eruption: Secondary | ICD-10-CM | POA: Diagnosis not present

## 2018-08-24 DIAGNOSIS — M25511 Pain in right shoulder: Secondary | ICD-10-CM | POA: Diagnosis not present

## 2018-08-25 DIAGNOSIS — D689 Coagulation defect, unspecified: Secondary | ICD-10-CM | POA: Diagnosis not present

## 2018-08-25 DIAGNOSIS — N2581 Secondary hyperparathyroidism of renal origin: Secondary | ICD-10-CM | POA: Diagnosis not present

## 2018-08-25 DIAGNOSIS — N186 End stage renal disease: Secondary | ICD-10-CM | POA: Diagnosis not present

## 2018-08-28 DIAGNOSIS — D689 Coagulation defect, unspecified: Secondary | ICD-10-CM | POA: Diagnosis not present

## 2018-08-28 DIAGNOSIS — N186 End stage renal disease: Secondary | ICD-10-CM | POA: Diagnosis not present

## 2018-08-28 DIAGNOSIS — N2581 Secondary hyperparathyroidism of renal origin: Secondary | ICD-10-CM | POA: Diagnosis not present

## 2018-08-29 DIAGNOSIS — M546 Pain in thoracic spine: Secondary | ICD-10-CM | POA: Diagnosis not present

## 2018-08-29 DIAGNOSIS — M542 Cervicalgia: Secondary | ICD-10-CM | POA: Diagnosis not present

## 2018-08-30 DIAGNOSIS — N186 End stage renal disease: Secondary | ICD-10-CM | POA: Diagnosis not present

## 2018-08-30 DIAGNOSIS — D689 Coagulation defect, unspecified: Secondary | ICD-10-CM | POA: Diagnosis not present

## 2018-08-30 DIAGNOSIS — N2581 Secondary hyperparathyroidism of renal origin: Secondary | ICD-10-CM | POA: Diagnosis not present

## 2018-09-01 DIAGNOSIS — N186 End stage renal disease: Secondary | ICD-10-CM | POA: Diagnosis not present

## 2018-09-01 DIAGNOSIS — D689 Coagulation defect, unspecified: Secondary | ICD-10-CM | POA: Diagnosis not present

## 2018-09-01 DIAGNOSIS — N2581 Secondary hyperparathyroidism of renal origin: Secondary | ICD-10-CM | POA: Diagnosis not present

## 2018-09-04 DIAGNOSIS — N186 End stage renal disease: Secondary | ICD-10-CM | POA: Diagnosis not present

## 2018-09-04 DIAGNOSIS — N2581 Secondary hyperparathyroidism of renal origin: Secondary | ICD-10-CM | POA: Diagnosis not present

## 2018-09-04 DIAGNOSIS — D689 Coagulation defect, unspecified: Secondary | ICD-10-CM | POA: Diagnosis not present

## 2018-09-06 DIAGNOSIS — N186 End stage renal disease: Secondary | ICD-10-CM | POA: Diagnosis not present

## 2018-09-06 DIAGNOSIS — D689 Coagulation defect, unspecified: Secondary | ICD-10-CM | POA: Diagnosis not present

## 2018-09-06 DIAGNOSIS — N2581 Secondary hyperparathyroidism of renal origin: Secondary | ICD-10-CM | POA: Diagnosis not present

## 2018-09-08 DIAGNOSIS — N2581 Secondary hyperparathyroidism of renal origin: Secondary | ICD-10-CM | POA: Diagnosis not present

## 2018-09-08 DIAGNOSIS — N186 End stage renal disease: Secondary | ICD-10-CM | POA: Diagnosis not present

## 2018-09-08 DIAGNOSIS — D689 Coagulation defect, unspecified: Secondary | ICD-10-CM | POA: Diagnosis not present

## 2018-09-10 DIAGNOSIS — N186 End stage renal disease: Secondary | ICD-10-CM | POA: Diagnosis not present

## 2018-09-10 DIAGNOSIS — D689 Coagulation defect, unspecified: Secondary | ICD-10-CM | POA: Diagnosis not present

## 2018-09-10 DIAGNOSIS — N2581 Secondary hyperparathyroidism of renal origin: Secondary | ICD-10-CM | POA: Diagnosis not present

## 2018-09-11 DIAGNOSIS — M542 Cervicalgia: Secondary | ICD-10-CM | POA: Diagnosis not present

## 2018-09-11 DIAGNOSIS — Z6835 Body mass index (BMI) 35.0-35.9, adult: Secondary | ICD-10-CM | POA: Diagnosis not present

## 2018-09-12 DIAGNOSIS — D689 Coagulation defect, unspecified: Secondary | ICD-10-CM | POA: Diagnosis not present

## 2018-09-12 DIAGNOSIS — N2581 Secondary hyperparathyroidism of renal origin: Secondary | ICD-10-CM | POA: Diagnosis not present

## 2018-09-12 DIAGNOSIS — N186 End stage renal disease: Secondary | ICD-10-CM | POA: Diagnosis not present

## 2018-09-15 DIAGNOSIS — N186 End stage renal disease: Secondary | ICD-10-CM | POA: Diagnosis not present

## 2018-09-15 DIAGNOSIS — D689 Coagulation defect, unspecified: Secondary | ICD-10-CM | POA: Diagnosis not present

## 2018-09-15 DIAGNOSIS — N2581 Secondary hyperparathyroidism of renal origin: Secondary | ICD-10-CM | POA: Diagnosis not present

## 2018-09-16 DIAGNOSIS — Z992 Dependence on renal dialysis: Secondary | ICD-10-CM | POA: Diagnosis not present

## 2018-09-16 DIAGNOSIS — N186 End stage renal disease: Secondary | ICD-10-CM | POA: Diagnosis not present

## 2018-09-16 DIAGNOSIS — I129 Hypertensive chronic kidney disease with stage 1 through stage 4 chronic kidney disease, or unspecified chronic kidney disease: Secondary | ICD-10-CM | POA: Diagnosis not present

## 2018-09-18 DIAGNOSIS — D689 Coagulation defect, unspecified: Secondary | ICD-10-CM | POA: Diagnosis not present

## 2018-09-18 DIAGNOSIS — N2581 Secondary hyperparathyroidism of renal origin: Secondary | ICD-10-CM | POA: Diagnosis not present

## 2018-09-18 DIAGNOSIS — N186 End stage renal disease: Secondary | ICD-10-CM | POA: Diagnosis not present

## 2018-09-20 DIAGNOSIS — D689 Coagulation defect, unspecified: Secondary | ICD-10-CM | POA: Diagnosis not present

## 2018-09-20 DIAGNOSIS — N2581 Secondary hyperparathyroidism of renal origin: Secondary | ICD-10-CM | POA: Diagnosis not present

## 2018-09-20 DIAGNOSIS — N186 End stage renal disease: Secondary | ICD-10-CM | POA: Diagnosis not present

## 2018-09-22 DIAGNOSIS — D689 Coagulation defect, unspecified: Secondary | ICD-10-CM | POA: Diagnosis not present

## 2018-09-22 DIAGNOSIS — N186 End stage renal disease: Secondary | ICD-10-CM | POA: Diagnosis not present

## 2018-09-22 DIAGNOSIS — N2581 Secondary hyperparathyroidism of renal origin: Secondary | ICD-10-CM | POA: Diagnosis not present

## 2018-09-25 DIAGNOSIS — N186 End stage renal disease: Secondary | ICD-10-CM | POA: Diagnosis not present

## 2018-09-25 DIAGNOSIS — N2581 Secondary hyperparathyroidism of renal origin: Secondary | ICD-10-CM | POA: Diagnosis not present

## 2018-09-25 DIAGNOSIS — D689 Coagulation defect, unspecified: Secondary | ICD-10-CM | POA: Diagnosis not present

## 2018-09-26 DIAGNOSIS — M542 Cervicalgia: Secondary | ICD-10-CM | POA: Diagnosis not present

## 2018-09-27 DIAGNOSIS — N2581 Secondary hyperparathyroidism of renal origin: Secondary | ICD-10-CM | POA: Diagnosis not present

## 2018-09-27 DIAGNOSIS — N186 End stage renal disease: Secondary | ICD-10-CM | POA: Diagnosis not present

## 2018-09-27 DIAGNOSIS — D689 Coagulation defect, unspecified: Secondary | ICD-10-CM | POA: Diagnosis not present

## 2018-09-29 DIAGNOSIS — N186 End stage renal disease: Secondary | ICD-10-CM | POA: Diagnosis not present

## 2018-09-29 DIAGNOSIS — D689 Coagulation defect, unspecified: Secondary | ICD-10-CM | POA: Diagnosis not present

## 2018-09-29 DIAGNOSIS — N2581 Secondary hyperparathyroidism of renal origin: Secondary | ICD-10-CM | POA: Diagnosis not present

## 2018-10-02 DIAGNOSIS — N186 End stage renal disease: Secondary | ICD-10-CM | POA: Diagnosis not present

## 2018-10-02 DIAGNOSIS — D689 Coagulation defect, unspecified: Secondary | ICD-10-CM | POA: Diagnosis not present

## 2018-10-02 DIAGNOSIS — N2581 Secondary hyperparathyroidism of renal origin: Secondary | ICD-10-CM | POA: Diagnosis not present

## 2018-10-04 DIAGNOSIS — N2581 Secondary hyperparathyroidism of renal origin: Secondary | ICD-10-CM | POA: Diagnosis not present

## 2018-10-04 DIAGNOSIS — N186 End stage renal disease: Secondary | ICD-10-CM | POA: Diagnosis not present

## 2018-10-04 DIAGNOSIS — D689 Coagulation defect, unspecified: Secondary | ICD-10-CM | POA: Diagnosis not present

## 2018-10-06 DIAGNOSIS — N2581 Secondary hyperparathyroidism of renal origin: Secondary | ICD-10-CM | POA: Diagnosis not present

## 2018-10-06 DIAGNOSIS — D689 Coagulation defect, unspecified: Secondary | ICD-10-CM | POA: Diagnosis not present

## 2018-10-06 DIAGNOSIS — N186 End stage renal disease: Secondary | ICD-10-CM | POA: Diagnosis not present

## 2018-10-08 DIAGNOSIS — D689 Coagulation defect, unspecified: Secondary | ICD-10-CM | POA: Diagnosis not present

## 2018-10-08 DIAGNOSIS — N186 End stage renal disease: Secondary | ICD-10-CM | POA: Diagnosis not present

## 2018-10-08 DIAGNOSIS — N2581 Secondary hyperparathyroidism of renal origin: Secondary | ICD-10-CM | POA: Diagnosis not present

## 2018-10-09 DIAGNOSIS — M542 Cervicalgia: Secondary | ICD-10-CM | POA: Diagnosis not present

## 2018-10-10 DIAGNOSIS — D689 Coagulation defect, unspecified: Secondary | ICD-10-CM | POA: Diagnosis not present

## 2018-10-10 DIAGNOSIS — N2581 Secondary hyperparathyroidism of renal origin: Secondary | ICD-10-CM | POA: Diagnosis not present

## 2018-10-10 DIAGNOSIS — N186 End stage renal disease: Secondary | ICD-10-CM | POA: Diagnosis not present

## 2018-10-12 DIAGNOSIS — M542 Cervicalgia: Secondary | ICD-10-CM | POA: Diagnosis not present

## 2018-10-13 DIAGNOSIS — D689 Coagulation defect, unspecified: Secondary | ICD-10-CM | POA: Diagnosis not present

## 2018-10-13 DIAGNOSIS — N186 End stage renal disease: Secondary | ICD-10-CM | POA: Diagnosis not present

## 2018-10-13 DIAGNOSIS — N2581 Secondary hyperparathyroidism of renal origin: Secondary | ICD-10-CM | POA: Diagnosis not present

## 2018-10-15 DIAGNOSIS — N2581 Secondary hyperparathyroidism of renal origin: Secondary | ICD-10-CM | POA: Diagnosis not present

## 2018-10-15 DIAGNOSIS — D689 Coagulation defect, unspecified: Secondary | ICD-10-CM | POA: Diagnosis not present

## 2018-10-15 DIAGNOSIS — N186 End stage renal disease: Secondary | ICD-10-CM | POA: Diagnosis not present

## 2018-10-16 DIAGNOSIS — M542 Cervicalgia: Secondary | ICD-10-CM | POA: Diagnosis not present

## 2018-10-17 DIAGNOSIS — Z992 Dependence on renal dialysis: Secondary | ICD-10-CM | POA: Diagnosis not present

## 2018-10-17 DIAGNOSIS — N2581 Secondary hyperparathyroidism of renal origin: Secondary | ICD-10-CM | POA: Diagnosis not present

## 2018-10-17 DIAGNOSIS — I129 Hypertensive chronic kidney disease with stage 1 through stage 4 chronic kidney disease, or unspecified chronic kidney disease: Secondary | ICD-10-CM | POA: Diagnosis not present

## 2018-10-17 DIAGNOSIS — N186 End stage renal disease: Secondary | ICD-10-CM | POA: Diagnosis not present

## 2018-10-17 DIAGNOSIS — D689 Coagulation defect, unspecified: Secondary | ICD-10-CM | POA: Diagnosis not present

## 2018-10-19 DIAGNOSIS — M542 Cervicalgia: Secondary | ICD-10-CM | POA: Diagnosis not present

## 2018-10-20 DIAGNOSIS — N186 End stage renal disease: Secondary | ICD-10-CM | POA: Diagnosis not present

## 2018-10-20 DIAGNOSIS — N2581 Secondary hyperparathyroidism of renal origin: Secondary | ICD-10-CM | POA: Diagnosis not present

## 2018-10-20 DIAGNOSIS — D689 Coagulation defect, unspecified: Secondary | ICD-10-CM | POA: Diagnosis not present

## 2018-10-23 DIAGNOSIS — N2581 Secondary hyperparathyroidism of renal origin: Secondary | ICD-10-CM | POA: Diagnosis not present

## 2018-10-23 DIAGNOSIS — N186 End stage renal disease: Secondary | ICD-10-CM | POA: Diagnosis not present

## 2018-10-23 DIAGNOSIS — D689 Coagulation defect, unspecified: Secondary | ICD-10-CM | POA: Diagnosis not present

## 2018-10-24 DIAGNOSIS — M542 Cervicalgia: Secondary | ICD-10-CM | POA: Diagnosis not present

## 2018-10-25 DIAGNOSIS — D689 Coagulation defect, unspecified: Secondary | ICD-10-CM | POA: Diagnosis not present

## 2018-10-25 DIAGNOSIS — N2581 Secondary hyperparathyroidism of renal origin: Secondary | ICD-10-CM | POA: Diagnosis not present

## 2018-10-25 DIAGNOSIS — N186 End stage renal disease: Secondary | ICD-10-CM | POA: Diagnosis not present

## 2018-10-26 DIAGNOSIS — K6289 Other specified diseases of anus and rectum: Secondary | ICD-10-CM | POA: Diagnosis not present

## 2018-10-26 DIAGNOSIS — K600 Acute anal fissure: Secondary | ICD-10-CM | POA: Diagnosis not present

## 2018-10-27 ENCOUNTER — Other Ambulatory Visit: Payer: Self-pay

## 2018-10-27 ENCOUNTER — Emergency Department (HOSPITAL_COMMUNITY)
Admission: EM | Admit: 2018-10-27 | Discharge: 2018-10-27 | Disposition: A | Discharge status: Home / Self Care | Payer: Commercial Managed Care - PPO | Attending: Emergency Medicine | Admitting: Emergency Medicine

## 2018-10-27 DIAGNOSIS — Z992 Dependence on renal dialysis: Secondary | ICD-10-CM | POA: Insufficient documentation

## 2018-10-27 DIAGNOSIS — K6289 Other specified diseases of anus and rectum: Secondary | ICD-10-CM | POA: Insufficient documentation

## 2018-10-27 DIAGNOSIS — R110 Nausea: Secondary | ICD-10-CM | POA: Diagnosis not present

## 2018-10-27 DIAGNOSIS — Z79899 Other long term (current) drug therapy: Secondary | ICD-10-CM | POA: Insufficient documentation

## 2018-10-27 DIAGNOSIS — I120 Hypertensive chronic kidney disease with stage 5 chronic kidney disease or end stage renal disease: Secondary | ICD-10-CM | POA: Insufficient documentation

## 2018-10-27 DIAGNOSIS — I10 Essential (primary) hypertension: Secondary | ICD-10-CM | POA: Diagnosis not present

## 2018-10-27 DIAGNOSIS — D689 Coagulation defect, unspecified: Secondary | ICD-10-CM | POA: Diagnosis not present

## 2018-10-27 DIAGNOSIS — N186 End stage renal disease: Secondary | ICD-10-CM | POA: Diagnosis not present

## 2018-10-27 DIAGNOSIS — R252 Cramp and spasm: Secondary | ICD-10-CM | POA: Diagnosis present

## 2018-10-27 DIAGNOSIS — N2581 Secondary hyperparathyroidism of renal origin: Secondary | ICD-10-CM | POA: Diagnosis not present

## 2018-10-27 MED ORDER — HYDROMORPHONE HCL 1 MG/ML IJ SOLN
1.0000 mg | Freq: Once | INTRAMUSCULAR | Status: AC
  Administered 2018-10-27: 1 mg via INTRAMUSCULAR
  Filled 2018-10-27: qty 1

## 2018-10-27 MED ORDER — PROMETHAZINE HCL 25 MG PO TABS
25.0000 mg | ORAL_TABLET | Freq: Once | ORAL | Status: AC
Start: 2018-10-27 — End: 2018-10-27
  Administered 2018-10-27: 25 mg via ORAL
  Filled 2018-10-27: qty 1

## 2018-10-27 MED ORDER — LIDOCAINE 5 % EX OINT: 1.0000 "application " | TOPICAL_OINTMENT | Freq: Four times a day (QID) | CUTANEOUS | 0 refills | Status: DC | PRN

## 2018-10-27 MED ORDER — PSYLLIUM 58.6 % PO POWD: 1.0000 | Freq: Three times a day (TID) | ORAL | 12 refills | Status: DC

## 2018-10-27 MED ORDER — LIDOCAINE HCL URETHRAL/MUCOSAL 2 % EX GEL
1.0000 "application " | Freq: Once | CUTANEOUS | Status: AC
  Administered 2018-10-27: 1 via TOPICAL
  Filled 2018-10-27: qty 20

## 2018-10-27 MED ORDER — LORAZEPAM 1 MG PO TABS
1.0000 mg | ORAL_TABLET | Freq: Once | ORAL | Status: AC
  Administered 2018-10-27: 1 mg via ORAL
  Filled 2018-10-27: qty 1

## 2018-10-27 NOTE — ED Provider Notes (Signed)
[facility] EMERGENCY DEPARTMENT Provider Note   CSN: [id number] Arrival date & time: 10/27/18  2014     History   Chief Complaint Chief Complaint  Patient presents with  . Spasms  . Nausea    HPI Colin [name] is a 53 y.o. male.  HPI   66yM with rectal pain/spasm. Onset about 3d ago. Intermittent. Severe pain at times. No appreciable exacerbating or relieving factors. No fever or chills. No blood in stool. ESRD and able to complete dialysis today but extremely uncomfortable while in chair. Says he was just seen by his gastroenterologist and diagnosed with anal fissure. Prescribed diltiazem gel but hasn't noticed a significant difference with it.   Past Medical History:  Diagnosis Date  . Anemia    iron deficiency  . Arthritis    Gout  . CKD (chronic kidney disease) stage 3, GFR 30-59 ml/min (HCC)    Stage 4  . Gout   . History of chicken pox   . Hypertension   . Melanosis coli   . Mumps   . Pneumonia   . Ulcerative colitis [place], [place]    Patient Active Problem List   Diagnosis Date Noted  . Hyperprolactinemia ([place]) 01/17/2018  . Screening for malignant neoplasm of prostate 01/17/2018  . Gynecomastia 01/17/2018  . Ingrown toenail 06/21/2017  . Onychomycosis 06/21/2017  . ESRD (end stage renal disease) ([place]) 02/07/2017  . Morbid obesity ([place]) 02/07/2017  . Bilateral leg edema 10/02/2015  . Hyperlipidemia 10/02/2015  . Edema 02/09/2011  . Chronic renal insufficiency 02/09/2011  . ANEMIA, IRON DEFICIENCY 10/16/2007  . Essential hypertension 10/16/2007  . Ulcerative colitis ([place]) 02/13/2007  . MELANOSIS COLI 02/13/2007    Past Surgical History:  Procedure Laterality Date  . AV FISTULA PLACEMENT Right 08/27/2016   Procedure: RIGHT RADIOCEPHALIC ARTERIOVENOUS FISTULA CREATION;  Surgeon: [name], MD;  Location: [facility] OR;  Service: Vascular;  Laterality: Right;  . AV FISTULA PLACEMENT Right 10/21/2017   Procedure: ARTERIOVENOUS  (AV) FISTULA CREATION RIGHT ARM;  Surgeon: [name], MD;  Location: [facility];  Service: Vascular;  Laterality: Right;  . COLONOSCOPY    . FISTULA SUPERFICIALIZATION Right 11/12/2016   Procedure: FISTULA SUPERFICIALIZATION RIGHT FOREEARM;  Surgeon: [name], MD;  Location: [facility] OR;  Service: Vascular;  Laterality: Right;  . REVISON OF ARTERIOVENOUS FISTULA Right 12/23/2017   Procedure: CEPHALIC VEIN TRANSPOSITION ARTERIOVENOUS FISTULA RIGHT UPPER ARM;  Surgeon: [name], MD;  Location: [facility] OR;  Service: Vascular;  Laterality: Right;  . TENDON REPAIR  1987   Left index finger        Home Medications    Prior to Admission medications   Medication Sig Start Date End Date Taking? Authorizing Provider  amLODipine (NORVASC) 10 MG tablet Take 10 mg by mouth at bedtime.    Yes [provider]  atenolol (TENORMIN) 50 MG tablet Take 100 mg by mouth at bedtime. 09/04/17  Yes [provider]  ferrous sulfate 325 (65 FE) MG tablet Take 325 mg by mouth at bedtime.   Yes [provider]  HUMIRA PEN 40 MG/0.4ML PNKT Inject 40 mg into the muscle every 14 (fourteen) days.  11/22/17  Yes [provider]  HYDROcodone-acetaminophen (NORCO) 5-325 MG tablet Take 1 tablet by mouth every 6 (six) hours as needed for moderate pain. 12/23/17  Yes Dagoberto Ligas, PA-C  multivitamin (RENA-VIT) TABS tablet Take 1 tablet by mouth at bedtime. 07/24/18  Yes [provider]  nitroGLYCERIN (NITROGLYN) 2 % ointment Apply 0.5 inches topically 3 (three) times daily.   Yes [provider]  pravastatin (PRAVACHOL) 40 MG tablet Take 40 mg by mouth at bedtime.    Yes [provider]  sevelamer carbonate (RENVELA) 800 MG tablet Take 2,400 mg by mouth 3 (three) times daily with meals.    Yes [provider]  bromocriptine (PARLODEL) 2.5 MG tablet Take 1 tablet (2.5 mg total) by mouth at bedtime. Patient not taking: Reported on 10/27/2018 01/17/18   Renato Shin,  MD  cyclobenzaprine (FLEXERIL) 10 MG tablet Take 1 tablet (10 mg total) by mouth 2 (two) times daily as needed for muscle spasms. Patient not taking: Reported on 10/27/2018 08/15/18   Wieters, Hallie C, PA-C  diclofenac sodium (VOLTAREN) 1 % GEL Apply 2 g topically 4 (four) times daily. Patient not taking: Reported on 10/27/2018 08/15/18   Wieters, Hallie C, PA-C  hydrALAZINE (APRESOLINE) 50 MG tablet Take 1 tablet (50 mg total) by mouth at bedtime. Patient not taking: Reported on 10/27/2018 08/27/16   Virgina Jock A, PA-C  lidocaine (XYLOCAINE) 5 % ointment Apply 1 application topically 4 (four) times daily as needed. 10/27/18   Virgel Manifold, MD  psyllium (METAMUCIL SMOOTH TEXTURE) 58.6 % powder Take 1 packet by mouth 3 (three) times daily. 10/27/18   Virgel Manifold, MD  traMADol (ULTRAM) 50 MG tablet Take 1 tablet (50 mg total) by mouth every 12 (twelve) hours as needed. Patient not taking: Reported on 10/27/2018 08/15/18   Janith Lima, PA-C    Family History Family History  Problem Relation Age of Onset  . Hypertension Father   . Heart disease Father   . Renal Disease Father   . Hypertension Mother     Social History Social History   Tobacco Use  . Smoking status: Never Smoker  . Smokeless tobacco: Former Systems developer    Types: Chew  Substance Use Topics  . Alcohol use: Yes    Comment: rare  . Drug use: No     Allergies   Patient has no known allergies.   Review of Systems Review of Systems  All systems reviewed and negative, other than as noted in HPI.  Physical Exam Updated Vital Signs BP 117/78   Pulse 65   Temp 98.3 F (36.8 C) (Oral)   Resp 13   Ht 5' 5"  (1.651 m)   Wt 104.3 kg   SpO2 94%   BMI 38.27 kg/m   Physical Exam Vitals signs and nursing note reviewed.  Constitutional:      Appearance: He is well-developed.     Comments: Laying on side on stretcher. Appears uncomfortable, but not toxic.   HENT:     Head: Normocephalic and atraumatic.    Eyes:     General:        Right eye: No discharge.        Left eye: No discharge.     Conjunctiva/sclera: Conjunctivae normal.  Neck:     Musculoskeletal: Neck supple.  Cardiovascular:     Rate and Rhythm: Normal rate and regular rhythm.     Heart sounds: Normal heart sounds. No murmur. No friction rub. No gallop.   Pulmonary:     Effort: Pulmonary effort is normal. No respiratory distress.     Breath sounds: Normal breath sounds.  Abdominal:     General: There is no distension.     Palpations: Abdomen is soft.     Tenderness: There is no  abdominal tenderness.  Genitourinary:    Comments: I did not appreciate an anal fissure myself. No concerning lesions noted. Gently attempted DRE but pt did not tolerate so I stopped.  Musculoskeletal:        General: No tenderness.  Skin:    General: Skin is warm and dry.  Neurological:     Mental Status: He is alert.  Psychiatric:        Behavior: Behavior normal.        Thought Content: Thought content normal.      ED Treatments / Results  Labs (all labs ordered are listed, but only abnormal results are displayed) Labs Reviewed - No data to display  EKG EKG Interpretation  Date/Time:  Friday October 27 2018 20:16:15 EST Ventricular Rate:  63 PR Interval:    QRS Duration: 97 QT Interval:  466 QTC Calculation: 478 R Axis:   68 Text Interpretation:  Sinus rhythm Probable left ventricular hypertrophy Borderline prolonged QT interval Confirmed by Virgel Manifold [phone]) on 10/27/2018 8:34:40 PM   Radiology No results found.  Procedures Procedures (including critical care time)  Medications Ordered in ED Medications - No data to display   Initial Impression / Assessment and Plan / ED Course  I have reviewed the triage vital signs and the nursing notes.  Pertinent labs & imaging results that were available during my care of the patient were reviewed by me and considered in my medical decision making (see chart for  details).     52yM with rectal pain. Proctalgia fugax? Pt says diagnosed with anal fissure but I could not identify one myself. Regardless, will also try topical lidocaine. Stool bulking agents. Avoid prolonged sitting, straining, etc.   Final Clinical Impressions(s) / ED Diagnoses   Final diagnoses:  Rectal pain    ED Discharge Orders    None       Virgel Manifold, MD 11/05/18 [phone]

## 2018-10-27 NOTE — ED Triage Notes (Signed)
Per ems pt has been having muscle spasms and nausea x 3days. Was seen by pcp for muscle spasms and nausea. Today during dialysis nausea and muscle spasms were unbearable. Anti emetics not effective

## 2018-10-29 ENCOUNTER — Telehealth: Payer: Self-pay | Admitting: Physician Assistant

## 2018-10-29 DIAGNOSIS — K610 Anal abscess: Secondary | ICD-10-CM | POA: Diagnosis not present

## 2018-10-29 DIAGNOSIS — K602 Anal fissure, unspecified: Secondary | ICD-10-CM | POA: Diagnosis not present

## 2018-10-29 DIAGNOSIS — K6289 Other specified diseases of anus and rectum: Secondary | ICD-10-CM | POA: Diagnosis not present

## 2018-10-29 NOTE — Telephone Encounter (Signed)
Pt called because ongoing rectal pain, especially when sitting for HD sessions.  Pain such that it makes him nauseated.   Having soft BM's Dr Benson Norway gave him NTG gel to use last week for  (fissure?).  Did not, is not helping.  Got lidocaine gel from ED later, this is helping for an hour or so. Heat/warm compresses help. Has oxycodone at home, he is reluctant to use as it may cause constipation.  Advised he call Dr Benson Norway in AM, Monday to see if he wants pt to reurn to office or get imaging.   Gave pt option of revisit to ED but we agreed this is likely to be a very long wait and fruitless.    Azucena Freed PA-C

## 2018-10-30 ENCOUNTER — Ambulatory Visit
Admission: RE | Admit: 2018-10-30 | Discharge: 2018-10-30 | Disposition: A | Discharge status: Home / Self Care | Payer: Commercial Managed Care - PPO | Source: Ambulatory Visit | Attending: Gastroenterology | Admitting: Gastroenterology

## 2018-10-30 ENCOUNTER — Other Ambulatory Visit: Payer: Self-pay | Admitting: Gastroenterology

## 2018-10-30 DIAGNOSIS — N2581 Secondary hyperparathyroidism of renal origin: Secondary | ICD-10-CM | POA: Diagnosis not present

## 2018-10-30 DIAGNOSIS — N186 End stage renal disease: Secondary | ICD-10-CM | POA: Diagnosis not present

## 2018-10-30 DIAGNOSIS — D689 Coagulation defect, unspecified: Secondary | ICD-10-CM | POA: Diagnosis not present

## 2018-10-30 DIAGNOSIS — K610 Anal abscess: Secondary | ICD-10-CM | POA: Diagnosis not present

## 2018-10-30 MED ORDER — IOPAMIDOL (ISOVUE-300) INJECTION 61%
125.0000 mL | Freq: Once | INTRAVENOUS | Status: AC | PRN
  Administered 2018-10-30: 125 mL via INTRAVENOUS

## 2018-10-31 DIAGNOSIS — K610 Anal abscess: Secondary | ICD-10-CM | POA: Diagnosis not present

## 2018-11-01 DIAGNOSIS — N186 End stage renal disease: Secondary | ICD-10-CM | POA: Diagnosis not present

## 2018-11-01 DIAGNOSIS — D689 Coagulation defect, unspecified: Secondary | ICD-10-CM | POA: Diagnosis not present

## 2018-11-01 DIAGNOSIS — N2581 Secondary hyperparathyroidism of renal origin: Secondary | ICD-10-CM | POA: Diagnosis not present

## 2018-11-03 DIAGNOSIS — D689 Coagulation defect, unspecified: Secondary | ICD-10-CM | POA: Diagnosis not present

## 2018-11-03 DIAGNOSIS — N2581 Secondary hyperparathyroidism of renal origin: Secondary | ICD-10-CM | POA: Diagnosis not present

## 2018-11-03 DIAGNOSIS — N186 End stage renal disease: Secondary | ICD-10-CM | POA: Diagnosis not present

## 2018-11-06 DIAGNOSIS — N186 End stage renal disease: Secondary | ICD-10-CM | POA: Diagnosis not present

## 2018-11-06 DIAGNOSIS — D689 Coagulation defect, unspecified: Secondary | ICD-10-CM | POA: Diagnosis not present

## 2018-11-06 DIAGNOSIS — N2581 Secondary hyperparathyroidism of renal origin: Secondary | ICD-10-CM | POA: Diagnosis not present

## 2018-11-08 DIAGNOSIS — N2581 Secondary hyperparathyroidism of renal origin: Secondary | ICD-10-CM | POA: Diagnosis not present

## 2018-11-08 DIAGNOSIS — N186 End stage renal disease: Secondary | ICD-10-CM | POA: Diagnosis not present

## 2018-11-08 DIAGNOSIS — D689 Coagulation defect, unspecified: Secondary | ICD-10-CM | POA: Diagnosis not present

## 2018-11-10 DIAGNOSIS — N186 End stage renal disease: Secondary | ICD-10-CM | POA: Diagnosis not present

## 2018-11-10 DIAGNOSIS — D689 Coagulation defect, unspecified: Secondary | ICD-10-CM | POA: Diagnosis not present

## 2018-11-10 DIAGNOSIS — N2581 Secondary hyperparathyroidism of renal origin: Secondary | ICD-10-CM | POA: Diagnosis not present

## 2018-11-13 DIAGNOSIS — N2581 Secondary hyperparathyroidism of renal origin: Secondary | ICD-10-CM | POA: Diagnosis not present

## 2018-11-13 DIAGNOSIS — D689 Coagulation defect, unspecified: Secondary | ICD-10-CM | POA: Diagnosis not present

## 2018-11-13 DIAGNOSIS — N186 End stage renal disease: Secondary | ICD-10-CM | POA: Diagnosis not present

## 2018-11-15 DIAGNOSIS — N186 End stage renal disease: Secondary | ICD-10-CM | POA: Diagnosis not present

## 2018-11-15 DIAGNOSIS — N2581 Secondary hyperparathyroidism of renal origin: Secondary | ICD-10-CM | POA: Diagnosis not present

## 2018-11-15 DIAGNOSIS — D689 Coagulation defect, unspecified: Secondary | ICD-10-CM | POA: Diagnosis not present

## 2018-11-17 DIAGNOSIS — D689 Coagulation defect, unspecified: Secondary | ICD-10-CM | POA: Diagnosis not present

## 2018-11-17 DIAGNOSIS — I129 Hypertensive chronic kidney disease with stage 1 through stage 4 chronic kidney disease, or unspecified chronic kidney disease: Secondary | ICD-10-CM | POA: Diagnosis not present

## 2018-11-17 DIAGNOSIS — N2581 Secondary hyperparathyroidism of renal origin: Secondary | ICD-10-CM | POA: Diagnosis not present

## 2018-11-17 DIAGNOSIS — N186 End stage renal disease: Secondary | ICD-10-CM | POA: Diagnosis not present

## 2018-11-17 DIAGNOSIS — Z992 Dependence on renal dialysis: Secondary | ICD-10-CM | POA: Diagnosis not present

## 2018-11-20 DIAGNOSIS — N2581 Secondary hyperparathyroidism of renal origin: Secondary | ICD-10-CM | POA: Diagnosis not present

## 2018-11-20 DIAGNOSIS — D689 Coagulation defect, unspecified: Secondary | ICD-10-CM | POA: Diagnosis not present

## 2018-11-20 DIAGNOSIS — N186 End stage renal disease: Secondary | ICD-10-CM | POA: Diagnosis not present

## 2018-11-22 DIAGNOSIS — N2581 Secondary hyperparathyroidism of renal origin: Secondary | ICD-10-CM | POA: Diagnosis not present

## 2018-11-22 DIAGNOSIS — N186 End stage renal disease: Secondary | ICD-10-CM | POA: Diagnosis not present

## 2018-11-22 DIAGNOSIS — D689 Coagulation defect, unspecified: Secondary | ICD-10-CM | POA: Diagnosis not present

## 2018-11-24 DIAGNOSIS — N2581 Secondary hyperparathyroidism of renal origin: Secondary | ICD-10-CM | POA: Diagnosis not present

## 2018-11-24 DIAGNOSIS — N186 End stage renal disease: Secondary | ICD-10-CM | POA: Diagnosis not present

## 2018-11-24 DIAGNOSIS — D689 Coagulation defect, unspecified: Secondary | ICD-10-CM | POA: Diagnosis not present

## 2018-11-27 DIAGNOSIS — N186 End stage renal disease: Secondary | ICD-10-CM | POA: Diagnosis not present

## 2018-11-27 DIAGNOSIS — D689 Coagulation defect, unspecified: Secondary | ICD-10-CM | POA: Diagnosis not present

## 2018-11-27 DIAGNOSIS — N2581 Secondary hyperparathyroidism of renal origin: Secondary | ICD-10-CM | POA: Diagnosis not present

## 2018-11-29 DIAGNOSIS — N186 End stage renal disease: Secondary | ICD-10-CM | POA: Diagnosis not present

## 2018-11-29 DIAGNOSIS — D689 Coagulation defect, unspecified: Secondary | ICD-10-CM | POA: Diagnosis not present

## 2018-11-29 DIAGNOSIS — N2581 Secondary hyperparathyroidism of renal origin: Secondary | ICD-10-CM | POA: Diagnosis not present

## 2018-11-30 DIAGNOSIS — N186 End stage renal disease: Secondary | ICD-10-CM | POA: Diagnosis not present

## 2018-11-30 DIAGNOSIS — Z7682 Awaiting organ transplant status: Secondary | ICD-10-CM | POA: Diagnosis not present

## 2018-11-30 DIAGNOSIS — Z992 Dependence on renal dialysis: Secondary | ICD-10-CM | POA: Diagnosis not present

## 2018-11-30 DIAGNOSIS — I132 Hypertensive heart and chronic kidney disease with heart failure and with stage 5 chronic kidney disease, or end stage renal disease: Secondary | ICD-10-CM | POA: Diagnosis not present

## 2018-11-30 DIAGNOSIS — Z01818 Encounter for other preprocedural examination: Secondary | ICD-10-CM | POA: Diagnosis not present

## 2018-12-01 DIAGNOSIS — D689 Coagulation defect, unspecified: Secondary | ICD-10-CM | POA: Diagnosis not present

## 2018-12-01 DIAGNOSIS — N186 End stage renal disease: Secondary | ICD-10-CM | POA: Diagnosis not present

## 2018-12-01 DIAGNOSIS — N2581 Secondary hyperparathyroidism of renal origin: Secondary | ICD-10-CM | POA: Diagnosis not present

## 2018-12-04 DIAGNOSIS — D689 Coagulation defect, unspecified: Secondary | ICD-10-CM | POA: Diagnosis not present

## 2018-12-04 DIAGNOSIS — N186 End stage renal disease: Secondary | ICD-10-CM | POA: Diagnosis not present

## 2018-12-04 DIAGNOSIS — N2581 Secondary hyperparathyroidism of renal origin: Secondary | ICD-10-CM | POA: Diagnosis not present

## 2018-12-06 DIAGNOSIS — D689 Coagulation defect, unspecified: Secondary | ICD-10-CM | POA: Diagnosis not present

## 2018-12-06 DIAGNOSIS — N186 End stage renal disease: Secondary | ICD-10-CM | POA: Diagnosis not present

## 2018-12-06 DIAGNOSIS — N2581 Secondary hyperparathyroidism of renal origin: Secondary | ICD-10-CM | POA: Diagnosis not present

## 2018-12-08 DIAGNOSIS — N186 End stage renal disease: Secondary | ICD-10-CM | POA: Diagnosis not present

## 2018-12-08 DIAGNOSIS — N2581 Secondary hyperparathyroidism of renal origin: Secondary | ICD-10-CM | POA: Diagnosis not present

## 2018-12-08 DIAGNOSIS — D689 Coagulation defect, unspecified: Secondary | ICD-10-CM | POA: Diagnosis not present

## 2018-12-11 DIAGNOSIS — D689 Coagulation defect, unspecified: Secondary | ICD-10-CM | POA: Diagnosis not present

## 2018-12-11 DIAGNOSIS — N186 End stage renal disease: Secondary | ICD-10-CM | POA: Diagnosis not present

## 2018-12-11 DIAGNOSIS — N2581 Secondary hyperparathyroidism of renal origin: Secondary | ICD-10-CM | POA: Diagnosis not present

## 2018-12-13 DIAGNOSIS — K5190 Ulcerative colitis, unspecified, without complications: Secondary | ICD-10-CM | POA: Diagnosis not present

## 2018-12-13 DIAGNOSIS — N186 End stage renal disease: Secondary | ICD-10-CM | POA: Diagnosis not present

## 2018-12-13 DIAGNOSIS — N2581 Secondary hyperparathyroidism of renal origin: Secondary | ICD-10-CM | POA: Diagnosis not present

## 2018-12-13 DIAGNOSIS — D689 Coagulation defect, unspecified: Secondary | ICD-10-CM | POA: Diagnosis not present

## 2018-12-15 DIAGNOSIS — N2581 Secondary hyperparathyroidism of renal origin: Secondary | ICD-10-CM | POA: Diagnosis not present

## 2018-12-15 DIAGNOSIS — D689 Coagulation defect, unspecified: Secondary | ICD-10-CM | POA: Diagnosis not present

## 2018-12-15 DIAGNOSIS — N186 End stage renal disease: Secondary | ICD-10-CM | POA: Diagnosis not present

## 2018-12-16 DIAGNOSIS — N186 End stage renal disease: Secondary | ICD-10-CM | POA: Diagnosis not present

## 2018-12-16 DIAGNOSIS — Z992 Dependence on renal dialysis: Secondary | ICD-10-CM | POA: Diagnosis not present

## 2018-12-16 DIAGNOSIS — I129 Hypertensive chronic kidney disease with stage 1 through stage 4 chronic kidney disease, or unspecified chronic kidney disease: Secondary | ICD-10-CM | POA: Diagnosis not present

## 2018-12-18 DIAGNOSIS — N2581 Secondary hyperparathyroidism of renal origin: Secondary | ICD-10-CM | POA: Diagnosis not present

## 2018-12-18 DIAGNOSIS — N186 End stage renal disease: Secondary | ICD-10-CM | POA: Diagnosis not present

## 2018-12-18 DIAGNOSIS — D689 Coagulation defect, unspecified: Secondary | ICD-10-CM | POA: Diagnosis not present

## 2018-12-20 DIAGNOSIS — D689 Coagulation defect, unspecified: Secondary | ICD-10-CM | POA: Diagnosis not present

## 2018-12-20 DIAGNOSIS — N2581 Secondary hyperparathyroidism of renal origin: Secondary | ICD-10-CM | POA: Diagnosis not present

## 2018-12-20 DIAGNOSIS — N186 End stage renal disease: Secondary | ICD-10-CM | POA: Diagnosis not present

## 2018-12-22 DIAGNOSIS — N2581 Secondary hyperparathyroidism of renal origin: Secondary | ICD-10-CM | POA: Diagnosis not present

## 2018-12-22 DIAGNOSIS — D689 Coagulation defect, unspecified: Secondary | ICD-10-CM | POA: Diagnosis not present

## 2018-12-22 DIAGNOSIS — N186 End stage renal disease: Secondary | ICD-10-CM | POA: Diagnosis not present

## 2018-12-25 DIAGNOSIS — D689 Coagulation defect, unspecified: Secondary | ICD-10-CM | POA: Diagnosis not present

## 2018-12-25 DIAGNOSIS — N2581 Secondary hyperparathyroidism of renal origin: Secondary | ICD-10-CM | POA: Diagnosis not present

## 2018-12-25 DIAGNOSIS — N186 End stage renal disease: Secondary | ICD-10-CM | POA: Diagnosis not present

## 2018-12-27 DIAGNOSIS — D689 Coagulation defect, unspecified: Secondary | ICD-10-CM | POA: Diagnosis not present

## 2018-12-27 DIAGNOSIS — N2581 Secondary hyperparathyroidism of renal origin: Secondary | ICD-10-CM | POA: Diagnosis not present

## 2018-12-27 DIAGNOSIS — N186 End stage renal disease: Secondary | ICD-10-CM | POA: Diagnosis not present

## 2018-12-28 DIAGNOSIS — I871 Compression of vein: Secondary | ICD-10-CM | POA: Diagnosis not present

## 2018-12-28 DIAGNOSIS — T82858A Stenosis of vascular prosthetic devices, implants and grafts, initial encounter: Secondary | ICD-10-CM | POA: Diagnosis not present

## 2018-12-28 DIAGNOSIS — N186 End stage renal disease: Secondary | ICD-10-CM | POA: Diagnosis not present

## 2018-12-29 DIAGNOSIS — N186 End stage renal disease: Secondary | ICD-10-CM | POA: Diagnosis not present

## 2018-12-29 DIAGNOSIS — D689 Coagulation defect, unspecified: Secondary | ICD-10-CM | POA: Diagnosis not present

## 2018-12-29 DIAGNOSIS — N2581 Secondary hyperparathyroidism of renal origin: Secondary | ICD-10-CM | POA: Diagnosis not present

## 2019-01-01 DIAGNOSIS — D689 Coagulation defect, unspecified: Secondary | ICD-10-CM | POA: Diagnosis not present

## 2019-01-01 DIAGNOSIS — N2581 Secondary hyperparathyroidism of renal origin: Secondary | ICD-10-CM | POA: Diagnosis not present

## 2019-01-01 DIAGNOSIS — N186 End stage renal disease: Secondary | ICD-10-CM | POA: Diagnosis not present

## 2019-01-03 DIAGNOSIS — N186 End stage renal disease: Secondary | ICD-10-CM | POA: Diagnosis not present

## 2019-01-03 DIAGNOSIS — N2581 Secondary hyperparathyroidism of renal origin: Secondary | ICD-10-CM | POA: Diagnosis not present

## 2019-01-03 DIAGNOSIS — D689 Coagulation defect, unspecified: Secondary | ICD-10-CM | POA: Diagnosis not present

## 2019-01-05 ENCOUNTER — Telehealth: Payer: Self-pay | Admitting: Cardiovascular Disease

## 2019-01-05 DIAGNOSIS — N186 End stage renal disease: Secondary | ICD-10-CM | POA: Diagnosis not present

## 2019-01-05 DIAGNOSIS — N2581 Secondary hyperparathyroidism of renal origin: Secondary | ICD-10-CM | POA: Diagnosis not present

## 2019-01-05 DIAGNOSIS — D689 Coagulation defect, unspecified: Secondary | ICD-10-CM | POA: Diagnosis not present

## 2019-01-05 NOTE — Telephone Encounter (Signed)
Left voicemail message for patient to call back for prescreening and appointment information.

## 2019-01-08 DIAGNOSIS — N2581 Secondary hyperparathyroidism of renal origin: Secondary | ICD-10-CM | POA: Diagnosis not present

## 2019-01-08 DIAGNOSIS — N186 End stage renal disease: Secondary | ICD-10-CM | POA: Diagnosis not present

## 2019-01-08 DIAGNOSIS — D689 Coagulation defect, unspecified: Secondary | ICD-10-CM | POA: Diagnosis not present

## 2019-01-08 NOTE — Telephone Encounter (Signed)
   Cardiac Questionnaire:    Since your last visit or hospitalization:    1. Have you been having new or worsening chest pain? No   2. Have you been having new or worsening shortness of breath? No 3. Have you been having new or worsening leg swelling, wt gain, or increase in abdominal girth (pants fitting more tightly)? No   4. Have you had any passing out spells? No     The patient is currently awaiting cardiac clearance for further work up for a kidney transplant.  _____________   COVID-19 Pre-Screening Questions:  . Do you currently have a fever? No (yes = cancel and refer to pcp for e-visit) . Have you recently travelled on a cruise, internationally, or to [place], [place], [place], [place], [place], or [place], [place] [employer]) ? No (yes = cancel, stay home, monitor symptoms, and contact pcp or initiate e-visit if symptoms develop) . Have you been in contact with someone that is currently pending confirmation of Covid19 testing or has been confirmed to have the [place] virus?  No  (yes = cancel, stay home, away from tested individual, monitor symptoms, and contact pcp or initiate e-visit if symptoms develop) . Are you currently experiencing fatigue or cough? No (yes = pt should be prepared to have a mask placed at the time of their visit).  The patient has been made aware to enter through the [place] entrance of the hospital for further screening and a temperature check.  He is aware he will be directed to our office from that point.  The patient voices understanding and is agreeable.

## 2019-01-08 NOTE — Telephone Encounter (Signed)
Patient returning call.  Please call work Number

## 2019-01-09 ENCOUNTER — Telehealth: Payer: Self-pay | Admitting: Cardiovascular Disease

## 2019-01-09 ENCOUNTER — Telehealth (INDEPENDENT_AMBULATORY_CARE_PROVIDER_SITE_OTHER): Payer: Commercial Managed Care - PPO | Admitting: Cardiovascular Disease

## 2019-01-09 ENCOUNTER — Other Ambulatory Visit: Payer: Self-pay

## 2019-01-09 DIAGNOSIS — I10 Essential (primary) hypertension: Secondary | ICD-10-CM

## 2019-01-09 DIAGNOSIS — Z01810 Encounter for preprocedural cardiovascular examination: Secondary | ICD-10-CM

## 2019-01-09 DIAGNOSIS — N186 End stage renal disease: Secondary | ICD-10-CM

## 2019-01-09 DIAGNOSIS — I120 Hypertensive chronic kidney disease with stage 5 chronic kidney disease or end stage renal disease: Secondary | ICD-10-CM

## 2019-01-09 DIAGNOSIS — R600 Localized edema: Secondary | ICD-10-CM

## 2019-01-09 DIAGNOSIS — Z6841 Body Mass Index (BMI) 40.0 and over, adult: Secondary | ICD-10-CM

## 2019-01-09 NOTE — Patient Instructions (Addendum)
Medication Instructions:  No changes  If you need a refill on your cardiac medications before your next appointment, please call your pharmacy.    Lab work: No new labs needed   If you have labs (blood work) drawn today and your tests are completely normal, you will receive your results only by: Marland Kitchen MyChart Message (if you have MyChart) OR . A paper copy in the mail If you have any lab test that is abnormal or we need to change your treatment, we will call you to review the results.   Testing/Procedures: We will set up an echocardiogram  Your physician has requested that you have an echocardiogram. Echocardiography is a painless test that uses sound waves to create images of your heart. It provides your doctor with information about the size and shape of your heart and how well your heart's chambers and valves are working. This procedure takes approximately one hour. There are no restrictions for this procedure.   Stress test: Lexiscan myoview : preop  [place]  Your caregiver has ordered a Stress Test with nuclear imaging. The purpose of this test is to evaluate the blood supply to your heart muscle. This procedure is referred to as a "Non-Invasive Stress Test." This is because other than having an IV started in your vein, nothing is inserted or "invades" your body. Cardiac stress tests are done to find areas of poor blood flow to the heart by determining the extent of coronary artery disease (CAD). Some patients exercise on a treadmill, which naturally increases the blood flow to your heart, while others who are  unable to walk on a treadmill due to physical limitations have a pharmacologic/chemical stress agent called Lexiscan . This medicine will mimic walking on a treadmill by temporarily increasing your coronary blood flow.   Please note: these test may take anywhere between 2-4 hours to complete  PLEASE REPORT TO [place] AT THE FIRST DESK WILL  DIRECT YOU WHERE TO GO  Date of Procedure:_____________________________________  Arrival Time for Procedure:______________________________  Instructions regarding medication:   __XX__:  Hold Amlodipine and Atenolol the night before procedure and morning of procedure   PLEASE NOTIFY THE OFFICE AT LEAST 24 HOURS IN ADVANCE IF YOU ARE UNABLE TO KEEP YOUR APPOINTMENT.  [phone] AND  PLEASE NOTIFY NUCLEAR MEDICINE AT [facility] AT LEAST 24 HOURS IN ADVANCE IF YOU ARE UNABLE TO KEEP YOUR APPOINTMENT. [phone]  How to prepare for your Myoview test:  1. Do not eat or drink after midnight 2. No caffeine for 24 hours prior to test 3. No smoking 24 hours prior to test. 4. Your medication may be taken with water.  If your doctor stopped a medication because of this test, do not take that medication. 5. Ladies, please do not wear dresses.  Skirts or pants are appropriate. Please wear a short sleeve shirt. 6. No perfume, cologne or lotion. 7. Wear comfortable walking shoes. No heels!    Follow-Up: At [facility], you and your health needs are our priority.  As part of our continuing mission to provide you with exceptional heart care, we have created designated Provider Care Teams.  These Care Teams include your primary Cardiologist (physician) and Advanced Practice Providers (APPs -  Physician Assistants and Nurse Practitioners) who all work together to provide you with the care you need, when you need it.  . You will need a follow up appointment in 12 months .   Please call our office  2 months in advance to schedule this appointment.    . Providers on your designated Care Team:   . Murray Hodgkins, NP . Christell Faith, PA-C . Marrianne Mood, PA-C  Any Other Special Instructions Will Be Listed Below (If Applicable).  For educational health videos Log in to : [email or web address] Or : [email or web address], password : [email or web address]   Echocardiogram An echocardiogram is a procedure that uses painless  sound waves (ultrasound) to produce an image of the heart. Images from an echocardiogram can provide important information about:  Signs of coronary artery disease (CAD).  Aneurysm detection. An aneurysm is a weak or damaged part of an artery wall that bulges out from the normal force of blood pumping through the body.  Heart size and shape. Changes in the size or shape of the heart can be associated with certain conditions, including heart failure, aneurysm, and CAD.  Heart muscle function.  Heart valve function.  Signs of a past heart attack.  Fluid buildup around the heart.  Thickening of the heart muscle.  A tumor or infectious growth around the heart valves. Tell a health care provider about:  Any allergies you have.  All medicines you are taking, including vitamins, herbs, eye drops, creams, and over-the-counter medicines.  Any blood disorders you have.  Any surgeries you have had.  Any medical conditions you have.  Whether you are pregnant or may be pregnant. What are the risks? Generally, this is a safe procedure. However, problems may occur, including:  Allergic reaction to dye (contrast) that may be used during the procedure. What happens before the procedure? No specific preparation is needed. You may eat and drink normally. What happens during the procedure?   An IV tube may be inserted into one of your veins.  You may receive contrast through this tube. A contrast is an injection that improves the quality of the pictures from your heart.  A gel will be applied to your chest.  A wand-like tool (transducer) will be moved over your chest. The gel will help to transmit the sound waves from the transducer.  The sound waves will harmlessly bounce off of your heart to allow the heart images to be captured in real-time motion. The images will be recorded on a computer. The procedure may vary among health care providers and hospitals. What happens after the  procedure?  You may return to your normal, everyday life, including diet, activities, and medicines, unless your health care provider tells you not to do that. Summary  An echocardiogram is a procedure that uses painless sound waves (ultrasound) to produce an image of the heart.  Images from an echocardiogram can provide important information about the size and shape of your heart, heart muscle function, heart valve function, and fluid buildup around your heart.  You do not need to do anything to prepare before this procedure. You may eat and drink normally.  After the echocardiogram is completed, you may return to your normal, everyday life, unless your health care provider tells you not to do that. This information is not intended to replace advice given to you by your health care provider. Make sure you discuss any questions you have with your health care provider. Document Released: 10/01/2000 Document Revised: 11/06/2016 Document Reviewed: 11/06/2016 Elsevier Interactive Patient Education  2019 [employer].  Cardiac Nuclear Scan A cardiac nuclear scan is a test that is done to check the flow of blood to your heart. It is done when you are resting and when  you are exercising. The test looks for problems such as:  Not enough blood reaching a portion of the heart.  The heart muscle not working as it should. You may need this test if:  You have heart disease.  You have had lab results that are not normal.  You have had heart surgery or a balloon procedure to open up blocked arteries (angioplasty).  You have chest pain.  You have shortness of breath. In this test, a special dye (tracer) is put into your bloodstream. The tracer will travel to your heart. A camera will then take pictures of your heart to see how the tracer moves through your heart. This test is usually done at a hospital and takes 2-4 hours. Tell a doctor about:  Any allergies you have.  All medicines you are  taking, including vitamins, herbs, eye drops, creams, and over-the-counter medicines.  Any problems you or family members have had with anesthetic medicines.  Any blood disorders you have.  Any surgeries you have had.  Any medical conditions you have.  Whether you are pregnant or may be pregnant. What are the risks? Generally, this is a safe test. However, problems may occur, such as:  Serious chest pain and heart attack. This is only a risk if the stress portion of the test is done.  Rapid heartbeat.  A feeling of warmth in your chest. This feeling usually does not last long.  Allergic reaction to the tracer. What happens before the test?  Ask your doctor about changing or stopping your normal medicines. This is important.  Follow instructions from your doctor about what you cannot eat or drink.  Remove your jewelry on the day of the test. What happens during the test?  An IV tube will be inserted into one of your veins.  Your doctor will give you a small amount of tracer through the IV tube.  You will wait for 20-40 minutes while the tracer moves through your bloodstream.  Your heart will be monitored with an electrocardiogram (ECG).  You will lie down on an exam table.  Pictures of your heart will be taken for about 15-20 minutes.  You may also have a stress test. For this test, one of these things may be done: ? You will be asked to exercise on a treadmill or a stationary bike. ? You will be given medicines that will make your heart work harder. This is done if you are unable to exercise.  When blood flow to your heart has peaked, a tracer will again be given through the IV tube.  After 20-40 minutes, you will get back on the exam table. More pictures will be taken of your heart.  Depending on the tracer that is used, more pictures may need to be taken 3-4 hours later.  Your IV tube will be removed when the test is over. The test may vary among doctors and  hospitals. What happens after the test?  Ask your doctor: ? Whether you can return to your normal schedule, including diet, activities, and medicines. ? Whether you should drink more fluids. This will help to remove the tracer from your body. Drink enough fluid to keep your pee (urine) pale yellow.  Ask your doctor, or the department that is doing the test: ? When will my results be ready? ? How will I get my results? Summary  A cardiac nuclear scan is a test that is done to check the flow of blood to your heart.  Tell your doctor whether you are pregnant or may be pregnant.  Before the test, ask your doctor about changing or stopping your normal medicines. This is important.  Ask your doctor whether you can return to your normal activities. You may be asked to drink more fluids. This information is not intended to replace advice given to you by your health care provider. Make sure you discuss any questions you have with your health care provider. Document Released: 03/20/2018 Document Revised: 03/20/2018 Document Reviewed: 03/20/2018 Elsevier Interactive Patient Education  2019 [employer].

## 2019-01-09 NOTE — Telephone Encounter (Signed)
Spoke with patient and reviewed his medications and testing that was ordered. Reviewed all instructions for stress testing and instructed him to hold atenolol and amlodipine the night before and the morning of his testing. Advised that I would have scheduling to call and assist with getting these done. He was appreciative for the call with no further questions at this time.

## 2019-01-09 NOTE — Progress Notes (Signed)
Telephone Visit     Evaluation Performed:  Follow-up visit  This visit type was conducted due to national recommendations for restrictions regarding the COVID-19 Pandemic (e.g. social distancing).  This format is felt to be most appropriate for this patient at this time.  All issues noted in this document were discussed and addressed.  No physical exam was performed (except for noted visual exam findings with Telehealth visits).  See MyChart message from today for the patient's consent to telehealth for [facility].  Date:  01/09/2019   ID:  Colin [name], DOB Apr 21, 1966, MRN [id number]  Patient Location:  [place] [address]   Provider location:   [facility], [place] office  PCP:  Gaynelle Arabian, MD  Cardiologist:  No primary care provider on file.   Chief Complaint:  Pre kidney transplant   History of Present Illness:    Colin [name] is a 53 y.o. male who presents via audio/video conferencing for a telehealth visit today.   The patient does not symptoms concerning for COVID-19 infection (fever, chills, cough, or new SHORTNESS OF BREATH).  Please refer to prior office visit for complete details:  Patient has a past medical history of hypertension,  stage IV kidney disease,attributed to systemic hypertension , diagnosis of focal segmental glomerulosclerosis AV fistula, for HD ulcerative colitis dating back to 2000 on Humira   Chronic leg edema Echo normal in 01/2014 Discussion  today concerning his hypertension and leg swelling  Notes indicating he is awaiting kidney transplant at [facility]  main obstacle  will be his weight as his body mass index is slightly above the cutoff of 40  Recent CT scan January 2020 showing mild atherosclerosis in the proximal left common iliac vessel Otherwise relatively clean distal aorta and iliac, femoral artery vessels  Able to walk and move around Denies any symptoms concerning for angina,   Reports stable  lower extremity edema, "not bad"  Reports that he needs echocardiogram and Myoview to be listed for transplant    Prior CV studies:   The following studies were reviewed today:  Echocardiogram April 2015 ejection fraction 55 to 60%  Past Medical History:  Diagnosis Date  . Anemia    iron deficiency  . Arthritis    Gout  . CKD (chronic kidney disease) stage 3, GFR 30-59 ml/min (HCC)    Stage 4  . Gout   . History of chicken pox   . Hypertension   . Melanosis coli   . Mumps   . Pneumonia   . Ulcerative colitis [place], [place]   Past Surgical History:  Procedure Laterality Date  . AV FISTULA PLACEMENT Right 08/27/2016   Procedure: RIGHT RADIOCEPHALIC ARTERIOVENOUS FISTULA CREATION;  Surgeon: [name], MD;  Location: [facility] OR;  Service: Vascular;  Laterality: Right;  . AV FISTULA PLACEMENT Right 10/21/2017   Procedure: ARTERIOVENOUS (AV) FISTULA CREATION RIGHT ARM;  Surgeon: [name], MD;  Location: [facility];  Service: Vascular;  Laterality: Right;  . COLONOSCOPY    . FISTULA SUPERFICIALIZATION Right 11/12/2016   Procedure: FISTULA SUPERFICIALIZATION RIGHT FOREEARM;  Surgeon: [name], MD;  Location: [facility] OR;  Service: Vascular;  Laterality: Right;  . REVISON OF ARTERIOVENOUS FISTULA Right 12/23/2017   Procedure: CEPHALIC VEIN TRANSPOSITION ARTERIOVENOUS FISTULA RIGHT UPPER ARM;  Surgeon: [name], MD;  Location: [facility] OR;  Service: Vascular;  Laterality: Right;  . TENDON REPAIR  1987   Left index finger  No outpatient medications have been marked as taking for the 01/09/19 encounter (Telemedicine) with Minna Merritts, MD.     Allergies:   Patient has no known allergies.   Social History   Tobacco Use  . Smoking status: Never Smoker  . Smokeless tobacco: Former Systems developer    Types: Chew  Substance Use Topics  . Alcohol use: Yes    Comment: rare  . Drug use: No     Family Hx: The patient's family history includes Heart disease in his father;  Hypertension in his father and mother; Renal Disease in his father.  ROS:   Please see the history of present illness.    Review of Systems  Constitutional: Negative.   Respiratory: Negative.   Cardiovascular: Negative.   Gastrointestinal: Negative.   Musculoskeletal: Negative.   Neurological: Negative.   Psychiatric/Behavioral: Negative.   All other systems reviewed and are negative.    Labs/Other Tests and Data Reviewed:    Recent Labs: No results found for requested labs within last 8760 hours.   Recent Lipid Panel No results found for: CHOL, TRIG, HDL, CHOLHDL, LDLCALC, LDLDIRECT  Wt Readings from Last 3 Encounters:  10/27/18 230 lb (104.3 kg)  01/24/18 247 lb (112 kg)  01/17/18 249 lb 3.2 oz (113 kg)     Exam:    Vital Signs:  There were no vitals taken for this visit.   Well nourished, well developed male in no acute distress.   ASSESSMENT & PLAN:    1. Morbid obesity ([place]) We have encouraged continued exercise, careful diet management in an effort to lose weight. He is trying to cut out all carbohydrates, sweet tea soda and effort to get his weight down  2. ESRD (end stage renal disease) ([place]) Notes reviewed from nephrology Being evaluated for transplant listing We will order echocardiogram and ischemic work-up at his request (prekidney transplant)  3. Essential hypertension Blood pressure is well controlled on today's visit. No changes made to the medications.  4. Localized edema Currently does not feel this is a significant issue Does not feel he is fluid overloaded, no changes to his medications  5.  Preop cardiovascular evaluation Echocardiogram and pharmacological Myoview scheduled at patient request for transplant listing    COVID-19 Education: The signs and symptoms of COVID-19 were discussed with the patient and how to seek care for testing (follow up with PCP or arrange E-visit).  The importance of social distancing was discussed  today.  Patient Risk:   After full review of this patients clinical status, I feel that they are at least moderate risk at this time.  Time:   Today, I have spent 25 minutes with the patient with telehealth technology discussing .     Medication Adjustments/Labs and Tests Ordered: Current medicines are reviewed at length with the patient today.  Concerns regarding medicines are outlined above.   Tests Ordered: Echocardiogram and pharmacologic Myoview ordered   Medication Changes: No changes made   Disposition: Follow-up in 6 months   Signed, Ida Rogue, MD  01/09/2019 1:33 PM    [place] [facility] [address] [place] [address], [place], [place] [address]

## 2019-01-09 NOTE — Telephone Encounter (Signed)
TELEPHONE CALL NOTE  Colin [name] has been deemed a candidate for a follow-up tele-health visit to limit community exposure during the Covid-19 pandemic. I spoke with the patient via phone to ensure availability of phone/video source, confirm preferred email & phone number, discuss instructions and expectations, and review consent.   I reminded Colin [name] to be prepared with any vital sign and/or heart rhythm information that could potentially be obtained via home monitoring, at the time of his visit.  Finally, I reminded Colin [name] to expect an e-mail containing a link for their video-based visit approximately 15 minutes before his visit, or alternatively, a phone call at the time of his visit if his visit is planned to be a phone encounter.   Ace Gins 01/09/2019 9:26 AM  CONSENT FOR TELE-HEALTH VISIT - PLEASE REVIEW  I hereby voluntarily request, consent and authorize [facility] and its employed or contracted physicians, physician assistants, nurse practitioners or other licensed health care professionals (the Practitioner), to provide me with telemedicine health care services (the Services") as deemed necessary by the treating Practitioner. I acknowledge and consent to receive the Services by the Practitioner via telemedicine. I understand that the telemedicine visit will involve communicating with the Practitioner through live audiovisual communication technology and the disclosure of certain medical information by electronic transmission. I acknowledge that I have been given the opportunity to request an in-person assessment or other available alternative prior to the telemedicine visit and am voluntarily participating in the telemedicine visit.  I understand that I have the right to withhold or withdraw my consent to the use of telemedicine in the course of my care at any time, without affecting my right to future care or treatment, and that the Practitioner or I may terminate the  telemedicine visit at any time. I understand that I have the right to inspect all information obtained and/or recorded in the course of the telemedicine visit and may receive copies of available information for a reasonable fee.  I understand that some of the potential risks of receiving the Services via telemedicine include:   Delay or interruption in medical evaluation due to technological equipment failure or disruption;  Information transmitted may not be sufficient (e.g. poor resolution of images) to allow for appropriate medical decision making by the Practitioner; and/or   In rare instances, security protocols could fail, causing a breach of personal health information.  Furthermore, I acknowledge that it is my responsibility to provide information about my medical history, conditions and care that is complete and accurate to the best of my ability. I acknowledge that Practitioner's advice, recommendations, and/or decision may be based on factors not within their control, such as incomplete or inaccurate data provided by me or distortions of diagnostic images or specimens that may result from electronic transmissions. I understand that the practice of medicine is not an exact science and that Practitioner makes no warranties or guarantees regarding treatment outcomes. I acknowledge that I will receive a copy of this consent concurrently upon execution via email to the email address I last provided but may also request a printed copy by calling the office of [place].    I understand that my insurance will be billed for this visit.   I have read or had this consent read to me.  I understand the contents of this consent, which adequately explains the benefits and risks of the Services being provided via telemedicine.   I have been provided ample opportunity to ask questions regarding  this consent and the Services and have had my questions answered to my satisfaction.  I give my informed  consent for the services to be provided through the use of telemedicine in my medical care  By participating in this telemedicine visit I agree to the above.

## 2019-01-10 DIAGNOSIS — D689 Coagulation defect, unspecified: Secondary | ICD-10-CM | POA: Diagnosis not present

## 2019-01-10 DIAGNOSIS — N2581 Secondary hyperparathyroidism of renal origin: Secondary | ICD-10-CM | POA: Diagnosis not present

## 2019-01-10 DIAGNOSIS — N186 End stage renal disease: Secondary | ICD-10-CM | POA: Diagnosis not present

## 2019-01-10 NOTE — Telephone Encounter (Signed)
Scheduled Echo / Lexi / Entered Recall.

## 2019-01-11 ENCOUNTER — Other Ambulatory Visit: Payer: Self-pay | Admitting: Cardiovascular Disease

## 2019-01-11 ENCOUNTER — Other Ambulatory Visit: Payer: Self-pay

## 2019-01-11 ENCOUNTER — Ambulatory Visit (INDEPENDENT_AMBULATORY_CARE_PROVIDER_SITE_OTHER): Payer: Commercial Managed Care - PPO

## 2019-01-11 DIAGNOSIS — N186 End stage renal disease: Secondary | ICD-10-CM

## 2019-01-11 DIAGNOSIS — Z01810 Encounter for preprocedural cardiovascular examination: Secondary | ICD-10-CM

## 2019-01-12 DIAGNOSIS — D689 Coagulation defect, unspecified: Secondary | ICD-10-CM | POA: Diagnosis not present

## 2019-01-12 DIAGNOSIS — N2581 Secondary hyperparathyroidism of renal origin: Secondary | ICD-10-CM | POA: Diagnosis not present

## 2019-01-12 DIAGNOSIS — N186 End stage renal disease: Secondary | ICD-10-CM | POA: Diagnosis not present

## 2019-01-15 DIAGNOSIS — N186 End stage renal disease: Secondary | ICD-10-CM | POA: Diagnosis not present

## 2019-01-15 DIAGNOSIS — N2581 Secondary hyperparathyroidism of renal origin: Secondary | ICD-10-CM | POA: Diagnosis not present

## 2019-01-15 DIAGNOSIS — D689 Coagulation defect, unspecified: Secondary | ICD-10-CM | POA: Diagnosis not present

## 2019-01-16 ENCOUNTER — Encounter
Admission: RE | Admit: 2019-01-16 | Discharge: 2019-01-16 | Disposition: A | Discharge status: Home / Self Care | Payer: Commercial Managed Care - PPO | Source: Ambulatory Visit | Attending: Cardiovascular Disease | Admitting: Cardiovascular Disease

## 2019-01-16 ENCOUNTER — Other Ambulatory Visit: Payer: Self-pay

## 2019-01-16 DIAGNOSIS — Z01810 Encounter for preprocedural cardiovascular examination: Secondary | ICD-10-CM

## 2019-01-16 DIAGNOSIS — I129 Hypertensive chronic kidney disease with stage 1 through stage 4 chronic kidney disease, or unspecified chronic kidney disease: Secondary | ICD-10-CM | POA: Diagnosis not present

## 2019-01-16 DIAGNOSIS — N186 End stage renal disease: Secondary | ICD-10-CM | POA: Diagnosis not present

## 2019-01-16 DIAGNOSIS — Z992 Dependence on renal dialysis: Secondary | ICD-10-CM | POA: Diagnosis not present

## 2019-01-16 LAB — NM MYOCAR MULTI W/SPECT W/WALL MOTION / EF
CHL CUP NUCLEAR SDS: 8
CSEPEW: 1 METS
Exercise duration (min): 0 min
Exercise duration (sec): 0 s
LV dias vol: 145 mL (ref 62–150)
LV sys vol: 58 mL
MPHR: 166 {beats}/min
Peak HR: 107 {beats}/min
Percent HR: 64 %
Rest HR: 66 {beats}/min
SRS: 1
SSS: 6
TID: 0.93

## 2019-01-16 MED ORDER — TECHNETIUM TC 99M TETROFOSMIN IV KIT
10.0000 | PACK | Freq: Once | INTRAVENOUS | Status: AC | PRN
  Administered 2019-01-16: 10.95 via INTRAVENOUS

## 2019-01-16 MED ORDER — TECHNETIUM TC 99M TETROFOSMIN IV KIT
30.0000 | PACK | Freq: Once | INTRAVENOUS | Status: AC | PRN
  Administered 2019-01-16: 33.18 via INTRAVENOUS

## 2019-01-16 MED ORDER — REGADENOSON 0.4 MG/5ML IV SOLN
0.4000 mg | Freq: Once | INTRAVENOUS | Status: AC
  Administered 2019-01-16: 0.4 mg via INTRAVENOUS

## 2019-01-17 DIAGNOSIS — N2581 Secondary hyperparathyroidism of renal origin: Secondary | ICD-10-CM | POA: Diagnosis not present

## 2019-01-17 DIAGNOSIS — D689 Coagulation defect, unspecified: Secondary | ICD-10-CM | POA: Diagnosis not present

## 2019-01-17 DIAGNOSIS — N186 End stage renal disease: Secondary | ICD-10-CM | POA: Diagnosis not present

## 2019-01-19 DIAGNOSIS — D689 Coagulation defect, unspecified: Secondary | ICD-10-CM | POA: Diagnosis not present

## 2019-01-19 DIAGNOSIS — N186 End stage renal disease: Secondary | ICD-10-CM | POA: Diagnosis not present

## 2019-01-19 DIAGNOSIS — N2581 Secondary hyperparathyroidism of renal origin: Secondary | ICD-10-CM | POA: Diagnosis not present

## 2019-01-22 ENCOUNTER — Telehealth: Payer: Self-pay | Admitting: *Deleted

## 2019-01-22 DIAGNOSIS — N186 End stage renal disease: Secondary | ICD-10-CM | POA: Diagnosis not present

## 2019-01-22 DIAGNOSIS — N2581 Secondary hyperparathyroidism of renal origin: Secondary | ICD-10-CM | POA: Diagnosis not present

## 2019-01-22 DIAGNOSIS — D689 Coagulation defect, unspecified: Secondary | ICD-10-CM | POA: Diagnosis not present

## 2019-01-22 NOTE — Telephone Encounter (Signed)
No answer. Left message to call back.   

## 2019-01-22 NOTE — Telephone Encounter (Signed)
-----   Message from Minna Merritts, MD sent at 01/21/2019  8:00 PM EDT ----- Stress test Pharmacological myocardial perfusion imaging study with no significant  Ischemia Normal wall motion, EF estimated at 67% Low risk scan

## 2019-01-23 IMAGING — CT CT PELVIS W/ CM
1 series · 16 of 32 positions shown, 20 images · IV contrast (APPLIED)
Comparison: 09/28/2014.

CLINICAL DATA: Perianal abscess.

EXAM:
CT PELVIS WITH CONTRAST
TECHNIQUE: Multidetector CT imaging of the pelvis was performed using the
standard protocol following the bolus administration of intravenous
contrast.
CONTRAST:  125mL HLBVVF-RWW IOPAMIDOL (HLBVVF-RWW) INJECTION 61%

[Series 2: routine pelvis w/cm · axial · 0.93mm/px · z∈[-366,-81]mm · 16 of 65 slices shown, 20 images]
[im 5/65  soft-tissue]
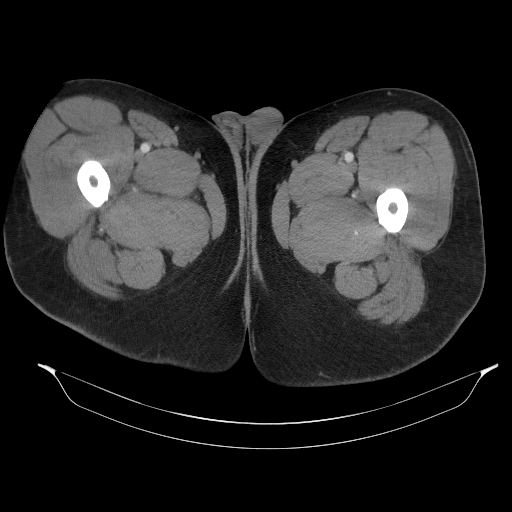
[im 5/65  bone]
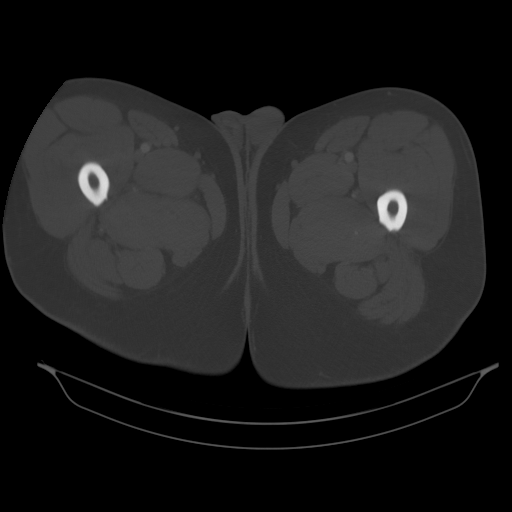
[im 9/65  soft-tissue]
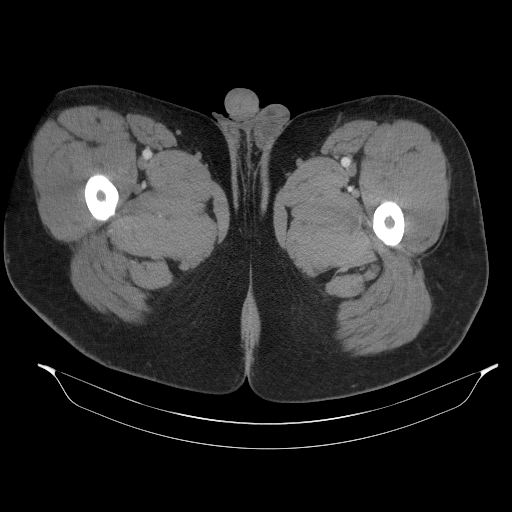
[im 13/65  soft-tissue]
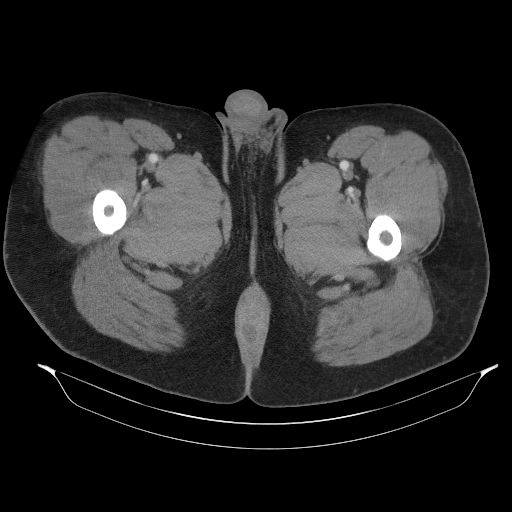
[im 17/65  soft-tissue]
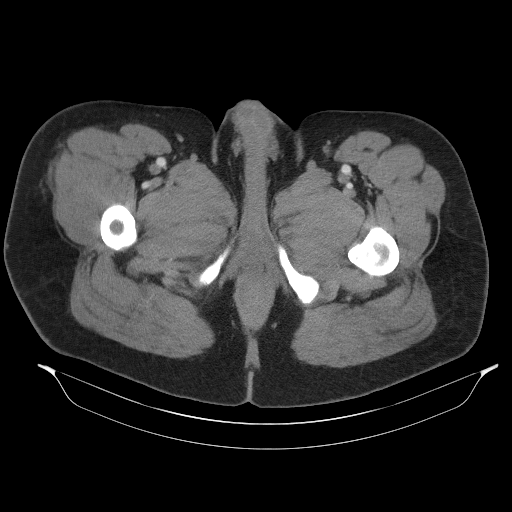
[im 21/65  soft-tissue]
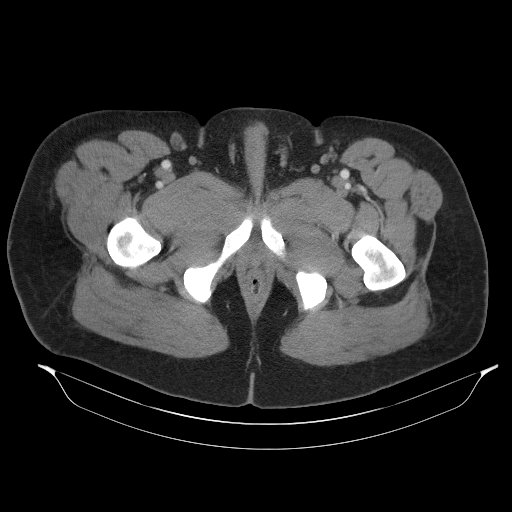
[im 25/65  soft-tissue]
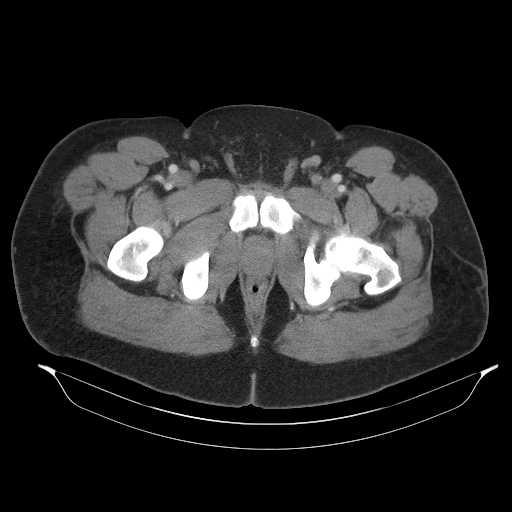
[im 29/65  soft-tissue]
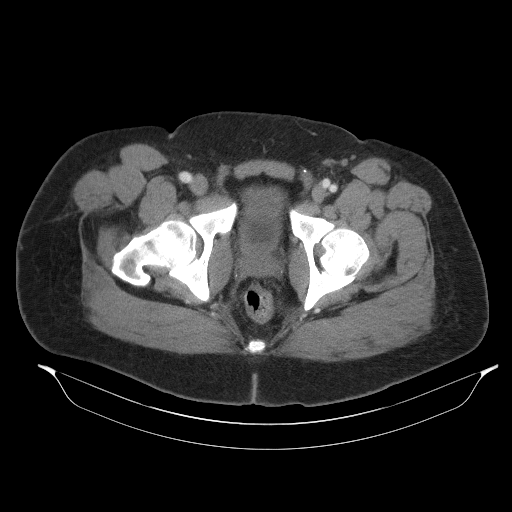
[im 36/65  soft-tissue]
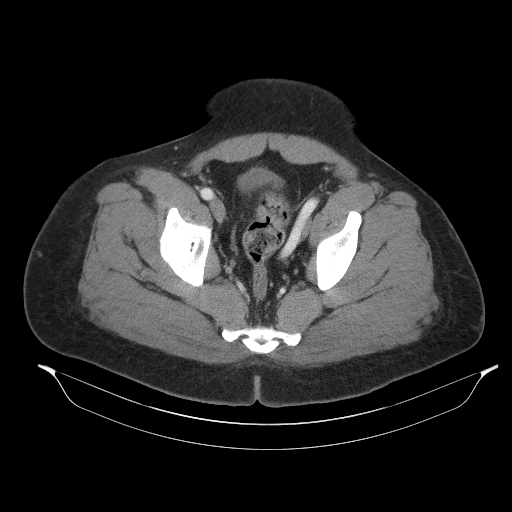
[im 40/65  soft-tissue]
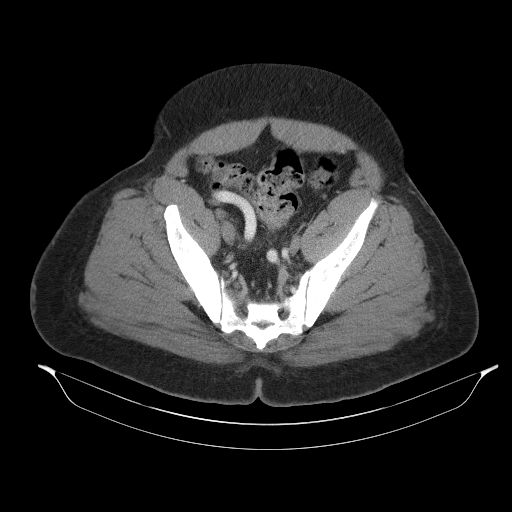
[im 40/65  bone]
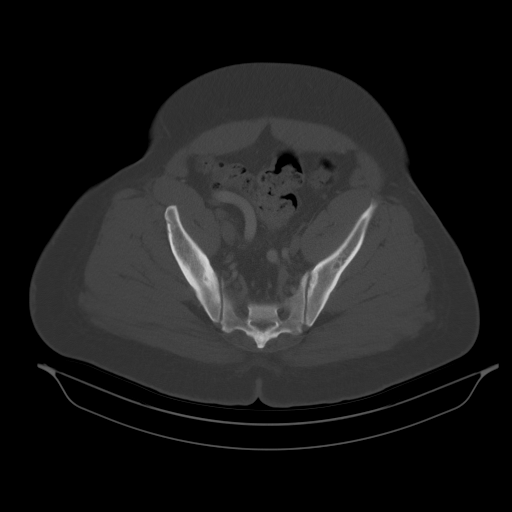
[im 44/65  soft-tissue]
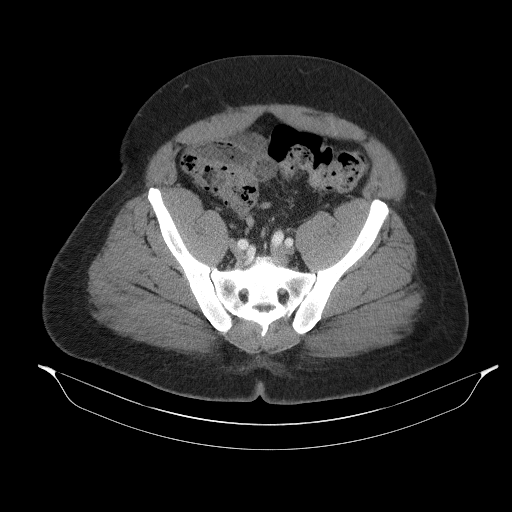
[im 48/65  soft-tissue]
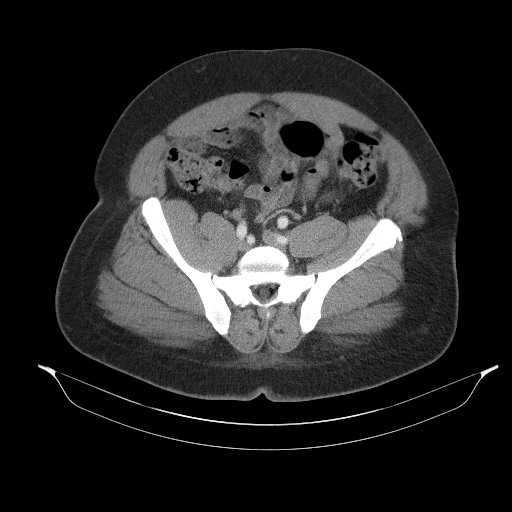
[im 52/65  soft-tissue]
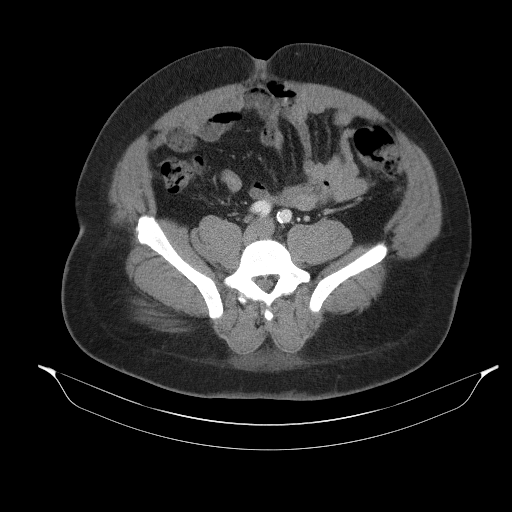
[im 56/65  soft-tissue]
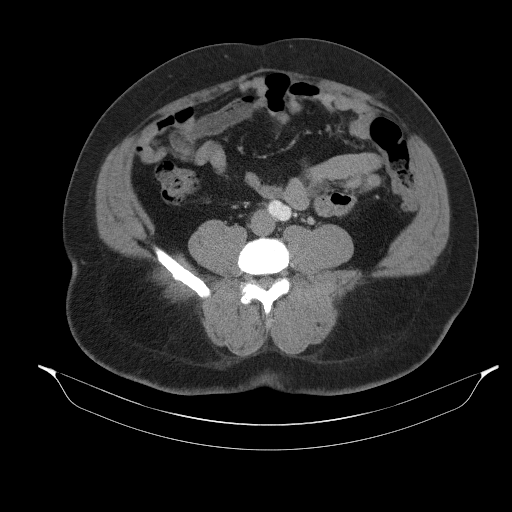
[im 56/65  lung]
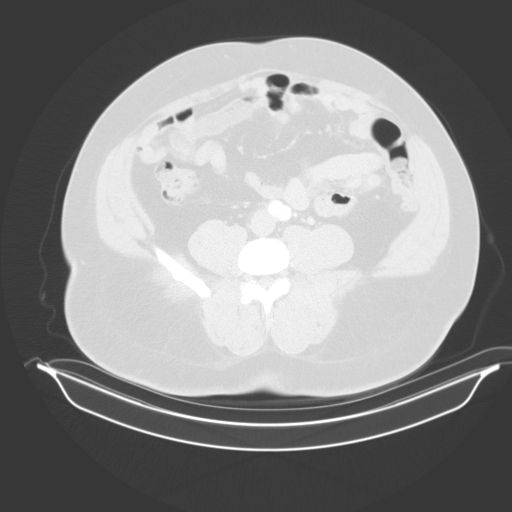
[im 58/65  lung]
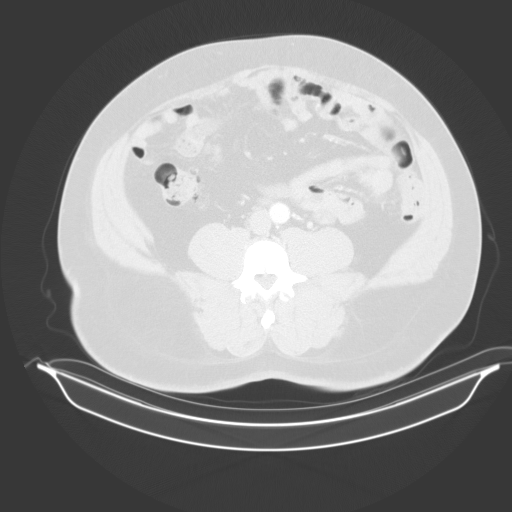
[im 60/65  soft-tissue]
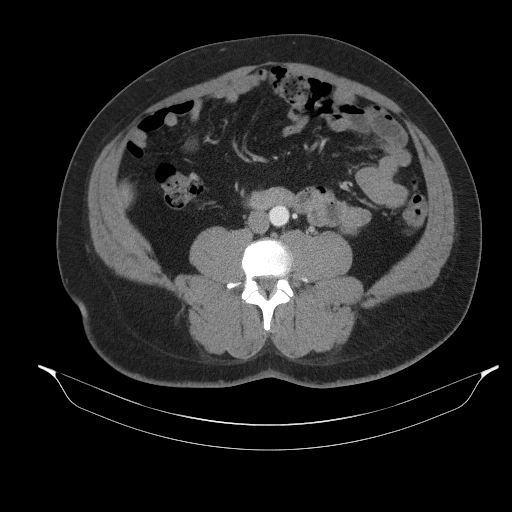
[im 60/65  lung]
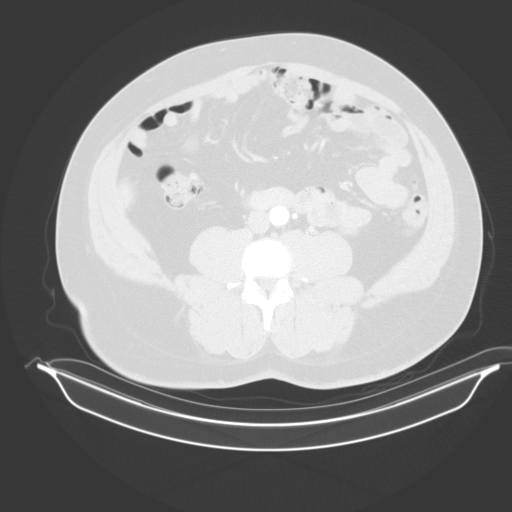
[im 62/65  lung]
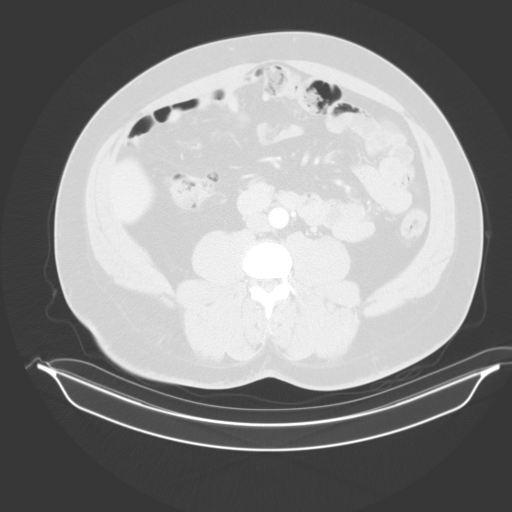

[16 of 32 positions shown; findings below may reference images not displayed]

FINDINGS: Urinary Tract: There may be a partially imaged 10 mm low-attenuation
lesion in the inferior pole of the right kidney. Ureters are
decompressed. Bladder is low in volume.

Bowel: Visualized portions of the small bowel and colon are
unremarkable. A low-attenuation fluid collection with peripheral
hyper attenuation is seen in the perirectal/perianal region,
measuring 2.3 x 3.3 X 4.1 cm.

Vascular/Lymphatic: Vascular structures are unremarkable. Left
inguinal lymph nodes measure up to 1.3 cm, unchanged.

Reproductive:  Prostate is visualized.

Other:  No free fluid.  Mesenteries and peritoneum are unremarkable.

Musculoskeletal: Negative.
IMPRESSION: Perirectal/perianal abscess.

## 2019-01-23 NOTE — Telephone Encounter (Signed)
Results called to pt. Pt verbalized understanding.  

## 2019-01-24 DIAGNOSIS — N2581 Secondary hyperparathyroidism of renal origin: Secondary | ICD-10-CM | POA: Diagnosis not present

## 2019-01-24 DIAGNOSIS — D689 Coagulation defect, unspecified: Secondary | ICD-10-CM | POA: Diagnosis not present

## 2019-01-24 DIAGNOSIS — N186 End stage renal disease: Secondary | ICD-10-CM | POA: Diagnosis not present

## 2019-01-26 DIAGNOSIS — N2581 Secondary hyperparathyroidism of renal origin: Secondary | ICD-10-CM | POA: Diagnosis not present

## 2019-01-26 DIAGNOSIS — D689 Coagulation defect, unspecified: Secondary | ICD-10-CM | POA: Diagnosis not present

## 2019-01-26 DIAGNOSIS — N186 End stage renal disease: Secondary | ICD-10-CM | POA: Diagnosis not present

## 2019-01-29 DIAGNOSIS — N186 End stage renal disease: Secondary | ICD-10-CM | POA: Diagnosis not present

## 2019-01-29 DIAGNOSIS — D689 Coagulation defect, unspecified: Secondary | ICD-10-CM | POA: Diagnosis not present

## 2019-01-29 DIAGNOSIS — N2581 Secondary hyperparathyroidism of renal origin: Secondary | ICD-10-CM | POA: Diagnosis not present

## 2019-01-31 DIAGNOSIS — D689 Coagulation defect, unspecified: Secondary | ICD-10-CM | POA: Diagnosis not present

## 2019-01-31 DIAGNOSIS — N186 End stage renal disease: Secondary | ICD-10-CM | POA: Diagnosis not present

## 2019-01-31 DIAGNOSIS — N2581 Secondary hyperparathyroidism of renal origin: Secondary | ICD-10-CM | POA: Diagnosis not present

## 2019-02-01 ENCOUNTER — Telehealth: Payer: Self-pay | Admitting: Cardiovascular Disease

## 2019-02-01 NOTE — Telephone Encounter (Signed)
Notes recorded by Minna Merritts, MD on 01/31/2019 at 10:01 PM EDT Echo Good looking study with normal EF No significant valve disease. We can forward to his renal transplant team

## 2019-02-01 NOTE — Telephone Encounter (Signed)
Attempted to call the patient with his echo results. No answer- I left a message to please call back.

## 2019-02-02 DIAGNOSIS — N186 End stage renal disease: Secondary | ICD-10-CM | POA: Diagnosis not present

## 2019-02-02 DIAGNOSIS — D689 Coagulation defect, unspecified: Secondary | ICD-10-CM | POA: Diagnosis not present

## 2019-02-02 DIAGNOSIS — N2581 Secondary hyperparathyroidism of renal origin: Secondary | ICD-10-CM | POA: Diagnosis not present

## 2019-02-05 DIAGNOSIS — D689 Coagulation defect, unspecified: Secondary | ICD-10-CM | POA: Diagnosis not present

## 2019-02-05 DIAGNOSIS — N186 End stage renal disease: Secondary | ICD-10-CM | POA: Diagnosis not present

## 2019-02-05 DIAGNOSIS — N2581 Secondary hyperparathyroidism of renal origin: Secondary | ICD-10-CM | POA: Diagnosis not present

## 2019-02-05 NOTE — Telephone Encounter (Signed)
Patient returning call for results 

## 2019-02-05 NOTE — Telephone Encounter (Signed)
Reviewed results. Pt verbalized understanding and had no further questions this time.

## 2019-02-05 NOTE — Telephone Encounter (Signed)
No answer. Left message to call back.   

## 2019-02-07 DIAGNOSIS — D689 Coagulation defect, unspecified: Secondary | ICD-10-CM | POA: Diagnosis not present

## 2019-02-07 DIAGNOSIS — N2581 Secondary hyperparathyroidism of renal origin: Secondary | ICD-10-CM | POA: Diagnosis not present

## 2019-02-07 DIAGNOSIS — N186 End stage renal disease: Secondary | ICD-10-CM | POA: Diagnosis not present

## 2019-02-09 DIAGNOSIS — N2581 Secondary hyperparathyroidism of renal origin: Secondary | ICD-10-CM | POA: Diagnosis not present

## 2019-02-09 DIAGNOSIS — D689 Coagulation defect, unspecified: Secondary | ICD-10-CM | POA: Diagnosis not present

## 2019-02-09 DIAGNOSIS — N186 End stage renal disease: Secondary | ICD-10-CM | POA: Diagnosis not present

## 2019-02-12 DIAGNOSIS — D689 Coagulation defect, unspecified: Secondary | ICD-10-CM | POA: Diagnosis not present

## 2019-02-12 DIAGNOSIS — N186 End stage renal disease: Secondary | ICD-10-CM | POA: Diagnosis not present

## 2019-02-12 DIAGNOSIS — N2581 Secondary hyperparathyroidism of renal origin: Secondary | ICD-10-CM | POA: Diagnosis not present

## 2019-02-14 DIAGNOSIS — N186 End stage renal disease: Secondary | ICD-10-CM | POA: Diagnosis not present

## 2019-02-14 DIAGNOSIS — N2581 Secondary hyperparathyroidism of renal origin: Secondary | ICD-10-CM | POA: Diagnosis not present

## 2019-02-14 DIAGNOSIS — D689 Coagulation defect, unspecified: Secondary | ICD-10-CM | POA: Diagnosis not present

## 2019-02-15 DIAGNOSIS — Z992 Dependence on renal dialysis: Secondary | ICD-10-CM | POA: Diagnosis not present

## 2019-02-15 DIAGNOSIS — N186 End stage renal disease: Secondary | ICD-10-CM | POA: Diagnosis not present

## 2019-02-15 DIAGNOSIS — I129 Hypertensive chronic kidney disease with stage 1 through stage 4 chronic kidney disease, or unspecified chronic kidney disease: Secondary | ICD-10-CM | POA: Diagnosis not present

## 2019-02-16 DIAGNOSIS — N2581 Secondary hyperparathyroidism of renal origin: Secondary | ICD-10-CM | POA: Diagnosis not present

## 2019-02-16 DIAGNOSIS — D689 Coagulation defect, unspecified: Secondary | ICD-10-CM | POA: Diagnosis not present

## 2019-02-16 DIAGNOSIS — N186 End stage renal disease: Secondary | ICD-10-CM | POA: Diagnosis not present

## 2019-02-19 DIAGNOSIS — N186 End stage renal disease: Secondary | ICD-10-CM | POA: Diagnosis not present

## 2019-02-19 DIAGNOSIS — N2581 Secondary hyperparathyroidism of renal origin: Secondary | ICD-10-CM | POA: Diagnosis not present

## 2019-02-19 DIAGNOSIS — D689 Coagulation defect, unspecified: Secondary | ICD-10-CM | POA: Diagnosis not present

## 2019-02-21 DIAGNOSIS — N186 End stage renal disease: Secondary | ICD-10-CM | POA: Diagnosis not present

## 2019-02-21 DIAGNOSIS — D689 Coagulation defect, unspecified: Secondary | ICD-10-CM | POA: Diagnosis not present

## 2019-02-21 DIAGNOSIS — N2581 Secondary hyperparathyroidism of renal origin: Secondary | ICD-10-CM | POA: Diagnosis not present

## 2019-02-23 DIAGNOSIS — N2581 Secondary hyperparathyroidism of renal origin: Secondary | ICD-10-CM | POA: Diagnosis not present

## 2019-02-23 DIAGNOSIS — D689 Coagulation defect, unspecified: Secondary | ICD-10-CM | POA: Diagnosis not present

## 2019-02-23 DIAGNOSIS — N186 End stage renal disease: Secondary | ICD-10-CM | POA: Diagnosis not present

## 2019-02-26 DIAGNOSIS — N186 End stage renal disease: Secondary | ICD-10-CM | POA: Diagnosis not present

## 2019-02-26 DIAGNOSIS — N2581 Secondary hyperparathyroidism of renal origin: Secondary | ICD-10-CM | POA: Diagnosis not present

## 2019-02-26 DIAGNOSIS — D689 Coagulation defect, unspecified: Secondary | ICD-10-CM | POA: Diagnosis not present

## 2019-02-28 DIAGNOSIS — D689 Coagulation defect, unspecified: Secondary | ICD-10-CM | POA: Diagnosis not present

## 2019-02-28 DIAGNOSIS — N186 End stage renal disease: Secondary | ICD-10-CM | POA: Diagnosis not present

## 2019-02-28 DIAGNOSIS — N2581 Secondary hyperparathyroidism of renal origin: Secondary | ICD-10-CM | POA: Diagnosis not present

## 2019-03-02 DIAGNOSIS — N2581 Secondary hyperparathyroidism of renal origin: Secondary | ICD-10-CM | POA: Diagnosis not present

## 2019-03-02 DIAGNOSIS — N186 End stage renal disease: Secondary | ICD-10-CM | POA: Diagnosis not present

## 2019-03-02 DIAGNOSIS — D689 Coagulation defect, unspecified: Secondary | ICD-10-CM | POA: Diagnosis not present

## 2019-03-05 ENCOUNTER — Emergency Department (HOSPITAL_COMMUNITY)
Admission: EM | Admit: 2019-03-05 | Discharge: 2019-03-05 | Disposition: A | Discharge status: Home / Self Care | Payer: Commercial Managed Care - PPO | Attending: Emergency Medicine | Admitting: Emergency Medicine

## 2019-03-05 ENCOUNTER — Other Ambulatory Visit: Payer: Self-pay

## 2019-03-05 ENCOUNTER — Emergency Department (HOSPITAL_COMMUNITY): Payer: Commercial Managed Care - PPO

## 2019-03-05 DIAGNOSIS — R05 Cough: Secondary | ICD-10-CM | POA: Diagnosis present

## 2019-03-05 DIAGNOSIS — Z79899 Other long term (current) drug therapy: Secondary | ICD-10-CM | POA: Diagnosis not present

## 2019-03-05 DIAGNOSIS — U071 COVID-19: Secondary | ICD-10-CM | POA: Diagnosis not present

## 2019-03-05 DIAGNOSIS — I129 Hypertensive chronic kidney disease with stage 1 through stage 4 chronic kidney disease, or unspecified chronic kidney disease: Secondary | ICD-10-CM | POA: Diagnosis not present

## 2019-03-05 DIAGNOSIS — N183 Chronic kidney disease, stage 3 (moderate): Secondary | ICD-10-CM | POA: Insufficient documentation

## 2019-03-05 LAB — CBC WITH DIFFERENTIAL/PLATELET
Abs Immature Granulocytes: 0.03 10*3/uL (ref 0.00–0.07)
Basophils Absolute: 0 10*3/uL (ref 0.0–0.1)
Basophils Relative: 0 %
Eosinophils Absolute: 0.1 10*3/uL (ref 0.0–0.5)
Eosinophils Relative: 2 %
HCT: 28.6 % — ABNORMAL LOW (ref 39.0–52.0)
Hemoglobin: 9.4 g/dL — ABNORMAL LOW (ref 13.0–17.0)
Immature Granulocytes: 1 %
Lymphocytes Relative: 10 %
Lymphs Abs: 0.7 10*3/uL (ref 0.7–4.0)
MCH: 34.7 pg — ABNORMAL HIGH (ref 26.0–34.0)
MCHC: 32.9 g/dL (ref 30.0–36.0)
MCV: 105.5 fL — ABNORMAL HIGH (ref 80.0–100.0)
Monocytes Absolute: 1.3 10*3/uL — ABNORMAL HIGH (ref 0.1–1.0)
Monocytes Relative: 19 %
Neutro Abs: 4.5 10*3/uL (ref 1.7–7.7)
Neutrophils Relative %: 68 %
Platelets: 229 10*3/uL (ref 150–400)
RBC: 2.71 MIL/uL — ABNORMAL LOW (ref 4.22–5.81)
RDW: 14.6 % (ref 11.5–15.5)
WBC: 6.6 10*3/uL (ref 4.0–10.5)
nRBC: 0 % (ref 0.0–0.2)

## 2019-03-05 LAB — BASIC METABOLIC PANEL
Anion gap: 12 (ref 5–15)
BUN: 53 mg/dL — ABNORMAL HIGH (ref 6–20)
CO2: 26 mmol/L (ref 22–32)
Calcium: 9.8 mg/dL (ref 8.9–10.3)
Chloride: 97 mmol/L — ABNORMAL LOW (ref 98–111)
Creatinine, Ser: 12.66 mg/dL — ABNORMAL HIGH (ref 0.61–1.24)
GFR calc Af Amer: 5 mL/min — ABNORMAL LOW (ref >60)
GFR calc non Af Amer: 4 mL/min — ABNORMAL LOW (ref >60)
Glucose, Bld: 110 mg/dL — ABNORMAL HIGH (ref 70–99)
Potassium: 4.2 mmol/L (ref 3.5–5.1)
Sodium: 135 mmol/L (ref 135–145)

## 2019-03-05 LAB — SARS CORONAVIRUS 2 BY RT PCR (HOSPITAL ORDER, PERFORMED IN ~~LOC~~ HOSPITAL LAB): SARS Coronavirus 2: POSITIVE — AB

## 2019-03-05 MED ORDER — ONDANSETRON HCL 4 MG/2ML IJ SOLN
4.0000 mg | Freq: Once | INTRAMUSCULAR | Status: AC
  Administered 2019-03-05: 4 mg via INTRAVENOUS
  Filled 2019-03-05: qty 2

## 2019-03-05 MED ORDER — IBUPROFEN 400 MG PO TABS
600.0000 mg | ORAL_TABLET | Freq: Once | ORAL | Status: AC
  Administered 2019-03-05: 600 mg via ORAL
  Filled 2019-03-05: qty 1

## 2019-03-05 MED ORDER — ONDANSETRON 4 MG PO TBDP: 4.0000 mg | ORAL_TABLET | Freq: Three times a day (TID) | ORAL | 0 refills | Status: DC | PRN

## 2019-03-05 MED ORDER — SODIUM CHLORIDE 0.9 % IV BOLUS
500.0000 mL | Freq: Once | INTRAVENOUS | Status: AC
  Administered 2019-03-05: 11:00:00 500 mL via INTRAVENOUS

## 2019-03-05 NOTE — ED Notes (Signed)
Portable xray at bedside.

## 2019-03-05 NOTE — ED Triage Notes (Signed)
2 day hx of covid like sx, sob, cough, N/, headache and pressure-burning to chest with cough.

## 2019-03-05 NOTE — ED Notes (Signed)
Checked pulse ox while ambulating. O2 sat decreased from 97% to 95% during ambulation, returned to 98% with rest. Pulse increased from 78 to 120 bpm, decreased to 65bpm with rest. Pt denies dizziness but does report "some shortness of breath, but not much" during activity.

## 2019-03-05 NOTE — ED Notes (Signed)
Patient verbalizes understanding of discharge instructions. Opportunity for questioning and answers were provided. Armband removed by staff, pt discharged from ED. Pt educated on isolation due to covid 19 results.

## 2019-03-05 NOTE — ED Provider Notes (Signed)
[facility] EMERGENCY DEPARTMENT Provider Note   CSN: [id number] Arrival date & time: 03/05/19  3220    History   Chief Complaint Cough, shortness of breath  HPI Colin [name] is a 53 y.o. male with medical history of anemia CKD, hypertension, UC, dialysis M/W/F at [facility] presenting to emergency department today with cough, shortness of breath, congestion x 3 days. Pt states his cough is gradually worsening in intensity and is nonproductive. He feels congestion behind his eyes and has nasal congestion with yellow mucus. He admits to associated shortness of breath and nausea. Pt reports chest pain only after long coughing episodes that he describes as burning. He has felt warm but does not have thermometer at home to check his temperature. Took Tylenol 1 hour prior to arrival. He denies any sick contacts or known exposure to anyone positive with covid-19. Also denies sore throat, wheezing, urinary symptoms, diarrhea, vomiting, generalized body aches. He is scheduled for dialysis this afternoon, has not missed dialysis recently.   History provided by patient with additional history obtained from chart review.     Past Medical History:  Diagnosis Date  . Anemia    iron deficiency  . Arthritis    Gout  . CKD (chronic kidney disease) stage 3, GFR 30-59 ml/min (HCC)    Stage 4  . Gout   . History of chicken pox   . Hypertension   . Melanosis coli   . Mumps   . Pneumonia   . Ulcerative colitis Colin [name]    Patient Active Problem List   Diagnosis Date Noted  . Hyperprolactinemia (Colin [name]) 01/17/2018  . Screening for malignant neoplasm of prostate 01/17/2018  . Gynecomastia 01/17/2018  . Ingrown toenail 06/21/2017  . Onychomycosis 06/21/2017  . ESRD (end stage renal disease) ([place]) 02/07/2017  . Morbid obesity (Colin [name]) 02/07/2017  . Bilateral leg edema 10/02/2015  . Hyperlipidemia 10/02/2015  . Edema 02/09/2011  . Chronic renal  insufficiency 02/09/2011  . ANEMIA, IRON DEFICIENCY 10/16/2007  . Essential hypertension 10/16/2007  . Ulcerative colitis (Colin [name]) 02/13/2007  . MELANOSIS COLI 02/13/2007    Past Surgical History:  Procedure Laterality Date  . AV FISTULA PLACEMENT Right 08/27/2016   Procedure: RIGHT RADIOCEPHALIC ARTERIOVENOUS FISTULA CREATION;  Surgeon: [name], MD;  Location: [facility] OR;  Service: Vascular;  Laterality: Right;  . AV FISTULA PLACEMENT Right 10/21/2017   Procedure: ARTERIOVENOUS (AV) FISTULA CREATION RIGHT ARM;  Surgeon: [name], MD;  Location: [facility];  Service: Vascular;  Laterality: Right;  . COLONOSCOPY    . FISTULA SUPERFICIALIZATION Right 11/12/2016   Procedure: FISTULA SUPERFICIALIZATION RIGHT FOREEARM;  Surgeon: [name], MD;  Location: [facility] OR;  Service: Vascular;  Laterality: Right;  . REVISON OF ARTERIOVENOUS FISTULA Right 12/23/2017   Procedure: CEPHALIC VEIN TRANSPOSITION ARTERIOVENOUS FISTULA RIGHT UPPER ARM;  Surgeon: [name], MD;  Location: [facility] OR;  Service: Vascular;  Laterality: Right;  . TENDON REPAIR  1987   Left index finger        Home Medications    Prior to Admission medications   Medication Sig Start Date End Date Taking? Authorizing Provider  acetaminophen (TYLENOL) 500 MG tablet Take 500 mg by mouth every 6 (six) hours as needed for mild pain.   Yes [provider]  allopurinol (ZYLOPRIM) 100 MG tablet Take 150 mg by mouth daily.   Yes [provider]  amLODipine (NORVASC) 10 MG tablet Take 10 mg by mouth  at bedtime.    Yes [provider]  atenolol (TENORMIN) 50 MG tablet Take 100 mg by mouth at bedtime. 09/04/17  Yes [provider]  CALCITRIOL PO Take 3 capsules by mouth every Monday, Wednesday, and Friday. Only on dialysis days   Yes [provider]  ferrous sulfate 325 (65 FE) MG tablet Take 325 mg by mouth at bedtime.   Yes [provider]  HUMIRA PEN 40 MG/0.4ML PNKT Inject 40 mg into the  muscle every 14 (fourteen) days.  11/22/17  Yes [provider]  multivitamin (RENA-VIT) TABS tablet Take 1 tablet by mouth at bedtime. 07/24/18  Yes [provider]  pravastatin (PRAVACHOL) 40 MG tablet Take 40 mg by mouth at bedtime.    Yes [provider]  sevelamer carbonate (RENVELA) 800 MG tablet Take 2,400 mg by mouth 3 (three) times daily with meals.    Yes [provider]  bromocriptine (PARLODEL) 2.5 MG tablet Take 1 tablet (2.5 mg total) by mouth at bedtime. Patient not taking: Reported on 10/27/2018 01/17/18   Renato Shin, MD  cyclobenzaprine (FLEXERIL) 10 MG tablet Take 1 tablet (10 mg total) by mouth 2 (two) times daily as needed for muscle spasms. Patient not taking: Reported on 10/27/2018 08/15/18   Wieters, Hallie C, PA-C  diclofenac sodium (VOLTAREN) 1 % GEL Apply 2 g topically 4 (four) times daily. Patient not taking: Reported on 10/27/2018 08/15/18   Wieters, Hallie C, PA-C  hydrALAZINE (APRESOLINE) 50 MG tablet Take 1 tablet (50 mg total) by mouth at bedtime. Patient not taking: Reported on 10/27/2018 08/27/16   Alvia Grove, PA-C  HYDROcodone-acetaminophen (NORCO) 5-325 MG tablet Take 1 tablet by mouth every 6 (six) hours as needed for moderate pain. Patient not taking: Reported on 01/09/2019 12/23/17   Dagoberto Ligas, PA-C  lidocaine (XYLOCAINE) 5 % ointment Apply 1 application topically 4 (four) times daily as needed. Patient not taking: Reported on 01/09/2019 10/27/18   Virgel Manifold, MD  ondansetron (ZOFRAN ODT) 4 MG disintegrating tablet Take 1 tablet (4 mg total) by mouth every 8 (eight) hours as needed for nausea or vomiting. 03/05/19   Albrizze, Verline Lema E, PA-C  psyllium (METAMUCIL SMOOTH TEXTURE) 58.6 % powder Take 1 packet by mouth 3 (three) times daily. Patient not taking: Reported on 03/05/2019 10/27/18   Virgel Manifold, MD  traMADol (ULTRAM) 50 MG tablet Take 1 tablet (50 mg total) by mouth every 12 (twelve) hours as needed. Patient  not taking: Reported on 10/27/2018 08/15/18   Janith Lima, PA-C    Family History Family History  Problem Relation Age of Onset  . Hypertension Father   . Heart disease Father   . Renal Disease Father   . Hypertension Mother     Social History Social History   Tobacco Use  . Smoking status: Never Smoker  . Smokeless tobacco: Former Systems developer    Types: Chew  Substance Use Topics  . Alcohol use: Yes    Comment: rare  . Drug use: No     Allergies   Sulfa antibiotics   Review of Systems Review of Systems  Constitutional: Positive for fever. Negative for chills.  HENT: Positive for congestion. Negative for facial swelling, rhinorrhea, sinus pressure, sore throat and trouble swallowing.   Eyes: Negative for pain and redness.  Respiratory: Positive for cough and shortness of breath. Negative for wheezing.   Cardiovascular: Positive for chest pain. Negative for palpitations.  Gastrointestinal: Positive for nausea. Negative for abdominal pain, constipation,  diarrhea and vomiting.  Genitourinary: Negative for dysuria.  Musculoskeletal: Negative for arthralgias, back pain, myalgias and neck pain.  Skin: Negative for rash and wound.  Neurological: Negative for dizziness, syncope, weakness, numbness and headaches.  Psychiatric/Behavioral: Negative for confusion.     Physical Exam Updated Vital Signs BP 122/78 (BP Location: Left Arm)   Pulse 88   Temp 100.2 F (37.9 C) (Oral)   Ht 5' 5"  (1.651 m)   Wt 104.3 kg   SpO2 98%   BMI 38.27 kg/m   Physical Exam Vitals signs and nursing note reviewed.  Constitutional:      Appearance: He is well-developed. He is not ill-appearing or toxic-appearing.  HENT:     Head: Normocephalic and atraumatic.     Comments: No sinus or temporal tenderness.    Right Ear: Tympanic membrane and external ear normal.     Left Ear: Tympanic membrane and external ear normal.     Nose: Congestion present.     Mouth/Throat:     Comments: No  erythema to oropharynx, no edema, no exudate, no tonsillar swelling, voice normal, neck supple without lymphadenopathy  Eyes:     General: No scleral icterus.       Right eye: No discharge.        Left eye: No discharge.     Conjunctiva/sclera: Conjunctivae normal.  Neck:     Musculoskeletal: Normal range of motion. No muscular tenderness.     Vascular: No JVD.  Cardiovascular:     Rate and Rhythm: Normal rate and regular rhythm.     Pulses: Normal pulses.     Heart sounds: Normal heart sounds.  Pulmonary:     Effort: Pulmonary effort is normal.     Breath sounds: Normal breath sounds.     Comments: Pt is speaking in full sentences. No use of accessory muscles. SpO2 is 98% on room air during my exam. Normal work of breathing  Abdominal:     General: There is no distension.     Palpations: Abdomen is soft.     Tenderness: There is no abdominal tenderness. There is no guarding or rebound.     Comments: No peritoneal signs  Musculoskeletal: Normal range of motion.     Right lower leg: No edema.     Left lower leg: No edema.  Lymphadenopathy:     Cervical: No cervical adenopathy.  Skin:    General: Skin is warm and dry.  Neurological:     Mental Status: He is oriented to person, place, and time.     Comments: Fluent speech, no facial droop.  Psychiatric:        Behavior: Behavior normal.      ED Treatments / Results  Labs (all labs ordered are listed, but only abnormal results are displayed) Labs Reviewed  SARS CORONAVIRUS 2 (HOSPITAL ORDER, [facility]) - Abnormal; Notable for the following components:      Result Value   SARS Coronavirus 2 POSITIVE (*)    All other components within normal limits  BASIC METABOLIC PANEL - Abnormal; Notable for the following components:   Chloride 97 (*)    Glucose, Bld 110 (*)    BUN 53 (*)    Creatinine, Ser 12.66 (*)    GFR calc non Af Amer 4 (*)    GFR calc Af Amer 5 (*)    All other components within  normal limits  CBC WITH DIFFERENTIAL/PLATELET - Abnormal; Notable for the following components:  RBC 2.71 (*)    Hemoglobin 9.4 (*)    HCT 28.6 (*)    MCV 105.5 (*)    MCH 34.7 (*)    Monocytes Absolute 1.3 (*)    All other components within normal limits    EKG None  Radiology Dg Chest 1 View  Result Date: 03/05/2019 CLINICAL DATA:  Cough and shortness of breath EXAM: CHEST  1 VIEW COMPARISON:  05/07/2017 FINDINGS: Normal heart size and mediastinal contours. No acute infiltrate or edema. No effusion or pneumothorax. No acute osseous findings. Artifact from EKG leads. IMPRESSION: Negative chest. Electronically Signed   By: Monte Fantasia M.D.   On: 03/05/2019 09:20    Procedures Procedures (including critical care time)  Medications Ordered in ED Medications  ibuprofen (ADVIL) tablet 600 mg (600 mg Oral Given 03/05/19 0938)  sodium chloride 0.9 % bolus 500 mL (0 mLs Intravenous Stopped 03/05/19 1221)  ondansetron (ZOFRAN) injection 4 mg (4 mg Intravenous Given 03/05/19 1055)     Initial Impression / Assessment and Plan / ED Course  I have reviewed the triage vital signs and the nursing notes.  Pertinent labs & imaging results that were available during my care of the patient were reviewed by me and considered in my medical decision making (see chart for details).  53 yo male presents with cough, congestion, fever x 3 days. On arrival he has low grade fever of 100.2. He is non septic appearing and in no acute distress. Pt is not tachycardic or hypoxic. DDx includes URI, Covid, pneumonia, sinus infection. Work up initiated with CBC, BMP, chest xray, IVF, zofran, ibuprofen. Pt is scheduled for dialysis this afternoon. Pt is positive for coronavirus.  I personally viewed chest xray and do not see infiltrate, agree with radiology.CBC without leukocytosis, overall unremarkable. BMP with BUN/Creatinine of 53/12.66.  Chart review shows Creatinine of 8.5 back in 11/2018. Potassium is 4.2.Pt  is scheduled for dialysis this afternoon. Consulted nephrology for recommendations because of his positive test. Case discussed with nephrologist Dr. Justin Mend who will facilitate dialysis. Also spoke with nephrology PA who has pt scheduled for 3rd shift dialysis tonight at [facility].Pt ambulated without difficulty and SpO2 was 96% on room air.  Patient is hemodynamically stable, in NAD at discharge. Evaluation does not show pathology that would require ongoing emergent intervention or inpatient treatment. I explained the diagnosis to the patient.  Patient is comfortable with above plan and is stable for discharge at this time. All questions were answered prior to disposition.  Strict return precautions for returning to the ED were discussed including fever, worsening shortness of breath. Encouraged follow up with PCP. Pt case discussed with Dr. Wilson Singer who agrees with my plan.   Colin [name] was evaluated in Emergency Department on 03/05/2019 for the symptoms described in the history of present illness. He was evaluated in the context of the global COVID-19 pandemic, which necessitated consideration that the patient might be at risk for infection with the SARS-CoV-2 virus that causes COVID-19. Institutional protocols and algorithms that pertain to the evaluation of patients at risk for COVID-19 are in a state of rapid change based on information released by regulatory bodies including the CDC and federal and state organizations. These policies and algorithms were followed during the patient's care in the ED.  This note was prepared with assistance of Systems analyst. Occasional wrong-word or sound-a-like substitutions may have occurred due to the inherent limitations of voice recognition software.   Final Clinical Impressions(s) /  ED Diagnoses   Final diagnoses:  [id number]    ED Discharge Orders         Ordered    ondansetron (ZOFRAN ODT) 4 MG disintegrating tablet  Every 8 hours  PRN     03/05/19 1311           Albrizze, [place] 03/05/19 1920    Virgel Manifold, MD 03/06/19 0710

## 2019-03-05 NOTE — ED Notes (Signed)
This RN attempted IV in Lt extremity with no success. IV team consulted.

## 2019-03-05 NOTE — ED Triage Notes (Signed)
Pt also does dialysis mon-wed-fri.

## 2019-03-05 NOTE — Discharge Instructions (Addendum)
You have been seen today for cough and shortness of breath. Please read and follow all provided instructions. Return to the emergency room for worsening condition or new concerning symptoms including fever and worsening shortness of breath.  1. Medications:  Continue usual home medications Take medications as prescribed. Please review all of the medicines and only take them if you do not have an allergy to them.  2. Treatment: rest, drink plenty of fluids 3. Follow Up: Please follow up with your primary doctor in 2-5 days for discussion of your diagnoses and further evaluation after today's visit; Call today to arrange your follow up.  If you do not have a primary care doctor use the resource guide provided to find one;   It is also a possibility that you have an allergic reaction to any of the medicines that you have been prescribed - Everybody reacts differently to medications and while MOST people have no trouble with most medicines, you may have a reaction such as nausea, vomiting, rash, swelling, shortness of breath. If this is the case, please stop taking the medicine immediately and contact your physician.  ?  Your coronavirus test is positive. You will need to self quarantine for the next 2 weeks with the exception of going to dialysis.   I spoke with the kidney doctors who have scheduled you for 3rd shift dialysis at [facility]. The address is [address], [place] [address]. The phone number is [phone]. If you need help with transportation please call them. Someone from the center should be in contact with you today.          Person Under Monitoring Name: Colin [name]  Location: [place] [facility] [address]   Infection Prevention Recommendations for Individuals Confirmed to have, or Being Evaluated for, 2019 Novel Coronavirus (COVID-19) Infection Who Receive Care at Home  Individuals who are confirmed to have, or are being evaluated for,  COVID-19 should follow the prevention steps below until a healthcare provider or local or state health department says they can return to normal activities.  Stay home except to get medical care You should restrict activities outside your home, except for getting medical care. Do not go to work, school, or public areas, and do not use public transportation or taxis.  Call ahead before visiting your doctor Before your medical appointment, call the healthcare provider and tell them that you have, or are being evaluated for, COVID-19 infection. This will help the healthcare providers office take steps to keep other people from getting infected. Ask your healthcare provider to call the local or state health department.  Monitor your symptoms Seek prompt medical attention if your illness is worsening (e.g., difficulty breathing). Before going to your medical appointment, call the healthcare provider and tell them that you have, or are being evaluated for, COVID-19 infection. Ask your healthcare provider to call the local or state health department.  Wear a facemask You should wear a facemask that covers your nose and mouth when you are in the same room with other people and when you visit a healthcare provider. People who live with or visit you should also wear a facemask while they are in the same room with you.  Separate yourself from other people in your home As much as possible, you should stay in a different room from other people in your home. Also, you should use a separate bathroom, if available.  Avoid sharing household items You should not share dishes,  drinking glasses, cups, eating utensils, towels, bedding, or other items with other people in your home. After using these items, you should wash them thoroughly with soap and water.  Cover your coughs and sneezes Cover your mouth and nose with a tissue when you cough or sneeze, or you can cough or sneeze into your sleeve.  Throw used tissues in a lined trash can, and immediately wash your hands with soap and water for at least 20 seconds or use an alcohol-based hand rub.  Wash your [facility] your hands often and thoroughly with soap and water for at least 20 seconds. You can use an alcohol-based hand sanitizer if soap and water are not available and if your hands are not visibly dirty. Avoid touching your eyes, nose, and mouth with unwashed hands.   Prevention Steps for Caregivers and Household Members of Individuals Confirmed to have, or Being Evaluated for, COVID-19 Infection Being Cared for in the Home  If you live with, or provide care at home for, a person confirmed to have, or being evaluated for, COVID-19 infection please follow these guidelines to prevent infection:  Follow healthcare providers instructions Make sure that you understand and can help the patient follow any healthcare provider instructions for all care.  Provide for the patients basic needs You should help the patient with basic needs in the home and provide support for getting groceries, prescriptions, and other personal needs.  Monitor the patients symptoms If they are getting sicker, call his or her medical provider and tell them that the patient has, or is being evaluated for, COVID-19 infection. This will help the healthcare providers office take steps to keep other people from getting infected. Ask the healthcare provider to call the local or state health department.  Limit the number of people who have contact with the patient If possible, have only one caregiver for the patient. Other household members should stay in another home or place of residence. If this is not possible, they should stay in another room, or be separated from the patient as much as possible. Use a separate bathroom, if available. Restrict visitors who do not have an essential need to be in the home.  Keep older adults, very young children, and  other sick people away from the patient Keep older adults, very young children, and those who have compromised immune systems or chronic health conditions away from the patient. This includes people with chronic heart, lung, or kidney conditions, diabetes, and cancer.  Ensure good ventilation Make sure that shared spaces in the home have good air flow, such as from an air conditioner or an opened window, weather permitting.  Wash your hands often Wash your hands often and thoroughly with soap and water for at least 20 seconds. You can use an alcohol based hand sanitizer if soap and water are not available and if your hands are not visibly dirty. Avoid touching your eyes, nose, and mouth with unwashed hands. Use disposable paper towels to dry your hands. If not available, use dedicated cloth towels and replace them when they become wet.  Wear a facemask and gloves Wear a disposable facemask at all times in the room and gloves when you touch or have contact with the patients blood, body fluids, and/or secretions or excretions, such as sweat, saliva, sputum, nasal mucus, vomit, urine, or feces.  Ensure the mask fits over your nose and mouth tightly, and do not touch it during use. Throw out disposable facemasks and gloves after using  them. Do not reuse. Wash your hands immediately after removing your facemask and gloves. If your personal clothing becomes contaminated, carefully remove clothing and launder. Wash your hands after handling contaminated clothing. Place all used disposable facemasks, gloves, and other waste in a lined container before disposing them with other household waste. Remove gloves and wash your hands immediately after handling these items.  Do not share dishes, glasses, or other household items with the patient Avoid sharing household items. You should not share dishes, drinking glasses, cups, eating utensils, towels, bedding, or other items with a patient who is confirmed to  have, or being evaluated for, COVID-19 infection. After the person uses these items, you should wash them thoroughly with soap and water.  Wash laundry thoroughly Immediately remove and wash clothes or bedding that have blood, body fluids, and/or secretions or excretions, such as sweat, saliva, sputum, nasal mucus, vomit, urine, or feces, on them. Wear gloves when handling laundry from the patient. Read and follow directions on labels of laundry or clothing items and detergent. In general, wash and dry with the warmest temperatures recommended on the label.  Clean all areas the individual has used often Clean all touchable surfaces, such as counters, tabletops, doorknobs, bathroom fixtures, toilets, phones, keyboards, tablets, and bedside tables, every day. Also, clean any surfaces that may have blood, body fluids, and/or secretions or excretions on them. Wear gloves when cleaning surfaces the patient has come in contact with. Use a diluted bleach solution (e.g., dilute bleach with 1 part bleach and 10 parts water) or a household disinfectant with a label that says EPA-registered for coronaviruses. To make a bleach solution at home, add 1 tablespoon of bleach to 1 quart (4 cups) of water. For a larger supply, add  cup of bleach to 1 gallon (16 cups) of water. Read labels of cleaning products and follow recommendations provided on product labels. Labels contain instructions for safe and effective use of the cleaning product including precautions you should take when applying the product, such as wearing gloves or eye protection and making sure you have good ventilation during use of the product. Remove gloves and wash hands immediately after cleaning.  Monitor yourself for signs and symptoms of illness Caregivers and household members are considered close contacts, should monitor their health, and will be asked to limit movement outside of the home to the extent possible. Follow the monitoring steps  for close contacts listed on the symptom monitoring form.   ? If you have additional questions, contact your local health department or call the epidemiologist on call at [phone] (available 24/7). ? This guidance is subject to change. For the most up-to-date guidance from [facility], please refer to their website: YouBlogs.pl

## 2019-03-09 ENCOUNTER — Encounter (HOSPITAL_COMMUNITY): Payer: Self-pay | Admitting: *Deleted

## 2019-03-09 ENCOUNTER — Inpatient Hospital Stay (HOSPITAL_COMMUNITY)
Admission: EM | Admit: 2019-03-09 | Discharge: 2019-03-20 | DRG: 177 | Disposition: A | Discharge status: Home / Self Care | Payer: Commercial Managed Care - PPO | Attending: Internal Medicine | Admitting: Internal Medicine

## 2019-03-09 ENCOUNTER — Other Ambulatory Visit: Payer: Self-pay

## 2019-03-09 DIAGNOSIS — Z8249 Family history of ischemic heart disease and other diseases of the circulatory system: Secondary | ICD-10-CM

## 2019-03-09 DIAGNOSIS — J1282 Pneumonia due to coronavirus disease 2019: Secondary | ICD-10-CM | POA: Diagnosis present

## 2019-03-09 DIAGNOSIS — M1990 Unspecified osteoarthritis, unspecified site: Secondary | ICD-10-CM | POA: Diagnosis present

## 2019-03-09 DIAGNOSIS — D631 Anemia in chronic kidney disease: Secondary | ICD-10-CM | POA: Diagnosis present

## 2019-03-09 DIAGNOSIS — Z992 Dependence on renal dialysis: Secondary | ICD-10-CM

## 2019-03-09 DIAGNOSIS — K5190 Ulcerative colitis, unspecified, without complications: Secondary | ICD-10-CM | POA: Diagnosis present

## 2019-03-09 DIAGNOSIS — E669 Obesity, unspecified: Secondary | ICD-10-CM | POA: Diagnosis present

## 2019-03-09 DIAGNOSIS — R0902 Hypoxemia: Secondary | ICD-10-CM

## 2019-03-09 DIAGNOSIS — I10 Essential (primary) hypertension: Secondary | ICD-10-CM | POA: Diagnosis present

## 2019-03-09 DIAGNOSIS — J1289 Other viral pneumonia: Secondary | ICD-10-CM | POA: Diagnosis present

## 2019-03-09 DIAGNOSIS — M109 Gout, unspecified: Secondary | ICD-10-CM | POA: Diagnosis present

## 2019-03-09 DIAGNOSIS — I132 Hypertensive heart and chronic kidney disease with heart failure and with stage 5 chronic kidney disease, or end stage renal disease: Secondary | ICD-10-CM | POA: Diagnosis present

## 2019-03-09 DIAGNOSIS — J9601 Acute respiratory failure with hypoxia: Secondary | ICD-10-CM | POA: Diagnosis present

## 2019-03-09 DIAGNOSIS — Z882 Allergy status to sulfonamides status: Secondary | ICD-10-CM

## 2019-03-09 DIAGNOSIS — Z6838 Body mass index (BMI) 38.0-38.9, adult: Secondary | ICD-10-CM

## 2019-03-09 DIAGNOSIS — I5032 Chronic diastolic (congestive) heart failure: Secondary | ICD-10-CM | POA: Diagnosis present

## 2019-03-09 DIAGNOSIS — Z841 Family history of disorders of kidney and ureter: Secondary | ICD-10-CM

## 2019-03-09 DIAGNOSIS — U071 COVID-19: Secondary | ICD-10-CM | POA: Diagnosis not present

## 2019-03-09 DIAGNOSIS — N186 End stage renal disease: Secondary | ICD-10-CM | POA: Diagnosis present

## 2019-03-09 DIAGNOSIS — M898X9 Other specified disorders of bone, unspecified site: Secondary | ICD-10-CM | POA: Diagnosis present

## 2019-03-09 DIAGNOSIS — E876 Hypokalemia: Secondary | ICD-10-CM | POA: Diagnosis present

## 2019-03-09 DIAGNOSIS — D539 Nutritional anemia, unspecified: Secondary | ICD-10-CM | POA: Diagnosis present

## 2019-03-09 NOTE — ED Triage Notes (Signed)
Pt says that he stood up at dialysis and he felt short of breath. Reports they checked his oxygen and it was in the 70's. Pt has had a cough, productive clear cough. Left sided chest pain with cough.

## 2019-03-09 NOTE — ED Triage Notes (Signed)
Pt arrives via EMS from dialysis center, c/o SOB and pain with cough. 98% on 3 liters. 120/79, hr 88, lung sounds clear. Pt was able to finish dialysis treatment.   Tested positive for covid Monday

## 2019-03-10 ENCOUNTER — Emergency Department (HOSPITAL_COMMUNITY): Payer: Commercial Managed Care - PPO

## 2019-03-10 ENCOUNTER — Encounter (HOSPITAL_COMMUNITY): Payer: Self-pay | Admitting: Family Medicine

## 2019-03-10 DIAGNOSIS — U071 COVID-19: Secondary | ICD-10-CM | POA: Diagnosis not present

## 2019-03-10 DIAGNOSIS — J1282 Pneumonia due to coronavirus disease 2019: Secondary | ICD-10-CM | POA: Diagnosis present

## 2019-03-10 DIAGNOSIS — D539 Nutritional anemia, unspecified: Secondary | ICD-10-CM | POA: Diagnosis present

## 2019-03-10 DIAGNOSIS — K5180 Other ulcerative colitis without complications: Secondary | ICD-10-CM

## 2019-03-10 DIAGNOSIS — J9601 Acute respiratory failure with hypoxia: Secondary | ICD-10-CM

## 2019-03-10 DIAGNOSIS — J1289 Other viral pneumonia: Secondary | ICD-10-CM

## 2019-03-10 DIAGNOSIS — N186 End stage renal disease: Secondary | ICD-10-CM

## 2019-03-10 DIAGNOSIS — I10 Essential (primary) hypertension: Secondary | ICD-10-CM | POA: Diagnosis not present

## 2019-03-10 LAB — LACTATE DEHYDROGENASE: LDH: 316 U/L — ABNORMAL HIGH (ref 98–192)

## 2019-03-10 LAB — CBC WITH DIFFERENTIAL/PLATELET
Abs Immature Granulocytes: 0.02 10*3/uL (ref 0.00–0.07)
Basophils Absolute: 0 10*3/uL (ref 0.0–0.1)
Basophils Relative: 0 %
Eosinophils Absolute: 0 10*3/uL (ref 0.0–0.5)
Eosinophils Relative: 1 %
HCT: 31.4 % — ABNORMAL LOW (ref 39.0–52.0)
Hemoglobin: 10.3 g/dL — ABNORMAL LOW (ref 13.0–17.0)
Immature Granulocytes: 0 %
Lymphocytes Relative: 14 %
Lymphs Abs: 0.7 10*3/uL (ref 0.7–4.0)
MCH: 34.1 pg — ABNORMAL HIGH (ref 26.0–34.0)
MCHC: 32.8 g/dL (ref 30.0–36.0)
MCV: 104 fL — ABNORMAL HIGH (ref 80.0–100.0)
Monocytes Absolute: 0.5 10*3/uL (ref 0.1–1.0)
Monocytes Relative: 9 %
Neutro Abs: 4 10*3/uL (ref 1.7–7.7)
Neutrophils Relative %: 76 %
Platelets: 206 10*3/uL (ref 150–400)
RBC: 3.02 MIL/uL — ABNORMAL LOW (ref 4.22–5.81)
RDW: 14.3 % (ref 11.5–15.5)
WBC: 5.3 10*3/uL (ref 4.0–10.5)
nRBC: 0 % (ref 0.0–0.2)

## 2019-03-10 LAB — COMPREHENSIVE METABOLIC PANEL
ALT: 20 U/L (ref 0–44)
AST: 31 U/L (ref 15–41)
Albumin: 3.5 g/dL (ref 3.5–5.0)
Alkaline Phosphatase: 21 U/L — ABNORMAL LOW (ref 38–126)
Anion gap: 15 (ref 5–15)
BUN: 15 mg/dL (ref 6–20)
CO2: 29 mmol/L (ref 22–32)
Calcium: 8.1 mg/dL — ABNORMAL LOW (ref 8.9–10.3)
Chloride: 92 mmol/L — ABNORMAL LOW (ref 98–111)
Creatinine, Ser: 6.39 mg/dL — ABNORMAL HIGH (ref 0.61–1.24)
GFR calc Af Amer: 10 mL/min — ABNORMAL LOW (ref >60)
GFR calc non Af Amer: 9 mL/min — ABNORMAL LOW (ref >60)
Glucose, Bld: 99 mg/dL (ref 70–99)
Potassium: 3.3 mmol/L — ABNORMAL LOW (ref 3.5–5.1)
Sodium: 136 mmol/L (ref 135–145)
Total Bilirubin: 0.9 mg/dL (ref 0.3–1.2)
Total Protein: 8 g/dL (ref 6.5–8.1)

## 2019-03-10 LAB — D-DIMER, QUANTITATIVE: D-Dimer, Quant: 1.87 ug/mL-FEU — ABNORMAL HIGH (ref 0.00–0.50)

## 2019-03-10 LAB — PROTIME-INR
INR: 0.9 (ref 0.8–1.2)
Prothrombin Time: 12.3 seconds (ref 11.4–15.2)

## 2019-03-10 LAB — PROCALCITONIN: Procalcitonin: 1.21 ng/mL

## 2019-03-10 LAB — HIV ANTIBODY (ROUTINE TESTING W REFLEX): HIV Screen 4th Generation wRfx: NONREACTIVE

## 2019-03-10 LAB — ABO/RH: ABO/RH(D): A POS

## 2019-03-10 LAB — SARS CORONAVIRUS 2 BY RT PCR (HOSPITAL ORDER, PERFORMED IN ~~LOC~~ HOSPITAL LAB): SARS Coronavirus 2: POSITIVE — AB

## 2019-03-10 LAB — C-REACTIVE PROTEIN: CRP: 10.5 mg/dL — ABNORMAL HIGH (ref <1.0)

## 2019-03-10 LAB — FERRITIN: Ferritin: 1449 ng/mL — ABNORMAL HIGH (ref 24–336)

## 2019-03-10 LAB — LACTIC ACID, PLASMA
Lactic Acid, Venous: 1.1 mmol/L (ref 0.5–1.9)
Lactic Acid, Venous: 1.8 mmol/L (ref 0.5–1.9)

## 2019-03-10 MED ORDER — GUAIFENESIN ER 600 MG PO TB12
600.0000 mg | ORAL_TABLET | Freq: Two times a day (BID) | ORAL | Status: DC
  Administered 2019-03-10 – 2019-03-20 (×20): 600 mg via ORAL
  Filled 2019-03-10 (×20): qty 1

## 2019-03-10 MED ORDER — HEPARIN SODIUM (PORCINE) 5000 UNIT/ML IJ SOLN
5000.0000 [IU] | Freq: Three times a day (TID) | INTRAMUSCULAR | Status: DC
  Administered 2019-03-10 – 2019-03-11 (×5): 5000 [IU] via SUBCUTANEOUS
  Filled 2019-03-10 (×5): qty 1

## 2019-03-10 MED ORDER — DICYCLOMINE HCL 10 MG PO CAPS
10.0000 mg | ORAL_CAPSULE | Freq: Three times a day (TID) | ORAL | Status: DC | PRN
  Administered 2019-03-10: 10 mg via ORAL
  Filled 2019-03-10 (×3): qty 1

## 2019-03-10 MED ORDER — ACETAMINOPHEN 325 MG PO TABS
650.0000 mg | ORAL_TABLET | Freq: Once | ORAL | Status: AC
  Administered 2019-03-10: 01:00:00 650 mg via ORAL

## 2019-03-10 MED ORDER — SEVELAMER CARBONATE 800 MG PO TABS
2400.0000 mg | ORAL_TABLET | Freq: Three times a day (TID) | ORAL | Status: DC
  Administered 2019-03-10 – 2019-03-20 (×26): 2400 mg via ORAL
  Filled 2019-03-10 (×30): qty 3

## 2019-03-10 MED ORDER — ACETAMINOPHEN 325 MG PO TABS
325.0000 mg | ORAL_TABLET | Freq: Once | ORAL | Status: DC
  Filled 2019-03-10: qty 1

## 2019-03-10 MED ORDER — ONDANSETRON 4 MG PO TBDP
4.0000 mg | ORAL_TABLET | Freq: Once | ORAL | Status: AC
  Administered 2019-03-10: 4 mg via ORAL
  Filled 2019-03-10: qty 1

## 2019-03-10 MED ORDER — SODIUM CHLORIDE 0.9% FLUSH
3.0000 mL | Freq: Once | INTRAVENOUS | Status: AC
  Administered 2019-03-11: 3 mL via INTRAVENOUS

## 2019-03-10 MED ORDER — ONDANSETRON HCL 4 MG/2ML IJ SOLN
4.0000 mg | Freq: Four times a day (QID) | INTRAMUSCULAR | Status: DC | PRN
  Administered 2019-03-10 – 2019-03-20 (×10): 4 mg via INTRAVENOUS
  Filled 2019-03-10 (×10): qty 2

## 2019-03-10 MED ORDER — ONDANSETRON HCL 4 MG/2ML IJ SOLN
4.0000 mg | Freq: Once | INTRAMUSCULAR | Status: DC
  Filled 2019-03-10: qty 2

## 2019-03-10 MED ORDER — PEPPERMINT OIL 90 MG PO CPCR
2.0000 | ORAL_CAPSULE | Freq: Three times a day (TID) | ORAL | Status: DC | PRN
  Administered 2019-03-10 – 2019-03-11 (×4): 2 via ORAL
  Filled 2019-03-10 (×4): qty 2

## 2019-03-10 MED ORDER — ONDANSETRON HCL 4 MG PO TABS
4.0000 mg | ORAL_TABLET | Freq: Four times a day (QID) | ORAL | Status: DC | PRN
  Administered 2019-03-11 – 2019-03-20 (×3): 4 mg via ORAL
  Filled 2019-03-10 (×3): qty 1

## 2019-03-10 MED ORDER — BENZONATATE 100 MG PO CAPS
100.0000 mg | ORAL_CAPSULE | Freq: Three times a day (TID) | ORAL | Status: DC
  Administered 2019-03-10 – 2019-03-20 (×30): 100 mg via ORAL
  Filled 2019-03-10 (×31): qty 1

## 2019-03-10 MED ORDER — ACETAMINOPHEN 325 MG PO TABS
650.0000 mg | ORAL_TABLET | Freq: Four times a day (QID) | ORAL | Status: DC | PRN
  Administered 2019-03-10 – 2019-03-12 (×4): 650 mg via ORAL
  Filled 2019-03-10 (×4): qty 2

## 2019-03-10 NOTE — ED Notes (Signed)
MD at bedside. 

## 2019-03-10 NOTE — ED Notes (Signed)
Attempted report 

## 2019-03-10 NOTE — Progress Notes (Signed)
[facility] PLAN OF CARE NOTE Patient: Colin [name] QJE:[id number]   PCP: Gaynelle Arabian, MD DOB: Oct 25, 1965   DOA: 03/09/2019   DOS: 03/10/2019    Patient was admitted by my colleague Dr. Myna Hidalgo earlier on 03/10/2019. I have reviewed the H&P as well as assessment and plan and agree with the same. Important changes in the plan are listed below.  Plan of care: Principal Problem:   Pneumonia due to COVID-19 virus Active Problems:   Essential hypertension   Ulcerative colitis ([place])   ESRD (end stage renal disease) ([place])   Macrocytic anemia Continue close monitoring.  Continue routine GI symptoms of cramping.  Author: Berle Mull, MD [facility] 03/10/2019 4:39 PM   If 7PM-7AM, please contact night-coverage at [email or web address]

## 2019-03-10 NOTE — ED Notes (Signed)
Admitting MD at bedside.

## 2019-03-10 NOTE — ED Notes (Signed)
Patient aware that we need urine sample for testing, unable at this time. Pt given instruction on providing urine sample when able to do so.

## 2019-03-10 NOTE — H&P (Signed)
History and Physical    Brenen Beigel JSR:[id number] DOB: 1966-01-18 DOA: 03/09/2019  PCP: Gaynelle Arabian, MD   Patient coming from: Home   Chief Complaint: SOB  HPI: Colin [name] is a 53 y.o. male with medical history significant for ulcerative colitis, end-stage renal disease on hemodialysis, and hypertension, now presenting to the emergency department for evaluation of shortness of breath with hypoxia at his dialysis center and positive COVID-19 testing on 03/05/2019.  Patient reports developing subjective fevers and chills just over a week ago, may have had some slight dyspnea at that time, as well as a mild cough and some sinus pressure.  He was evaluated in the emergency department on 03/05/2019, was febrile at the time, found to have COVID-19, but was not in any respiratory distress and not hypoxic, and he returned home.  He reports slowly progressive worsening in his dyspnea, and reports that he has been short of breath at rest over the past 1 to 2 days.  He went to dialysis today, completed his session, but complained of severe dyspnea and was found to be hypoxic on room air.  He reports some mild cramping abdominal discomfort, but no pain per se, reports some chest discomfort at the left lateral chest wall when he coughs, has had some slight clear mucus production with his cough, and denies any lower extremity swelling or tenderness.  He denies any rash.  He does not know of any sick contacts.  ED Course: Upon arrival to the ED, patient is found to be febrile to 38.3 C, saturating 85% on room air, tachypneic, and with low-normal blood pressure.  EKG features a sinus rhythm and chest x-ray is concerning for mild patchy bilateral opacities, right greater than left.  Chemistry panel features a mild hypokalemia and CBC is notable for stable chronic macrocytic anemia.  Blood cultures were collected in the emergency department, acetaminophen was administered, and the patient was placed on 2 L/min of  supplemental oxygen.  Patient does not appear to be in any significant distress while at rest, but has a new oxygen requirement, requiring 2 L/min while lying in bed, and he will be brought into the hospital for ongoing evaluation and management.  Review of Systems:  All other systems reviewed and apart from HPI, are negative.  Past Medical History:  Diagnosis Date   Anemia    iron deficiency   Arthritis    Gout   CKD (chronic kidney disease) stage 3, GFR 30-59 ml/min (HCC)    Stage 4   Gout    History of chicken pox    Hypertension    Melanosis coli    Mumps    Pneumonia    Ulcerative colitis [place], [place]    Past Surgical History:  Procedure Laterality Date   AV FISTULA PLACEMENT Right 08/27/2016   Procedure: RIGHT RADIOCEPHALIC ARTERIOVENOUS FISTULA CREATION;  Surgeon: [name], MD;  Location: [facility];  Service: Vascular;  Laterality: Right;   AV FISTULA PLACEMENT Right 10/21/2017   Procedure: ARTERIOVENOUS (AV) FISTULA CREATION RIGHT ARM;  Surgeon: [name], MD;  Location: [facility];  Service: Vascular;  Laterality: Right;   COLONOSCOPY     FISTULA SUPERFICIALIZATION Right 11/12/2016   Procedure: FISTULA SUPERFICIALIZATION RIGHT FOREEARM;  Surgeon: [name], MD;  Location: [facility];  Service: Vascular;  Laterality: Right;   REVISON OF ARTERIOVENOUS FISTULA Right 12/23/2017   Procedure: CEPHALIC VEIN TRANSPOSITION ARTERIOVENOUS FISTULA RIGHT UPPER ARM;  Surgeon: [name],  MD;  Location: [facility];  Service: Vascular;  Laterality: Right;   TENDON REPAIR  1987   Left index finger     reports that he has never smoked. He has quit using smokeless tobacco.  His smokeless tobacco use included chew. He reports current alcohol use. He reports that he does not use drugs.  Allergies  Allergen Reactions   Sulfa Antibiotics Shortness Of Breath and Rash    Family History  Problem Relation Age of Onset   Hypertension Father    Heart disease Father     Renal Disease Father    Hypertension Mother      Prior to Admission medications   Medication Sig Start Date End Date Taking? Authorizing Provider  acetaminophen (TYLENOL) 500 MG tablet Take 500 mg by mouth every 6 (six) hours as needed for mild pain.    [provider]  allopurinol (ZYLOPRIM) 100 MG tablet Take 150 mg by mouth daily.    [provider]  amLODipine (NORVASC) 10 MG tablet Take 10 mg by mouth at bedtime.     [provider]  atenolol (TENORMIN) 50 MG tablet Take 100 mg by mouth at bedtime. 09/04/17   [provider]  bromocriptine (PARLODEL) 2.5 MG tablet Take 1 tablet (2.5 mg total) by mouth at bedtime. Patient not taking: Reported on 10/27/2018 01/17/18   Renato Shin, MD  CALCITRIOL PO Take 3 capsules by mouth every Monday, Wednesday, and Friday. Only on dialysis days    [provider]  cyclobenzaprine (FLEXERIL) 10 MG tablet Take 1 tablet (10 mg total) by mouth 2 (two) times daily as needed for muscle spasms. Patient not taking: Reported on 10/27/2018 08/15/18   Wieters, Hallie C, PA-C  diclofenac sodium (VOLTAREN) 1 % GEL Apply 2 g topically 4 (four) times daily. Patient not taking: Reported on 10/27/2018 08/15/18   Wieters, Hallie C, PA-C  ferrous sulfate 325 (65 FE) MG tablet Take 325 mg by mouth at bedtime.    [provider]  HUMIRA PEN 40 MG/0.4ML PNKT Inject 40 mg into the muscle every 14 (fourteen) days.  11/22/17   [provider]  hydrALAZINE (APRESOLINE) 50 MG tablet Take 1 tablet (50 mg total) by mouth at bedtime. Patient not taking: Reported on 10/27/2018 08/27/16   Alvia Grove, PA-C  HYDROcodone-acetaminophen (NORCO) 5-325 MG tablet Take 1 tablet by mouth every 6 (six) hours as needed for moderate pain. Patient not taking: Reported on 01/09/2019 12/23/17   Dagoberto Ligas, PA-C  lidocaine (XYLOCAINE) 5 % ointment Apply 1 application topically 4 (four) times daily as needed. Patient not taking:  Reported on 01/09/2019 10/27/18   Virgel Manifold, MD  multivitamin (RENA-VIT) TABS tablet Take 1 tablet by mouth at bedtime. 07/24/18   [provider]  ondansetron (ZOFRAN ODT) 4 MG disintegrating tablet Take 1 tablet (4 mg total) by mouth every 8 (eight) hours as needed for nausea or vomiting. 03/05/19   Albrizze, Verline Lema E, PA-C  pravastatin (PRAVACHOL) 40 MG tablet Take 40 mg by mouth at bedtime.     [provider]  psyllium (METAMUCIL SMOOTH TEXTURE) 58.6 % powder Take 1 packet by mouth 3 (three) times daily. Patient not taking: Reported on 03/05/2019 10/27/18   Virgel Manifold, MD  sevelamer carbonate (RENVELA) 800 MG tablet Take 2,400 mg by mouth 3 (three) times daily with meals.     [provider]  traMADol (ULTRAM) 50 MG tablet Take 1 tablet (50 mg total) by mouth every 12 (twelve) hours  as needed. Patient not taking: Reported on 10/27/2018 08/15/18   Janith Lima, PA-C    Physical Exam: Vitals:   03/10/19 0023 03/10/19 0058 03/10/19 0100 03/10/19 0115  BP:   109/61 (!) 124/45  Resp:  (!) 22 (!) 25 (!) 29  Temp:      TempSrc:      SpO2: (S) (!) 85% 100% 100% 100%  Weight:      Height:        Constitutional: NAD, calm  Eyes: PERTLA, lids and conjunctivae normal ENMT: Mucous membranes are moist. Posterior pharynx clear of any exudate or lesions.   Neck: normal, supple, no masses, no thyromegaly Respiratory: rhonchi bilaterally. Mild tachypnea. No accessory muscle use.  Cardiovascular: S1 & S2 heard, regular rate and rhythm. No leg swelling.  Abdomen: No distension, no tenderness, soft. Bowel sounds active.  Musculoskeletal: no clubbing / cyanosis. No joint deformity upper and lower extremities.  Skin: no significant rashes, lesions, ulcers. Warm, dry, well-perfused. Neurologic: CN 2-12 grossly intact. Sensation intact. Strength 5/5 in all 4 limbs.  Psychiatric: Alert and oriented x 3. Pleasant, cooperative.    Labs on Admission: I have personally  reviewed following labs and imaging studies  CBC: Recent Labs  Lab 03/05/19 1004 03/10/19 0016  WBC 6.6 5.3  NEUTROABS 4.5 4.0  HGB 9.4* 10.3*  HCT 28.6* 31.4*  MCV 105.5* 104.0*  PLT 229 177   Basic Metabolic Panel: Recent Labs  Lab 03/05/19 1004 03/10/19 0016  NA 135 136  K 4.2 3.3*  CL 97* 92*  CO2 26 29  GLUCOSE 110* 99  BUN 53* 15  CREATININE 12.66* 6.39*  CALCIUM 9.8 8.1*   GFR: Estimated Creatinine Clearance: 14.7 mL/min (A) (by C-G formula based on SCr of 6.39 mg/dL (H)). Liver Function Tests: Recent Labs  Lab 03/10/19 0016  AST 31  ALT 20  ALKPHOS 21*  BILITOT 0.9  PROT 8.0  ALBUMIN 3.5   No results for input(s): LIPASE, AMYLASE in the last 168 hours. No results for input(s): AMMONIA in the last 168 hours. Coagulation Profile: Recent Labs  Lab 03/10/19 0016  INR 0.9   Cardiac Enzymes: No results for input(s): CKTOTAL, CKMB, CKMBINDEX, TROPONINI in the last 168 hours. BNP (last 3 results) No results for input(s): PROBNP in the last 8760 hours. HbA1C: No results for input(s): HGBA1C in the last 72 hours. CBG: No results for input(s): GLUCAP in the last 168 hours. Lipid Profile: No results for input(s): CHOL, HDL, LDLCALC, TRIG, CHOLHDL, LDLDIRECT in the last 72 hours. Thyroid Function Tests: No results for input(s): TSH, T4TOTAL, FREET4, T3FREE, THYROIDAB in the last 72 hours. Anemia Panel: No results for input(s): VITAMINB12, FOLATE, FERRITIN, TIBC, IRON, RETICCTPCT in the last 72 hours. Urine analysis:    Component Value Date/Time   COLORURINE YELLOW 10/21/2017 0715   APPEARANCEUR CLEAR 10/21/2017 0715   LABSPEC 1.011 10/21/2017 0715   PHURINE 6.0 10/21/2017 0715   GLUCOSEU NEGATIVE 10/21/2017 0715   HGBUR NEGATIVE 10/21/2017 0715   BILIRUBINUR NEGATIVE 10/21/2017 0715   KETONESUR NEGATIVE 10/21/2017 0715   PROTEINUR >=300 (A) 10/21/2017 0715   UROBILINOGEN 0.2 11/14/2010 1927   NITRITE NEGATIVE 10/21/2017 0715   LEUKOCYTESUR  NEGATIVE 10/21/2017 0715   Sepsis Labs: @LABRCNTIP (procalcitonin:4,lacticidven:4) ) Recent Results (from the past 240 hour(s))  SARS Coronavirus 2 (CEPHEID- Performed in [facility] hospital lab), Hosp Order     Status: Abnormal   Collection Time: 03/05/19  8:58 AM  Result Value Ref Range Status  SARS Coronavirus 2 POSITIVE (A) NEGATIVE Final    Comment: RESULT CALLED TO, READ BACK BY AND VERIFIED WITH: Marzella Schlein RN 11:20 03/05/19 ([email or web address]) (NOTE) If result is NEGATIVE SARS-CoV-2 target nucleic acids are NOT DETECTED. The SARS-CoV-2 RNA is generally detectable in upper and lower  respiratory specimens during the acute phase of infection. The lowest  concentration of SARS-CoV-2 viral copies this assay can detect is 250  copies / mL. A negative result does not preclude SARS-CoV-2 infection  and should not be used as the sole basis for treatment or other  patient management decisions.  A negative result may occur with  improper specimen collection / handling, submission of specimen other  than nasopharyngeal swab, presence of viral mutation(s) within the  areas targeted by this assay, and inadequate number of viral copies  (<250 copies / mL). A negative result must be combined with clinical  observations, patient history, and epidemiological information. If result is POSITIVE SARS-CoV-2 target nucleic acids are DETECTED.  The SARS-CoV-2 RNA is generally detectable in upper and lower  respiratory specimens during the acute phase of infection.  Positive  results are indicative of active infection with SARS-CoV-2.  Clinical  correlation with patient history and other diagnostic information is  necessary to determine patient infection status.  Positive results do  not rule out bacterial infection or co-infection with other viruses. If result is PRESUMPTIVE POSTIVE SARS-CoV-2 nucleic acids MAY BE PRESENT.   A presumptive positive result was obtained on the submitted specimen  and  confirmed on repeat testing.  While 2019 novel coronavirus  (SARS-CoV-2) nucleic acids may be present in the submitted sample  additional confirmatory testing may be necessary for epidemiological  and / or clinical management purposes  to differentiate between  SARS-CoV-2 and other Sarbecovirus currently known to infect humans.  If clinically indicated additional testing with an alternate test  methodology [phone]) i s advised. The SARS-CoV-2 RNA is generally  detectable in upper and lower respiratory specimens during the acute  phase of infection. The expected result is Negative. Fact Sheet for Patients:  StrictlyIdeas.no Fact Sheet for Healthcare Providers: BankingDealers.co.za This test is not yet approved or cleared by the [place] FDA and has been authorized for detection and/or diagnosis of SARS-CoV-2 by FDA under an Emergency Use Authorization (EUA).  This EUA will remain in effect (meaning this test can be used) for the duration of the COVID-19 declaration under Section 564(b)(1) of the Act, 21 U.S.C. section 360bbb-3(b)(1), unless the authorization is terminated or revoked sooner. Performed at [facility], [place] [address], [place], [place] [address]      Radiological Exams on Admission: Dg Chest Portable 1 View  Result Date: 03/10/2019 CLINICAL DATA:  Shortness of breath, positive COVID-19 EXAM: PORTABLE CHEST 1 VIEW COMPARISON:  03/05/2019 FINDINGS: Cardiac shadow is mildly prominent but accentuated by the portable technique. Patchy densities are identified throughout both lungs predominately within the bases but laterally on the right in the mid lung new from the prior exam. These changes would correspond with the given clinical history of positive COVID-19. No sizable effusion is noted. No bony abnormality is seen. IMPRESSION: Mild patchy infiltrates bilaterally right greater than left. Electronically Signed   By: Inez Catalina M.D.   On: 03/10/2019 00:58    EKG: Independently reviewed. Sinus rhythm.   Assessment/Plan   1. Pneumonia secondary to COVID-19; acute hypoxic respiratory failure  - Presents with ~1 wk of f/c, cough, malaise, and SOB that worsened over  the past 1-2 days  - He complained of SOB at HD, was found to be hypoxic, and sent to ED  - COVID-19 was diagnosed on 03/05/19  - Continue supplemental O2 as-needed, continue supportive care, droplet and contact precautions, follow-up pending labs   2. ESRD  - Patient completed HD on 03/09/19 and there are no indications for urgent HD on admission   3. Hypertension  - BP low-normal in ED and scheduled antihypertensives held initially    4. Ulcerative colitis  - Patient reports some mild cramping abdominal discomfort, but no pain, bleeding, or diarrhea - Per patient, his gastroenterologist was made aware of his COVID-19 diagnosis and wants to hold Humira for now   5. Anemia  - H&H stable, patient reports chronically dark stool that he attributes to his iron supplements, no hematochezia    PPE: Mask, face shield, gown, gloves. Patient wearing mask.  DVT prophylaxis: sq heparin  Code Status: Full  Family Communication: Discussed with patient  Consults called: None Admission status: Observation    Vianne Bulls, MD [facility] Pager [phone]  If 7PM-7AM, please contact night-coverage [email or web address] Password [email or web address]  03/10/2019, 1:57 AM

## 2019-03-10 NOTE — ED Notes (Signed)
Pt reports he is having abdominal cramping, says he talked to his GI doctor and they wanted him to get IB guard, wants to see if it can be ordered here.

## 2019-03-10 NOTE — ED Notes (Signed)
Report called  

## 2019-03-10 NOTE — ED Provider Notes (Signed)
Emergency Department Provider Note   I have reviewed the triage vital signs and the nursing notes.   HISTORY  Chief Complaint Shortness of Breath and COVID +   HPI Colin [name] is a 53 y.o. male recently diagnosed with COVID who presents to the ED today with worsenign fever, malaise, cough,sob and chills over last 24 hours. Full dialysis today, stated sats of 70? There, started on oxygen, ems brought here. No other cp, le swelling or complaints.    No other associated or modifying symptoms.    Past Medical History:  Diagnosis Date  . Anemia    iron deficiency  . Arthritis    Gout  . CKD (chronic kidney disease) stage 3, GFR 30-59 ml/min (HCC)    Stage 4  . Gout   . History of chicken pox   . Hypertension   . Melanosis coli   . Mumps   . Pneumonia   . Ulcerative colitis [place], [place]    Patient Active Problem List   Diagnosis Date Noted  . Pneumonia due to COVID-19 virus 03/10/2019  . Macrocytic anemia 03/10/2019  . Hyperprolactinemia ([place]) 01/17/2018  . Screening for malignant neoplasm of prostate 01/17/2018  . Gynecomastia 01/17/2018  . Ingrown toenail 06/21/2017  . Onychomycosis 06/21/2017  . ESRD (end stage renal disease) ([place]) 02/07/2017  . Morbid obesity ([place]) 02/07/2017  . Bilateral leg edema 10/02/2015  . Hyperlipidemia 10/02/2015  . Edema 02/09/2011  . Chronic renal insufficiency 02/09/2011  . ANEMIA, IRON DEFICIENCY 10/16/2007  . Essential hypertension 10/16/2007  . Ulcerative colitis ([place]) 02/13/2007  . MELANOSIS COLI 02/13/2007    Past Surgical History:  Procedure Laterality Date  . AV FISTULA PLACEMENT Right 08/27/2016   Procedure: RIGHT RADIOCEPHALIC ARTERIOVENOUS FISTULA CREATION;  Surgeon: [name], MD;  Location: [facility] OR;  Service: Vascular;  Laterality: Right;  . AV FISTULA PLACEMENT Right 10/21/2017   Procedure: ARTERIOVENOUS (AV) FISTULA CREATION RIGHT ARM;  Surgeon: [name], MD;  Location: [place];  Service:  Vascular;  Laterality: Right;  . COLONOSCOPY    . FISTULA SUPERFICIALIZATION Right 11/12/2016   Procedure: FISTULA SUPERFICIALIZATION RIGHT FOREEARM;  Surgeon: [name], MD;  Location: [facility] OR;  Service: Vascular;  Laterality: Right;  . REVISON OF ARTERIOVENOUS FISTULA Right 12/23/2017   Procedure: CEPHALIC VEIN TRANSPOSITION ARTERIOVENOUS FISTULA RIGHT UPPER ARM;  Surgeon: [name], MD;  Location: [facility] OR;  Service: Vascular;  Laterality: Right;  . TENDON REPAIR  1987   Left index finger    Current Outpatient Rx  . Order #: [id number] Class: Historical Med  . Order #: [id number] Class: Historical Med  . Order #: [id number] Class: Historical Med  . Order #: [id number] Class: Historical Med  . Order #: [id number] Class: Normal  . Order #: [id number] Class: Historical Med  . Order #: [id number] Class: Normal  . Order #: [id number] Class: Normal  . Order #: [id number] Class: Historical Med  . Order #: [id number] Class: Historical Med  . Order #: [id number] Class: No Print  . Order #: [id number] Class: Print  . Order #: [id number] Class: Print  . Order #: [id number] Class: Historical Med  . Order #: [id number] Class: Print  . Order #: [id number] Class: Historical Med  . Order #: [id number] Class: Normal  . Order #: [id number] Class: Historical Med  . Order #: [id number] Class: Normal    Allergies Sulfa antibiotics  Family History  Problem Relation Age of Onset  . Hypertension Father   . Heart disease Father   . Renal Disease Father   . Hypertension Mother  Social History Social History   Tobacco Use  . Smoking status: Never Smoker  . Smokeless tobacco: Former Systems developer    Types: Chew  Substance Use Topics  . Alcohol use: Yes    Comment: rare  . Drug use: No    Review of Systems  All other systems negative except as documented in the HPI. All pertinent positives and negatives as reviewed in the HPI. ____________________________________________   PHYSICAL EXAM:  VITAL SIGNS: ED Triage Vitals   Enc Vitals Group     BP 03/09/19 2357 119/75     Pulse --      Resp 03/09/19 2357 18     Temp 03/09/19 2357 (!) 100.9 F (38.3 C)     Temp Source 03/09/19 2357 Oral     SpO2 03/09/19 2357 98 %     Weight 03/09/19 2353 229 lb (103.9 kg)     Height 03/09/19 2353 5' 5"  (1.651 m)    Constitutional: Alert and oriented. Well appearing and in no acute distress. Eyes: Conjunctivae are normal. PERRL. EOMI. Head: Atraumatic. Nose: No congestion/rhinnorhea. Mouth/Throat: Mucous membranes are moist.  Oropharynx non-erythematous. Neck: No stridor.  No meningeal signs.   Cardiovascular: Normal rate, regular rhythm. Good peripheral circulation. Grossly normal heart sounds.   Respiratory: tachypneic respiratory effort.  No retractions. Lungs CTAB. Hypoxic. Gastrointestinal: Soft and nontender. No distention.  Musculoskeletal: No lower extremity tenderness nor edema. No gross deformities of extremities. Neurologic:  Normal speech and language. No gross focal neurologic deficits are appreciated.  Skin:  Skin is warm, dry and intact. No rash noted.   ____________________________________________   LABS (all labs ordered are listed, but only abnormal results are displayed)  Labs Reviewed  COMPREHENSIVE METABOLIC PANEL - Abnormal; Notable for the following components:      Result Value   Potassium 3.3 (*)    Chloride 92 (*)    Creatinine, Ser 6.39 (*)    Calcium 8.1 (*)    Alkaline Phosphatase 21 (*)    GFR calc non Af Amer 9 (*)    GFR calc Af Amer 10 (*)    All other components within normal limits  CBC WITH DIFFERENTIAL/PLATELET - Abnormal; Notable for the following components:   RBC 3.02 (*)    Hemoglobin 10.3 (*)    HCT 31.4 (*)    MCV 104.0 (*)    MCH 34.1 (*)    All other components within normal limits  CULTURE, BLOOD (ROUTINE X 2)  CULTURE, BLOOD (ROUTINE X 2)  LACTIC ACID, PLASMA  PROTIME-INR  LACTIC ACID, PLASMA  URINALYSIS, ROUTINE W REFLEX MICROSCOPIC    ____________________________________________  EKG   EKG Interpretation  Date/Time:  Friday Mar 09 2019 23:56:56 EDT Ventricular Rate:  87 PR Interval:    QRS Duration: 84 QT Interval:  384 QTC Calculation: 462 R Axis:   73 Text Interpretation:  Sinus rhythm Baseline wander in lead(s) V1 No significant change since last tracing Confirmed by Merrily Pew [phone]) on 03/10/2019 1:29:59 AM       ____________________________________________  RADIOLOGY  Dg Chest Portable 1 View  Result Date: 03/10/2019 CLINICAL DATA:  Shortness of breath, positive COVID-19 EXAM: PORTABLE CHEST 1 VIEW COMPARISON:  03/05/2019 FINDINGS: Cardiac shadow is mildly prominent but accentuated by the portable technique. Patchy densities are identified throughout both lungs predominately within the bases but laterally on the right in the mid lung new from the prior exam. These changes would correspond with the given clinical history of positive COVID-19. No sizable  effusion is noted. No bony abnormality is seen. IMPRESSION: Mild patchy infiltrates bilaterally right greater than left. Electronically Signed   By: Inez Catalina M.D.   On: 03/10/2019 00:58    ____________________________________________   PROCEDURES  Procedure(s) performed:   .Critical Care Performed by: Merrily Pew, MD Authorized by: Merrily Pew, MD   Critical care provider statement:    Critical care time (minutes):  45   Critical care was necessary to treat or prevent imminent or life-threatening deterioration of the following conditions:  Respiratory failure   Critical care was time spent personally by me on the following activities:  Discussions with consultants, evaluation of patient's response to treatment, examination of patient, ordering and performing treatments and interventions, ordering and review of laboratory studies, ordering and review of radiographic studies, pulse oximetry, re-evaluation of patient's condition, obtaining  history from patient or surrogate and review of old charts   I assumed direction of critical care for this patient from another provider in my specialty: no       ____________________________________________   INITIAL IMPRESSION / ASSESSMENT AND PLAN / ED COURSE  Hypoxia here to 86% on RA at rest. covid positive. No LE edema, S3 or JVD to suggest pulmonary edema. O2 with sats at 100%. Tylenol for fever. Plan for admission.   Pertinent labs & imaging results that were available during my care of the patient were reviewed by me and considered in my medical decision making (see chart for details). ____________________________________________  FINAL CLINICAL IMPRESSION(S) / ED DIAGNOSES  Final diagnoses:  Hypoxia  COVID-19     MEDICATIONS GIVEN DURING THIS VISIT:  Medications  sodium chloride flush (NS) 0.9 % injection 3 mL (has no administration in time range)  acetaminophen (TYLENOL) tablet 650 mg (650 mg Oral Given 03/10/19 0041)  ondansetron (ZOFRAN-ODT) disintegrating tablet 4 mg (4 mg Oral Given 03/10/19 0050)     NEW OUTPATIENT MEDICATIONS STARTED DURING THIS VISIT:  New Prescriptions   No medications on file    Note:  This note was prepared with assistance of Dragon voice recognition software. Occasional wrong-word or sound-a-like substitutions may have occurred due to the inherent limitations of voice recognition software.   Tyson Masin, Corene Cornea, MD 03/10/19 0157

## 2019-03-10 NOTE — ED Notes (Signed)
ED TO INPATIENT HANDOFF REPORT  ED Nurse Name and Phone #: Mali [phone]  S Name/Age/Gender Colin [name] 53 y.o. male Room/Bed: RESUSC/RESUSC  Code Status   Code Status: Full Code  Home/SNF/Other Home Patient oriented to: self, place, time and situation Is this baseline? Yes   Triage Complete: Triage complete  Chief Complaint sob  Triage Note Pt arrives via EMS from dialysis center, c/o SOB and pain with cough. 98% on 3 liters. 120/79, hr 88, lung sounds clear. Pt was able to finish dialysis treatment.   Tested positive for covid Monday  Pt says that he stood up at dialysis and he felt short of breath. Reports they checked his oxygen and it was in the 70's. Pt has had a cough, productive clear cough. Left sided chest pain with cough.    Allergies Allergies  Allergen Reactions  . Sulfa Antibiotics Shortness Of Breath and Rash    Level of Care/Admitting Diagnosis ED Disposition    ED Disposition Condition Comment   Admit  Hospital Area: [facility] [[id number]]  Level of Care: Med-Surg [16]  I expect the patient will be discharged within 24 hours: No (not a candidate for 5C-Observation unit)  Covid Evaluation: Confirmed COVID Positive  Isolation Risk Level: Low Risk/Droplet (Less than 4L [place] supplementation)  Diagnosis: Pneumonia due to COVID-19 virus [[id number]]  Admitting Physician: Vianne Bulls [[id number]]  Attending Physician: Vianne Bulls [[id number]]  PT Class (Do Not Modify): Observation [104]  PT Acc Code (Do Not Modify): Observation [10022]       B Medical/Surgery History Past Medical History:  Diagnosis Date  . Anemia    iron deficiency  . Arthritis    Gout  . CKD (chronic kidney disease) stage 3, GFR 30-59 ml/min (HCC)    Stage 4  . Gout   . History of chicken pox   . Hypertension   . Melanosis coli   . Mumps   . Pneumonia   . Ulcerative colitis [place], [place]   Past Surgical History:  Procedure Laterality Date  .  AV FISTULA PLACEMENT Right 08/27/2016   Procedure: RIGHT RADIOCEPHALIC ARTERIOVENOUS FISTULA CREATION;  Surgeon: [name], MD;  Location: [facility] OR;  Service: Vascular;  Laterality: Right;  . AV FISTULA PLACEMENT Right 10/21/2017   Procedure: ARTERIOVENOUS (AV) FISTULA CREATION RIGHT ARM;  Surgeon: [name], MD;  Location: [facility];  Service: Vascular;  Laterality: Right;  . COLONOSCOPY    . FISTULA SUPERFICIALIZATION Right 11/12/2016   Procedure: FISTULA SUPERFICIALIZATION RIGHT FOREEARM;  Surgeon: [name], MD;  Location: [facility] OR;  Service: Vascular;  Laterality: Right;  . REVISON OF ARTERIOVENOUS FISTULA Right 12/23/2017   Procedure: CEPHALIC VEIN TRANSPOSITION ARTERIOVENOUS FISTULA RIGHT UPPER ARM;  Surgeon: [name], MD;  Location: [facility] OR;  Service: Vascular;  Laterality: Right;  . TENDON REPAIR  1987   Left index finger     A IV Location/Drains/Wounds Patient Lines/Drains/Airways Status   Active Line/Drains/Airways    Name:   Placement date:   Placement time:   Site:   Days:   Peripheral IV 03/10/19 Left;Upper Arm   03/10/19    0203    Arm   less than 1   Fistula / Graft Right Forearm Arteriovenous fistula   08/27/16    0838    Forearm   925          Intake/Output Last 24 hours No intake or output data in the 24 hours  ending 03/10/19 0742  Labs/Imaging Results for orders placed or performed during the hospital encounter of 03/09/19 (from the past 48 hour(s))  Comprehensive metabolic panel     Status: Abnormal   Collection Time: 03/10/19 12:16 AM  Result Value Ref Range   Sodium 136 135 - 145 mmol/L   Potassium 3.3 (L) 3.5 - 5.1 mmol/L   Chloride 92 (L) 98 - 111 mmol/L   CO2 29 22 - 32 mmol/L   Glucose, Bld 99 70 - 99 mg/dL   BUN 15 6 - 20 mg/dL   Creatinine, Ser 6.39 (H) 0.61 - 1.24 mg/dL   Calcium 8.1 (L) 8.9 - 10.3 mg/dL   Total Protein 8.0 6.5 - 8.1 g/dL   Albumin 3.5 3.5 - 5.0 g/dL   AST 31 15 - 41 U/L   ALT 20 0 - 44 U/L   Alkaline Phosphatase 21 (L) 38 -  126 U/L   Total Bilirubin 0.9 0.3 - 1.2 mg/dL   GFR calc non Af Amer 9 (L) >60 mL/min   GFR calc Af Amer 10 (L) >60 mL/min   Anion gap 15 5 - 15    Comment: Performed at [facility], [address], [place], [place] [address]  Lactic acid, plasma     Status: None   Collection Time: 03/10/19 12:16 AM  Result Value Ref Range   Lactic Acid, Venous 1.8 0.5 - 1.9 mmol/L    Comment: Performed at [facility] [address], [place], [place] [address]  CBC with Differential     Status: Abnormal   Collection Time: 03/10/19 12:16 AM  Result Value Ref Range   WBC 5.3 4.0 - 10.5 K/uL   RBC 3.02 (L) 4.22 - 5.81 MIL/uL   Hemoglobin 10.3 (L) 13.0 - 17.0 g/dL   HCT 31.4 (L) 39.0 - 52.0 %   MCV 104.0 (H) 80.0 - 100.0 fL   MCH 34.1 (H) 26.0 - 34.0 pg   MCHC 32.8 30.0 - 36.0 g/dL   RDW 14.3 11.5 - 15.5 %   Platelets 206 150 - 400 K/uL   nRBC 0.0 0.0 - 0.2 %   Neutrophils Relative % 76 %   Neutro Abs 4.0 1.7 - 7.7 K/uL   Lymphocytes Relative 14 %   Lymphs Abs 0.7 0.7 - 4.0 K/uL   Monocytes Relative 9 %   Monocytes Absolute 0.5 0.1 - 1.0 K/uL   Eosinophils Relative 1 %   Eosinophils Absolute 0.0 0.0 - 0.5 K/uL   Basophils Relative 0 %   Basophils Absolute 0.0 0.0 - 0.1 K/uL   Immature Granulocytes 0 %   Abs Immature Granulocytes 0.02 0.00 - 0.07 K/uL    Comment: Performed at [facility], [address], [place], [place] [address]  Protime-INR     Status: None   Collection Time: 03/10/19 12:16 AM  Result Value Ref Range   Prothrombin Time 12.3 11.4 - 15.2 seconds   INR 0.9 0.8 - 1.2    Comment: (NOTE) INR goal varies based on device and disease states. Performed at [facility], [place] [address], [place], [place] [address]   C-reactive protein     Status: Abnormal   Collection Time: 03/10/19  1:55 AM  Result Value Ref Range   CRP 10.5 (H) <1.0 mg/dL    Comment: Performed at [facility] [address], [place], [place] [address]  D-dimer, quantitative  (not at [facility])     Status:  Abnormal   Collection Time: 03/10/19  1:55 AM  Result Value Ref Range   D-Dimer, Quant 1.87 (H) 0.00 - 0.50 ug/mL-FEU    Comment: (NOTE) At the manufacturer cut-off of 0.50 ug/mL FEU, this assay has been documented to exclude PE with a sensitivity and negative predictive value of 97 to 99%.  At this time, this assay has not been approved by the FDA to exclude DVT/VTE. Results should be correlated with clinical presentation. Performed at [facility], [place] [address] [place], [place] [address]   Ferritin     Status: Abnormal   Collection Time: 03/10/19  1:55 AM  Result Value Ref Range   Ferritin 1,449 (H) 24 - 336 ng/mL    Comment: Performed at [facility], [place] [address], [place], [place] [address]  Procalcitonin     Status: None   Collection Time: 03/10/19  1:55 AM  Result Value Ref Range   Procalcitonin 1.21 ng/mL    Comment:        Interpretation: PCT > 0.5 ng/mL and <= 2 ng/mL: Systemic infection (sepsis) is possible, but other conditions are known to elevate PCT as well. (NOTE)       Sepsis PCT Algorithm           Lower Respiratory Tract                                      Infection PCT Algorithm    ----------------------------     ----------------------------         PCT < 0.25 ng/mL                PCT < 0.10 ng/mL         Strongly encourage             Strongly discourage   discontinuation of antibiotics    initiation of antibiotics    ----------------------------     -----------------------------       PCT 0.25 - 0.50 ng/mL            PCT 0.10 - 0.25 ng/mL               OR       >80% decrease in PCT            Discourage initiation of                                            antibiotics      Encourage discontinuation           of antibiotics    ----------------------------     -----------------------------         PCT >= 0.50 ng/mL              PCT 0.26 - 0.50 ng/mL                AND       <80% decrease in PCT              Encourage initiation of  antibiotics       Encourage continuation           of antibiotics    ----------------------------     -----------------------------        PCT >= 0.50 ng/mL                  PCT > 0.50 ng/mL               AND         increase in PCT                  Strongly encourage                                      initiation of antibiotics    Strongly encourage escalation           of antibiotics                                     -----------------------------                                           PCT <= 0.25 ng/mL                                                 OR                                        > 80% decrease in PCT                                     Discontinue / Do not initiate                                             antibiotics Performed at [facility], [address] [place], [place] [address]   Lactate dehydrogenase     Status: Abnormal   Collection Time: 03/10/19  1:55 AM  Result Value Ref Range   LDH 316 (H) 98 - 192 U/L    Comment: Performed at [facility], [place] [address], [place], [place] [address]  ABO/Rh     Status: None   Collection Time: 03/10/19  1:59 AM  Result Value Ref Range   ABO/RH(D)      A POS Performed at [facility] [address], [place], [place] [address]   SARS Coronavirus 2 (CEPHEID- Performed in [facility] hospital lab), Hosp Order     Status: Abnormal   Collection Time: 03/10/19  2:26 AM  Result Value Ref Range   SARS Coronavirus 2 POSITIVE (A) NEGATIVE    Comment: RESULT CALLED TO, READ BACK BY AND VERIFIED WITH: Neldon Labella [id number] 03/10/2019 T. TYSOR (NOTE) If result is NEGATIVE SARS-CoV-2 target  nucleic acids are NOT DETECTED. The SARS-CoV-2 RNA is generally detectable in upper and lower  respiratory specimens during the acute phase of infection. The lowest  concentration of SARS-CoV-2 viral copies this assay can detect is 250  copies / mL.  A negative result does not preclude SARS-CoV-2 infection  and should not be used as the sole basis for treatment or other  patient management decisions.  A negative result may occur with  improper specimen collection / handling, submission of specimen other  than nasopharyngeal swab, presence of viral mutation(s) within the  areas targeted by this assay, and inadequate number of viral copies  (<250 copies / mL). A negative result must be combined with clinical  observations, patient history, and epidemiological information. If result is POSITIVE SARS-CoV-2 target nucleic acids are DETECTED.  The SARS-CoV-2 RNA is generally detectable in upper and lower  respiratory specimens during the acute phase of infection.  Positive  results are indicative of active infection with SARS-CoV-2.  Clinical  correlation with patient history and other diagnostic information is  necessary to determine patient infection status.  Positive results do  not rule out bacterial infection or co-infection with other viruses. If result is PRESUMPTIVE POSTIVE SARS-CoV-2 nucleic acids MAY BE PRESENT.   A presumptive positive result was obtained on the submitted specimen  and confirmed on repeat testing.  While 2019 novel coronavirus  (SARS-CoV-2) nucleic acids may be present in the submitted sample  additional confirmatory testing may be necessary for epidemiological  and / or clinical management purposes  to differentiate between  SARS-CoV-2 and other Sarbecovirus currently known to infect humans.  If clinically indicated additional testing with an alternate test  methodology [phone])  is advised. The SARS-CoV-2 RNA is generally  detectable in upper and lower respiratory specimens during the acute  phase of infection. The expected result is Negative. Fact Sheet for Patients:  StrictlyIdeas.no Fact Sheet for Healthcare Providers: BankingDealers.co.za This test is not  yet approved or cleared by the [place] FDA and has been authorized for detection and/or diagnosis of SARS-CoV-2 by FDA under an Emergency Use Authorization (EUA).  This EUA will remain in effect (meaning this test can be used) for the duration of the COVID-19 declaration under Section 564(b)(1) of the Act, 21 U.S.C. section 360bbb-3(b)(1), unless the authorization is terminated or revoked sooner. Performed at [facility], [place] [address], [place], [place] [address]   Lactic acid, plasma     Status: None   Collection Time: 03/10/19  2:44 AM  Result Value Ref Range   Lactic Acid, Venous 1.1 0.5 - 1.9 mmol/L    Comment: Performed at [facility] [address] [place], [place] [address]   Dg Chest Portable 1 View  Result Date: 03/10/2019 CLINICAL DATA:  Shortness of breath, positive COVID-19 EXAM: PORTABLE CHEST 1 VIEW COMPARISON:  03/05/2019 FINDINGS: Cardiac shadow is mildly prominent but accentuated by the portable technique. Patchy densities are identified throughout both lungs predominately within the bases but laterally on the right in the mid lung new from the prior exam. These changes would correspond with the given clinical history of positive COVID-19. No sizable effusion is noted. No bony abnormality is seen. IMPRESSION: Mild patchy infiltrates bilaterally right greater than left. Electronically Signed   By: Inez Catalina M.D.   On: 03/10/2019 00:58    Pending Labs Unresulted Labs (From admission, onward)    Start     Ordered   03/11/19 0500  CBC with  Differential/Platelet  Daily,   R     03/10/19 0156   03/11/19 0500  Comprehensive metabolic panel  Daily,   R     03/10/19 0156   03/11/19 0500  C-reactive protein  Daily,   R     03/10/19 0156   03/11/19 0500  Interleukin-6, Plasma  Daily,   R     03/10/19 0156   03/10/19 0156  Interleukin-6, Plasma  Once,   R     03/10/19 0156   03/10/19 0156  Culture, sputum-assessment  Once,   R     03/10/19 0156    03/10/19 0155  HIV antibody (Routine Testing)  Once,   R     03/10/19 0156   03/10/19 0016  Culture, blood (Routine x 2)  BLOOD CULTURE X 2,   STAT     03/10/19 0016   03/10/19 0016  Urinalysis, Routine w reflex microscopic  ONCE - STAT,   STAT     03/10/19 0016          Vitals/Pain Today's Vitals   03/10/19 0500 03/10/19 0600 03/10/19 0652 03/10/19 0652  BP: 114/64 112/63    Pulse:  87    Resp: 17 (!) 28  20  Temp:    100.1 F (37.8 C)  TempSrc:      SpO2: 100% 100%  100%  Weight:      Height:      PainSc:   0-No pain     Isolation Precautions Droplet and Contact precautions  Medications Medications  sodium chloride flush (NS) 0.9 % injection 3 mL (has no administration in time range)  sevelamer carbonate (RENVELA) tablet 2,400 mg (has no administration in time range)  heparin injection 5,000 Units (5,000 Units Subcutaneous Given 03/10/19 0651)  acetaminophen (TYLENOL) tablet 650 mg (has no administration in time range)  ondansetron (ZOFRAN) tablet 4 mg (has no administration in time range)    Or  ondansetron (ZOFRAN) injection 4 mg (has no administration in time range)  acetaminophen (TYLENOL) tablet 650 mg (650 mg Oral Given 03/10/19 0041)  ondansetron (ZOFRAN-ODT) disintegrating tablet 4 mg (4 mg Oral Given 03/10/19 0050)    Mobility walks Low fall risk   Focused Assessments Pulmonary Assessment Handoff:  Lung sounds: Bilateral Breath Sounds: Clear L Breath Sounds: Clear R Breath Sounds: Clear O2 Device: Nasal Cannula O2 Flow Rate (L/min): 2 L/min      R Recommendations: See Admitting Provider Note  Report given to:   Additional Notes:

## 2019-03-10 NOTE — ED Notes (Signed)
Pt had urine and stool in bedside commode, unable to collect urine d/t contamination.

## 2019-03-10 NOTE — ED Notes (Signed)
bfast ordered

## 2019-03-11 DIAGNOSIS — Z8249 Family history of ischemic heart disease and other diseases of the circulatory system: Secondary | ICD-10-CM | POA: Diagnosis not present

## 2019-03-11 DIAGNOSIS — J1289 Other viral pneumonia: Secondary | ICD-10-CM | POA: Diagnosis not present

## 2019-03-11 DIAGNOSIS — R0902 Hypoxemia: Secondary | ICD-10-CM | POA: Diagnosis present

## 2019-03-11 DIAGNOSIS — K5190 Ulcerative colitis, unspecified, without complications: Secondary | ICD-10-CM | POA: Diagnosis present

## 2019-03-11 DIAGNOSIS — Z841 Family history of disorders of kidney and ureter: Secondary | ICD-10-CM | POA: Diagnosis not present

## 2019-03-11 DIAGNOSIS — M1990 Unspecified osteoarthritis, unspecified site: Secondary | ICD-10-CM | POA: Diagnosis present

## 2019-03-11 DIAGNOSIS — Z6838 Body mass index (BMI) 38.0-38.9, adult: Secondary | ICD-10-CM | POA: Diagnosis not present

## 2019-03-11 DIAGNOSIS — I132 Hypertensive heart and chronic kidney disease with heart failure and with stage 5 chronic kidney disease, or end stage renal disease: Secondary | ICD-10-CM | POA: Diagnosis present

## 2019-03-11 DIAGNOSIS — E669 Obesity, unspecified: Secondary | ICD-10-CM | POA: Diagnosis present

## 2019-03-11 DIAGNOSIS — N186 End stage renal disease: Secondary | ICD-10-CM | POA: Diagnosis not present

## 2019-03-11 DIAGNOSIS — U071 COVID-19: Secondary | ICD-10-CM | POA: Diagnosis present

## 2019-03-11 DIAGNOSIS — D631 Anemia in chronic kidney disease: Secondary | ICD-10-CM | POA: Diagnosis present

## 2019-03-11 DIAGNOSIS — Z992 Dependence on renal dialysis: Secondary | ICD-10-CM | POA: Diagnosis not present

## 2019-03-11 DIAGNOSIS — I5032 Chronic diastolic (congestive) heart failure: Secondary | ICD-10-CM | POA: Diagnosis present

## 2019-03-11 DIAGNOSIS — M898X9 Other specified disorders of bone, unspecified site: Secondary | ICD-10-CM | POA: Diagnosis present

## 2019-03-11 DIAGNOSIS — E876 Hypokalemia: Secondary | ICD-10-CM | POA: Diagnosis present

## 2019-03-11 DIAGNOSIS — Z882 Allergy status to sulfonamides status: Secondary | ICD-10-CM | POA: Diagnosis not present

## 2019-03-11 DIAGNOSIS — M109 Gout, unspecified: Secondary | ICD-10-CM | POA: Diagnosis present

## 2019-03-11 DIAGNOSIS — J9601 Acute respiratory failure with hypoxia: Secondary | ICD-10-CM | POA: Diagnosis not present

## 2019-03-11 LAB — COMPREHENSIVE METABOLIC PANEL
ALT: 20 U/L (ref 0–44)
AST: 40 U/L (ref 15–41)
Albumin: 3 g/dL — ABNORMAL LOW (ref 3.5–5.0)
Alkaline Phosphatase: 16 U/L — ABNORMAL LOW (ref 38–126)
Anion gap: 17 — ABNORMAL HIGH (ref 5–15)
BUN: 41 mg/dL — ABNORMAL HIGH (ref 6–20)
CO2: 28 mmol/L (ref 22–32)
Calcium: 8.2 mg/dL — ABNORMAL LOW (ref 8.9–10.3)
Chloride: 91 mmol/L — ABNORMAL LOW (ref 98–111)
Creatinine, Ser: 12.1 mg/dL — ABNORMAL HIGH (ref 0.61–1.24)
GFR calc Af Amer: 5 mL/min — ABNORMAL LOW (ref >60)
GFR calc non Af Amer: 4 mL/min — ABNORMAL LOW (ref >60)
Glucose, Bld: 107 mg/dL — ABNORMAL HIGH (ref 70–99)
Potassium: 4.5 mmol/L (ref 3.5–5.1)
Sodium: 136 mmol/L (ref 135–145)
Total Bilirubin: 1.2 mg/dL (ref 0.3–1.2)
Total Protein: 6.7 g/dL (ref 6.5–8.1)

## 2019-03-11 LAB — CBC WITH DIFFERENTIAL/PLATELET
Abs Immature Granulocytes: 0.04 10*3/uL (ref 0.00–0.07)
Basophils Absolute: 0 10*3/uL (ref 0.0–0.1)
Basophils Relative: 0 %
Eosinophils Absolute: 0 10*3/uL (ref 0.0–0.5)
Eosinophils Relative: 0 %
HCT: 25.9 % — ABNORMAL LOW (ref 39.0–52.0)
Hemoglobin: 8.6 g/dL — ABNORMAL LOW (ref 13.0–17.0)
Immature Granulocytes: 1 %
Lymphocytes Relative: 15 %
Lymphs Abs: 1 10*3/uL (ref 0.7–4.0)
MCH: 34.4 pg — ABNORMAL HIGH (ref 26.0–34.0)
MCHC: 33.2 g/dL (ref 30.0–36.0)
MCV: 103.6 fL — ABNORMAL HIGH (ref 80.0–100.0)
Monocytes Absolute: 0.4 10*3/uL (ref 0.1–1.0)
Monocytes Relative: 6 %
Neutro Abs: 5 10*3/uL (ref 1.7–7.7)
Neutrophils Relative %: 78 %
Platelets: 196 10*3/uL (ref 150–400)
RBC: 2.5 MIL/uL — ABNORMAL LOW (ref 4.22–5.81)
RDW: 14.4 % (ref 11.5–15.5)
WBC: 6.4 10*3/uL (ref 4.0–10.5)
nRBC: 0.3 % — ABNORMAL HIGH (ref 0.0–0.2)

## 2019-03-11 LAB — C-REACTIVE PROTEIN: CRP: 17.4 mg/dL — ABNORMAL HIGH (ref <1.0)

## 2019-03-11 LAB — D-DIMER, QUANTITATIVE: D-Dimer, Quant: 1.86 ug/mL-FEU — ABNORMAL HIGH (ref 0.00–0.50)

## 2019-03-11 MED ORDER — CHLORHEXIDINE GLUCONATE CLOTH 2 % EX PADS
6.0000 | MEDICATED_PAD | Freq: Every day | CUTANEOUS | Status: DC
  Administered 2019-03-11 – 2019-03-17 (×4): 6 via TOPICAL

## 2019-03-11 MED ORDER — CALCITRIOL 0.25 MCG PO CAPS
1.0000 ug | ORAL_CAPSULE | ORAL | Status: DC
  Administered 2019-03-12 – 2019-03-19 (×4): 1 ug via ORAL
  Filled 2019-03-11 (×5): qty 4

## 2019-03-11 MED ORDER — HEPARIN SODIUM (PORCINE) 5000 UNIT/ML IJ SOLN
7500.0000 [IU] | Freq: Three times a day (TID) | INTRAMUSCULAR | Status: DC
  Administered 2019-03-11 – 2019-03-20 (×25): 7500 [IU] via SUBCUTANEOUS
  Filled 2019-03-11 (×27): qty 2

## 2019-03-11 MED ORDER — METHYLPREDNISOLONE SODIUM SUCC 40 MG IJ SOLR
40.0000 mg | Freq: Every day | INTRAMUSCULAR | Status: DC
  Administered 2019-03-11 – 2019-03-18 (×8): 40 mg via INTRAVENOUS
  Filled 2019-03-11 (×8): qty 1

## 2019-03-11 NOTE — Progress Notes (Signed)
Encouraged pt to sleep on stomach as much as possible to help with getting air in and out of lungs;. Pt voiced understanding.

## 2019-03-11 NOTE — Consult Note (Signed)
Renal Service Consult Note [facility]  Colin [name] 03/11/2019 Sol Blazing Requesting Physician:  Dr Marlowe Sax  Reason for Consult:  ESRD pt w/ COVID+ resp infection HPI: The patient is a 53 y.o. year-old with hx of ulcerative colitis, PNA, HTN and gout, ESRD on HD MWF.  Pt was in ED on 5/18 w/ cough and SOB, tested COVID+ and was dc'd to home and set up for HD at special COVID+ unit/ shift.  He had dialysis there 2-3 x last week. Came back to ED for worsening resp symptoms and was admitted to 2W.  New O2 requirement. Asked to see for ESRD.    Pt w/o complaints except coughing.  SOB ok.  No CP.  CXR here showed patchy bilat infiltrates.    At OP HD has been coming off the machine under dry wt lately.    ROS  denies CP  no joint pain   no HA  no blurry vision  no rash  no diarrhea  no nausea/ vomiting   Past Medical History  Past Medical History:  Diagnosis Date  . Anemia    iron deficiency  . Arthritis    Gout  . CKD (chronic kidney disease) stage 3, GFR 30-59 ml/min (HCC)    Stage 4  . Gout   . History of chicken pox   . Hypertension   . Melanosis coli   . Mumps   . Pneumonia   . Ulcerative colitis [place], [place]   Past Surgical History  Past Surgical History:  Procedure Laterality Date  . AV FISTULA PLACEMENT Right 08/27/2016   Procedure: RIGHT RADIOCEPHALIC ARTERIOVENOUS FISTULA CREATION;  Surgeon: [name], MD;  Location: [facility] OR;  Service: Vascular;  Laterality: Right;  . AV FISTULA PLACEMENT Right 10/21/2017   Procedure: ARTERIOVENOUS (AV) FISTULA CREATION RIGHT ARM;  Surgeon: [name], MD;  Location: [facility];  Service: Vascular;  Laterality: Right;  . COLONOSCOPY    . FISTULA SUPERFICIALIZATION Right 11/12/2016   Procedure: FISTULA SUPERFICIALIZATION RIGHT FOREEARM;  Surgeon: [name], MD;  Location: [facility] OR;  Service: Vascular;  Laterality: Right;  . REVISON OF ARTERIOVENOUS FISTULA Right 12/23/2017   Procedure: CEPHALIC  VEIN TRANSPOSITION ARTERIOVENOUS FISTULA RIGHT UPPER ARM;  Surgeon: [name], MD;  Location: [facility] OR;  Service: Vascular;  Laterality: Right;  . TENDON REPAIR  1987   Left index finger   Family History  Family History  Problem Relation Age of Onset  . Hypertension Father   . Heart disease Father   . Renal Disease Father   . Hypertension Mother    Social History  reports that he has never smoked. He has quit using smokeless tobacco.  His smokeless tobacco use included chew. He reports current alcohol use. He reports that he does not use drugs. Allergies  Allergies  Allergen Reactions  . Sulfa Antibiotics Shortness Of Breath and Rash   Home medications Prior to Admission medications   Medication Sig Start Date End Date Taking? Authorizing Provider  acetaminophen (TYLENOL) 500 MG tablet Take 500 mg by mouth every 6 (six) hours as needed for mild pain.    [provider]  allopurinol (ZYLOPRIM) 100 MG tablet Take 150 mg by mouth daily.    [provider]  amLODipine (NORVASC) 10 MG tablet Take 10 mg by mouth at bedtime.     [provider]  atenolol (TENORMIN) 50 MG tablet Take 100 mg by mouth at bedtime. 09/04/17  [provider]  bromocriptine (PARLODEL) 2.5 MG tablet Take 1 tablet (2.5 mg total) by mouth at bedtime. Patient not taking: Reported on 10/27/2018 01/17/18   Renato Shin, MD  CALCITRIOL PO Take 3 capsules by mouth every Monday, Wednesday, and Friday. Only on dialysis days    [provider]  cyclobenzaprine (FLEXERIL) 10 MG tablet Take 1 tablet (10 mg total) by mouth 2 (two) times daily as needed for muscle spasms. Patient not taking: Reported on 10/27/2018 08/15/18   Wieters, Hallie C, PA-C  diclofenac sodium (VOLTAREN) 1 % GEL Apply 2 g topically 4 (four) times daily. Patient not taking: Reported on 10/27/2018 08/15/18   Wieters, Hallie C, PA-C  ferrous sulfate 325 (65 FE) MG tablet Take 325 mg by mouth at bedtime.    [provider]  HUMIRA PEN 40 MG/0.4ML PNKT Inject 40 mg into the muscle every 14 (fourteen) days.  11/22/17   [provider]  hydrALAZINE (APRESOLINE) 50 MG tablet Take 1 tablet (50 mg total) by mouth at bedtime. Patient not taking: Reported on 10/27/2018 08/27/16   Alvia Grove, PA-C  HYDROcodone-acetaminophen (NORCO) 5-325 MG tablet Take 1 tablet by mouth every 6 (six) hours as needed for moderate pain. Patient not taking: Reported on 01/09/2019 12/23/17   Dagoberto Ligas, PA-C  lidocaine (XYLOCAINE) 5 % ointment Apply 1 application topically 4 (four) times daily as needed. Patient not taking: Reported on 01/09/2019 10/27/18   Virgel Manifold, MD  multivitamin (RENA-VIT) TABS tablet Take 1 tablet by mouth at bedtime. 07/24/18   [provider]  ondansetron (ZOFRAN ODT) 4 MG disintegrating tablet Take 1 tablet (4 mg total) by mouth every 8 (eight) hours as needed for nausea or vomiting. 03/05/19   Albrizze, Verline Lema E, PA-C  pravastatin (PRAVACHOL) 40 MG tablet Take 40 mg by mouth at bedtime.     [provider]  psyllium (METAMUCIL SMOOTH TEXTURE) 58.6 % powder Take 1 packet by mouth 3 (three) times daily. Patient not taking: Reported on 03/05/2019 10/27/18   Virgel Manifold, MD  sevelamer carbonate (RENVELA) 800 MG tablet Take 2,400 mg by mouth 3 (three) times daily with meals.     [provider]  traMADol (ULTRAM) 50 MG tablet Take 1 tablet (50 mg total) by mouth every 12 (twelve) hours as needed. Patient not taking: Reported on 10/27/2018 08/15/18   Janith Lima, PA-C   Liver Function Tests Recent Labs  Lab 03/10/19 0016 03/11/19 0641  AST 31 40  ALT 20 20  ALKPHOS 21* 16*  BILITOT 0.9 1.2  PROT 8.0 6.7  ALBUMIN 3.5 3.0*   No results for input(s): LIPASE, AMYLASE in the last 168 hours. CBC Recent Labs  Lab 03/05/19 1004 03/10/19 0016 03/11/19 0641  WBC 6.6 5.3 6.4  NEUTROABS 4.5 4.0 5.0  HGB 9.4* 10.3* 8.6*  HCT 28.6* 31.4* 25.9*  MCV  105.5* 104.0* 103.6*  PLT 229 206 790   Basic Metabolic Panel Recent Labs  Lab 03/05/19 1004 03/10/19 0016 03/11/19 0641  NA 135 136 136  K 4.2 3.3* 4.5  CL 97* 92* 91*  CO2 26 29 28   GLUCOSE 110* 99 107*  BUN 53* 15 41*  CREATININE 12.66* 6.39* 12.10*  CALCIUM 9.8 8.1* 8.2*   Iron/TIBC/Ferritin/ %Sat    Component Value Date/Time   FERRITIN 1,449 (H) 03/10/2019 0155    Vitals:   03/10/19 2157 03/11/19 0100 03/11/19 0600 03/11/19 0832  BP: 109/64  109/65 114/78  Pulse: 85  85 80  Resp: (!) 24   18  Temp: (!) 102.9 F (39.4 C) 98.9 F (37.2 C) (!) 101.5 F (38.6 C) 99.4 F (37.4 C)  TempSrc: Oral Oral Oral Oral  SpO2: 90%  92% (!) 89%  Weight:      Height:       Exam Gen alert , +coughing spells, not in distress No rash, cyanosis or gangrene Sclera anicteric, throat clear  No jvd or bruits Chest coarse rhonchi bilat, occ basilar rales RRR no MRG Abd soft ntnd no mass or ascites +bs GU normal mal MS no joint effusions or deformity Ext no LE or UE edema, no wounds or ulcers Neuro is alert, Ox 3 , nf R AVF+bruit    Home meds:  - amlodipine 10/ atenolol 100 hs/ hydralazine 50 hs  - allopurinol 150 qd/ pravastatin 40 hs/ sevelamer carb 3 ac  - humira 40 mg im every 2wk  - bromocriptine 2.5hs/ prn norco/ prn tramadol  - prn's/ vitamins/ supplements   CXR 5/23 > IMPRESSION: Mild patchy infiltrates bilaterally right greater than left.   MWF HD  4h 50mn  F200  450/800   105.5kg  2/2 bath   Hep 8000  RUA AVF  - mircera q 2w, last 5/20 due 6/3, Hb 10.2  - vit d 1.0 ug tiw   Assessment: 1. COVID+ PNA - per primary 2. ESRD - on HD MWF. HD tomorrow.  3. HTN/ vol - under dry wt and no vol excess on exam, BP's soft and home BP meds on hold. Min UF on HD tomorrow.  4. Ulcerative colitis - taking Humira every 2wks 5. Anemia ckd - next esa due 6/3, Hb 8.6 (usual 10's) 6. MBD ckd - cont vit d and binders    Plan: 1. As above      RKelly Splinter  MD 03/11/2019, 11:44 AM

## 2019-03-11 NOTE — Progress Notes (Signed)
[facility] Progress Note  Patient: Colin [name] ZOX:[id number]   PCP: Gaynelle Arabian, MD DOB: 04-24-1966   DOA: 03/09/2019   DOS: 03/11/2019   Date of Service: the patient was seen and examined on 03/11/2019  Brief hospital course: Pt. with PMH of ulcerative colitis, end-stage renal disease on hemodialysis, and hypertension; admitted on 03/09/2019, presented with complaint of shortness of breath, was found to have acute COVID  infection. Currently further plan is continue current supportive care.  Subjective: Continues to have cough and shortness of breath.  No nausea no vomiting.  No headache.  No diarrhea.  No chest pain abdominal pain for now.  Assessment and Plan: 1. Acute COVID-19 Viral illness Lab Results  Component Value Date   SARSCOV2NAA POSITIVE (A) 03/10/2019   SARSCOV2NAA POSITIVE (A) 03/05/2019   RVP not done lymphopenia, Low Procalcitonin,  CXR: hazy bilateral peripheral opacities  Recent Labs    03/10/19 0155 03/11/19 0641 03/11/19 1058  DDIMER 1.87*  --  1.86*  FERRITIN 1,449*  --   --   LDH 316*  --   --   CRP 10.5* 17.4*  --    Tmax:  102.9 Oxygen requirements: Room air at home, currently 2 L of oxygen  Antibiotics:none Diuretics: none on ESRD Vitamin C and Zinc: Ordered DVT Prophylaxis: Subcutaneous Heparin  Dose increased due to worsening marker as well as higher BMI Body mass index is 38.11 kg/m.  Remdesivir: Not a candidate due to ESRD Steroids: 1 mg/kg, IV Solu-Medrol Actemra: Currently holding due to ulcerative colitis Prone positioning: Patient encouraged to stay in prone position as much as possible.  During this encounter: Patient Isolation: Droplet + Contact HCP PPE: CAPR, gown. gloves Patient PPE: None  The treatment plan and use of medications and known side effects were discussed with patient/family. It was clearly explained that there is no proven definitive treatment for COVID-19 infection yet. Any medications used here are based  on case reports/anecdotal data which are not peer-reviewed and has not been studied using randomized control trials.  Complete risks and long-term side effects are unknown, however in the best clinical judgment they seem to be of some clinical benefit rather than medical risks.  Patient/family agree with the treatment plan and want to receive these treatments as indicated.   2.  ESRD on HD. Continue HD MWF. Appreciate nephrology consultation.  3.  Ulcerative colitis. Patient reports of abdominal cramps as well as diarrhea this morning. Discussed with Dr. Benson Norway patient's primary gastroenterologist. Julianne Rice is currently on hold.  4.  Anemia of chronic kidney disease. H&H stable. Monitor.  5.  Essential hypertension. Blood pressure soft. Currently holding antihypertensive medication.  Diet: Renal diet DVT Prophylaxis: subcutaneous Heparin  Advance goals of care discussion: Full code  Family Communication: no family was present at bedside, at the time of interview.  Discussed with wife on phone on 03/11/2019  Disposition:  Discharge to home.  Consultants: Nephrology Procedures: Hemodialysis  Scheduled Meds: . benzonatate  100 mg Oral TID  . [START ON 03/12/2019] calcitRIOL  1 mcg Oral Q M,W,F-HD  . Chlorhexidine Gluconate Cloth  6 each Topical Q0600  . guaiFENesin  600 mg Oral BID  . heparin  7,500 Units Subcutaneous Q8H  . methylPREDNISolone (SOLU-MEDROL) injection  40 mg Intravenous Daily  . sevelamer carbonate  2,400 mg Oral TID WC  . sodium chloride flush  3 mL Intravenous Once   Continuous Infusions: PRN Meds: acetaminophen, Peppermint Oil, ondansetron **OR** ondansetron (ZOFRAN) IV Antibiotics: Anti-infectives (From admission,  onward)   None       Objective: Physical Exam: Vitals:   03/11/19 0100 03/11/19 0600 03/11/19 0832 03/11/19 1626  BP:  109/65 114/78 122/68  Pulse:  85 80 82  Resp:   18 20  Temp: 98.9 F (37.2 C) (!) 101.5 F (38.6 C) 99.4 F (37.4 C)  99.3 F (37.4 C)  TempSrc: Oral Oral Oral Oral  SpO2:  92% (!) 89% 91%  Weight:      Height:        Intake/Output Summary (Last 24 hours) at 03/11/2019 1948 Last data filed at 03/11/2019 1800 Gross per 24 hour  Intake 600 ml  Output 200 ml  Net 400 ml   Filed Weights   03/09/19 2353  Weight: 103.9 kg   General: Alert, Awake and Oriented to Time, Place and Person. Appear in mild distress, affect appropriate Eyes: PERRL, Conjunctiva normal ENT: Oral Mucosa clear moist. Neck: no JVD, no Abnormal Mass Or lumps Cardiovascular: S1 and S2 Present, no Murmur, Peripheral Pulses Present Respiratory: Increased respiratory effort, Bilateral Air entry equal and Decreased, no use of accessory muscle, no Crackles, bilateral  wheezes Abdomen: Bowel Sound present, Soft and no tenderness, no hernia Skin: no redness, no Rash, no induration Extremities: no Pedal edema, no calf tenderness Neurologic: Grossly no focal neuro deficit. Bilaterally Equal motor strength  Data Reviewed: CBC: Recent Labs  Lab 03/05/19 1004 03/10/19 0016 03/11/19 0641  WBC 6.6 5.3 6.4  NEUTROABS 4.5 4.0 5.0  HGB 9.4* 10.3* 8.6*  HCT 28.6* 31.4* 25.9*  MCV 105.5* 104.0* 103.6*  PLT 229 206 681   Basic Metabolic Panel: Recent Labs  Lab 03/05/19 1004 03/10/19 0016 03/11/19 0641  NA 135 136 136  K 4.2 3.3* 4.5  CL 97* 92* 91*  CO2 26 29 28   GLUCOSE 110* 99 107*  BUN 53* 15 41*  CREATININE 12.66* 6.39* 12.10*  CALCIUM 9.8 8.1* 8.2*    Liver Function Tests: Recent Labs  Lab 03/10/19 0016 03/11/19 0641  AST 31 40  ALT 20 20  ALKPHOS 21* 16*  BILITOT 0.9 1.2  PROT 8.0 6.7  ALBUMIN 3.5 3.0*   No results for input(s): LIPASE, AMYLASE in the last 168 hours. No results for input(s): AMMONIA in the last 168 hours. Coagulation Profile: Recent Labs  Lab 03/10/19 0016  INR 0.9   Cardiac Enzymes: No results for input(s): CKTOTAL, CKMB, CKMBINDEX, TROPONINI in the last 168 hours. BNP (last 3 results)  No results for input(s): PROBNP in the last 8760 hours. CBG: No results for input(s): GLUCAP in the last 168 hours. Studies: No results found.   Time spent: 35 minutes  Author: Berle Mull, MD [facility] 03/11/2019 7:48 PM  To reach On-call, see care teams to locate the attending and reach out to them via [email or web address]. If 7PM-7AM, please contact night-coverage If you still have difficulty reaching the attending provider, please page the [facility] (Director on Call) for [facility] on amion for assistance.

## 2019-03-12 LAB — CBC WITH DIFFERENTIAL/PLATELET
Abs Immature Granulocytes: 0.05 10*3/uL (ref 0.00–0.07)
Basophils Absolute: 0 10*3/uL (ref 0.0–0.1)
Basophils Relative: 0 %
Eosinophils Absolute: 0 10*3/uL (ref 0.0–0.5)
Eosinophils Relative: 0 %
HCT: 27.2 % — ABNORMAL LOW (ref 39.0–52.0)
Hemoglobin: 9.1 g/dL — ABNORMAL LOW (ref 13.0–17.0)
Immature Granulocytes: 1 %
Lymphocytes Relative: 14 %
Lymphs Abs: 0.9 10*3/uL (ref 0.7–4.0)
MCH: 34 pg (ref 26.0–34.0)
MCHC: 33.5 g/dL (ref 30.0–36.0)
MCV: 101.5 fL — ABNORMAL HIGH (ref 80.0–100.0)
Monocytes Absolute: 0.4 10*3/uL (ref 0.1–1.0)
Monocytes Relative: 6 %
Neutro Abs: 5 10*3/uL (ref 1.7–7.7)
Neutrophils Relative %: 79 %
Platelets: 280 10*3/uL (ref 150–400)
RBC: 2.68 MIL/uL — ABNORMAL LOW (ref 4.22–5.81)
RDW: 14.2 % (ref 11.5–15.5)
WBC: 6.3 10*3/uL (ref 4.0–10.5)
nRBC: 0.3 % — ABNORMAL HIGH (ref 0.0–0.2)

## 2019-03-12 LAB — URINALYSIS, ROUTINE W REFLEX MICROSCOPIC
Bacteria, UA: NONE SEEN
Bilirubin Urine: NEGATIVE
Glucose, UA: NEGATIVE mg/dL
Ketones, ur: NEGATIVE mg/dL
Leukocytes,Ua: NEGATIVE
Nitrite: NEGATIVE
Protein, ur: 100 mg/dL — AB
Specific Gravity, Urine: 1.013 (ref 1.005–1.030)
pH: 7 (ref 5.0–8.0)

## 2019-03-12 LAB — COMPREHENSIVE METABOLIC PANEL
ALT: 14 U/L (ref 0–44)
AST: 23 U/L (ref 15–41)
Albumin: 2.9 g/dL — ABNORMAL LOW (ref 3.5–5.0)
Alkaline Phosphatase: 20 U/L — ABNORMAL LOW (ref 38–126)
Anion gap: 14 (ref 5–15)
BUN: 63 mg/dL — ABNORMAL HIGH (ref 6–20)
CO2: 27 mmol/L (ref 22–32)
Calcium: 8.6 mg/dL — ABNORMAL LOW (ref 8.9–10.3)
Chloride: 92 mmol/L — ABNORMAL LOW (ref 98–111)
Creatinine, Ser: 15.15 mg/dL — ABNORMAL HIGH (ref 0.61–1.24)
GFR calc Af Amer: 4 mL/min — ABNORMAL LOW (ref >60)
GFR calc non Af Amer: 3 mL/min — ABNORMAL LOW (ref >60)
Glucose, Bld: 128 mg/dL — ABNORMAL HIGH (ref 70–99)
Potassium: 3.8 mmol/L (ref 3.5–5.1)
Sodium: 133 mmol/L — ABNORMAL LOW (ref 135–145)
Total Bilirubin: 0.9 mg/dL (ref 0.3–1.2)
Total Protein: 7.3 g/dL (ref 6.5–8.1)

## 2019-03-12 LAB — FERRITIN: Ferritin: 1951 ng/mL — ABNORMAL HIGH (ref 24–336)

## 2019-03-12 LAB — D-DIMER, QUANTITATIVE: D-Dimer, Quant: 1.36 ug/mL-FEU — ABNORMAL HIGH (ref 0.00–0.50)

## 2019-03-12 LAB — LACTATE DEHYDROGENASE: LDH: 357 U/L — ABNORMAL HIGH (ref 98–192)

## 2019-03-12 LAB — INTERLEUKIN-6, PLASMA: Interleukin-6, Plasma: 34.9 pg/mL — ABNORMAL HIGH (ref 0.0–12.2)

## 2019-03-12 LAB — C-REACTIVE PROTEIN: CRP: 18.6 mg/dL — ABNORMAL HIGH (ref <1.0)

## 2019-03-12 LAB — PHOSPHORUS: Phosphorus: 6 mg/dL — ABNORMAL HIGH (ref 2.5–4.6)

## 2019-03-12 MED ORDER — HEPARIN SODIUM (PORCINE) 1000 UNIT/ML DIALYSIS
8000.0000 [IU] | Freq: Once | INTRAMUSCULAR | Status: AC
  Administered 2019-03-12: 14:00:00 8000 [IU] via INTRAVENOUS_CENTRAL
  Filled 2019-03-12: qty 8

## 2019-03-12 MED ORDER — HEPARIN SODIUM (PORCINE) 1000 UNIT/ML IJ SOLN
INTRAMUSCULAR | Status: AC
  Administered 2019-03-12: 8000 [IU] via INTRAVENOUS_CENTRAL
  Filled 2019-03-12: qty 8

## 2019-03-12 MED ORDER — NEPRO/CARBSTEADY PO LIQD
237.0000 mL | Freq: Two times a day (BID) | ORAL | Status: DC
  Administered 2019-03-13 – 2019-03-17 (×5): 237 mL via ORAL

## 2019-03-12 NOTE — Progress Notes (Signed)
Initial Nutrition Assessment  DOCUMENTATION CODES:   Obesity unspecified  INTERVENTION:   -Provide Nepro Shake po BID, each supplement provides 425 kcal and 19 grams protein -Recommend resume Rena-vit supplement  NUTRITION DIAGNOSIS:   Increased nutrient needs related to chronic illness(ESRD on HD) as evidenced by estimated needs.  GOAL:   Patient will meet greater than or equal to 90% of their needs  MONITOR:   PO intake, Supplement acceptance, Labs, Weight trends, I & O's  REASON FOR ASSESSMENT:   Malnutrition Screening Tool    ASSESSMENT:   Pt. with PMH of ulcerative colitis, end-stage renal disease on hemodialysis, and hypertension; admitted on 03/09/2019, presented with complaint of shortness of breath, was found to have acute COVID  infection. 5/18: test for COVID-19 positive  **RD working remotely**  Patient expected to have HD today. Per chart review, pt receives HD MWF. Pt has been having diarrhea d/t ulcerative colitis. No PO documented yet for patient. Pt would benefit from protein supplements given ESRD and current COVID illness. Will order Nepro supplements.  EDW: 105.5 kg. Per weight records, pt is currently weighing less than EDW, -5 lb.   Medications: IV Zofran PRN Labs reviewed: Elevated Phos GFR: 5  NUTRITION - FOCUSED PHYSICAL EXAM:  Unable to perform per department requirements to work remotely.  Diet Order:   Diet Order            Diet renal with fluid restriction Fluid restriction: 1200 mL Fluid; Room service appropriate? Yes; Fluid consistency: Thin  Diet effective now              EDUCATION NEEDS:   No education needs have been identified at this time  Skin:  Skin Assessment: Reviewed RN Assessment  Last BM:  5/24  Height:   Ht Readings from Last 1 Encounters:  03/09/19 5' 5"  (1.651 m)    Weight:   Wt Readings from Last 1 Encounters:  03/12/19 103.3 kg    Ideal Body Weight:  61.8 kg  BMI:  Body mass index is 37.9  kg/m.  Estimated Nutritional Needs:   Kcal:  3474-2595  Protein:  110-120g  Fluid:  1L + UOP  Clayton Bibles, MS, RD, LDN [place] [facility] Pager: [phone] After Hours Pager: [phone]

## 2019-03-12 NOTE — Progress Notes (Signed)
Pt coughing and oxygen sats 90-92%.  Increased oxygen to 3L [place].  Pt now 93%.  Administered scheduled Tesslon Pearls.

## 2019-03-12 NOTE — Progress Notes (Signed)
[facility] Progress Note  Subjective: spoke w/ Dr Posey Pronto for updates.  Doing better , on steroids now for 7 day course.   Vitals:   03/12/19 1340 03/12/19 1400 03/12/19 1415 03/12/19 1430  BP: 113/68 115/69 126/80 117/67  Pulse: 73 76 70 68  Resp:      Temp:      TempSrc:      SpO2:      Weight:      Height:        Inpatient medications: . benzonatate  100 mg Oral TID  . calcitRIOL  1 mcg Oral Q M,W,F-HD  . Chlorhexidine Gluconate Cloth  6 each Topical Q0600  . guaiFENesin  600 mg Oral BID  . heparin  7,500 Units Subcutaneous Q8H  . methylPREDNISolone (SOLU-MEDROL) injection  40 mg Intravenous Daily  . sevelamer carbonate  2,400 mg Oral TID WC    acetaminophen, Peppermint Oil, ondansetron **OR** ondansetron (ZOFRAN) IV    Exam:  Patient not examined directly given COVID-19 + status, utilizing exam of the primary team and observations of RN's.      Home meds:  - amlodipine 10/ atenolol 100 hs/ hydralazine 50 hs  - allopurinol 150 qd/ pravastatin 40 hs/ sevelamer carb 3 ac  - humira 40 mg im every 2wk  - bromocriptine 2.5hs/ prn norco/ prn tramadol  - prn's/ vitamins/ supplements   CXR 5/23 > IMPRESSION: Mild patchy infiltrates bilaterally right greater than left.   MWF HD  4h 91mn  F200  450/800   105.5kg  2/2 bath   Hep 8000  RUA AVF  - mircera q 2w, last 5/20 due 6/3, Hb 10.2  - vit d 1.0 ug tiw   Assessment: 1. COVID+ PNA - per primary, started on IV steroids 7-day course 2. ESRD - on HD MWF. HD today.  3. HTN/ vol - under dry wt and no vol excess on exam, BP's soft and home BP meds on hold. Keep even w HD today.  4. Ulcerative colitis - taking Humira every 2wks 5. Anemia ckd - next esa due 6/3, Hb 8.6 (usual 10's) 6. MBD ckd - cont vit d and binders    Rob [facility] 03/12/2019, 2:56 PM  Iron/TIBC/Ferritin/ %Sat    Component Value Date/Time   FERRITIN 1,449 (H) 03/10/2019 0155   Recent Labs  Lab  03/10/19 0016 03/11/19 0641  NA 136 136  K 3.3* 4.5  CL 92* 91*  CO2 29 28  GLUCOSE 99 107*  BUN 15 41*  CREATININE 6.39* 12.10*  CALCIUM 8.1* 8.2*  ALBUMIN 3.5 3.0*  INR 0.9  --    Recent Labs  Lab 03/11/19 0641  AST 40  ALT 20  ALKPHOS 16*  BILITOT 1.2  PROT 6.7   Recent Labs  Lab 03/12/19 1400  WBC 6.3  HGB 9.1*  HCT 27.2*  PLT 280

## 2019-03-12 NOTE — Progress Notes (Signed)
Updated pt's mother on pt condition & plan of care.

## 2019-03-12 NOTE — Progress Notes (Signed)
[facility] Progress Note  Patient: Colin [name] NIO:[id number]   PCP: Gaynelle Arabian, MD DOB: [birth year]   DOA: 03/09/2019   DOS: 03/12/2019   Date of Service: the patient was seen and examined on 03/12/2019  Brief hospital course: Pt. with PMH of ulcerative colitis, end-stage renal disease on hemodialysis, and hypertension; admitted on 03/09/2019, presented with complaint of shortness of breath, was found to have acute COVID  infection. Currently further plan is continue current supportive care.  Subjective: Patient denies any chest pain or abdominal pain.  Reports neck pain.  No fever no chills.  Breathing is still heavy.  But improving.  Cough still present.  Assessment and Plan: 1. Acute COVID-19 Viral illness Lab Results  Component Value Date   SARSCOV2NAA POSITIVE (A) 03/10/2019   SARSCOV2NAA POSITIVE (A) 03/05/2019   RVP not done lymphopenia, Low Procalcitonin,  CXR: hazy bilateral peripheral opacities  Recent Labs    03/10/19 0155 03/11/19 0641 03/11/19 1058 03/12/19 1400  DDIMER 1.87*  --  1.86* 1.36*  FERRITIN 1,449*  --   --  1,951*  LDH 316*  --   --  357*  CRP 10.5* 17.4*  --  18.6*   Tmax: 99.8 Oxygen requirements: Room air at home, currently 3 L of oxygen  Antibiotics:none Diuretics: none on ESRD Vitamin C and Zinc: Ordered DVT Prophylaxis: Subcutaneous Heparin  Dose increased due to worsening marker as well as higher BMI Body mass index is 37.97 kg/m.  Remdesivir: Not a candidate due to ESRD Steroids: IV Solu-Medrol Started on 03/11/2019. Actemra: Currently holding due to ulcerative colitis Prone positioning: Patient encouraged to stay in prone position as much as possible.  During this encounter: Patient Isolation: Droplet + Contact HCP PPE: CAPR, gown. gloves Patient PPE: None  The treatment plan and use of medications and known side effects were discussed with patient/family. It was clearly explained that there is no proven definitive  treatment for COVID-19 infection yet. Any medications used here are based on case reports/anecdotal data which are not peer-reviewed and has not been studied using randomized control trials.  Complete risks and long-term side effects are unknown, however in the best clinical judgment they seem to be of some clinical benefit rather than medical risks.  Patient/family agree with the treatment plan and want to receive these treatments as indicated.   2.  ESRD on HD. Continue HD MWF. Appreciate nephrology consultation.  3.  Ulcerative colitis. Patient reports of abdominal cramps as well as diarrhea this morning. Discussed with Dr. Benson Norway patient's primary gastroenterologist. Julianne Rice is currently on hold.  4.  Anemia of chronic kidney disease. H&H stable. Monitor.  5.  Essential hypertension. Blood pressure soft. Currently holding antihypertensive medication.  Diet: Renal diet DVT Prophylaxis: subcutaneous Heparin  Advance goals of care discussion: Full code  Family Communication: no family was present at bedside, at the time of interview.  Discussed with wife on phone on 03/11/2019  Disposition:  Discharge to home.  Consultants: Nephrology Procedures: Hemodialysis  Scheduled Meds: . benzonatate  100 mg Oral TID  . calcitRIOL  1 mcg Oral Q M,W,F-HD  . Chlorhexidine Gluconate Cloth  6 each Topical Q0600  . feeding supplement (NEPRO CARB STEADY)  237 mL Oral BID BM  . guaiFENesin  600 mg Oral BID  . heparin  7,500 Units Subcutaneous Q8H  . methylPREDNISolone (SOLU-MEDROL) injection  40 mg Intravenous Daily  . sevelamer carbonate  2,400 mg Oral TID WC   Continuous Infusions: PRN Meds: acetaminophen, Peppermint Oil, ondansetron **  OR** ondansetron (ZOFRAN) IV Antibiotics: Anti-infectives (From admission, onward)   None       Objective: Physical Exam: Vitals:   03/12/19 1700 03/12/19 1715 03/12/19 1732 03/12/19 1800  BP: 119/72 123/73 123/75 (!) 128/59  Pulse: 72 74 75    Resp:   20   Temp:   98.5 F (36.9 C) 98.4 F (36.9 C)  TempSrc:   Oral Oral  SpO2:   96% 93%  Weight:   103.5 kg   Height:        Intake/Output Summary (Last 24 hours) at 03/12/2019 1833 Last data filed at 03/12/2019 1800 Gross per 24 hour  Intake 700 ml  Output 160 ml  Net 540 ml   Filed Weights   03/09/19 2353 03/12/19 1328 03/12/19 1732  Weight: 103.9 kg 103.3 kg 103.5 kg   General: Alert, Awake and Oriented to Time, Place and Person. Appear in mild distress, affect appropriate Eyes: PERRL, Conjunctiva normal ENT: Oral Mucosa clear moist. Neck: no JVD, no Abnormal Mass Or lumps Cardiovascular: S1 and S2 Present, no Murmur, Peripheral Pulses Present Respiratory: Increased respiratory effort, Bilateral Air entry equal and Decreased, no use of accessory muscle, no Crackles, bilateral  wheezes Abdomen: Bowel Sound present, Soft and no tenderness, no hernia Skin: no redness, no Rash, no induration Extremities: no Pedal edema, no calf tenderness Neurologic: Grossly no focal neuro deficit. Bilaterally Equal motor strength  Data Reviewed: CBC: Recent Labs  Lab 03/10/19 0016 03/11/19 0641 03/12/19 1400  WBC 5.3 6.4 6.3  NEUTROABS 4.0 5.0 5.0  HGB 10.3* 8.6* 9.1*  HCT 31.4* 25.9* 27.2*  MCV 104.0* 103.6* 101.5*  PLT 206 196 509   Basic Metabolic Panel: Recent Labs  Lab 03/10/19 0016 03/11/19 0641 03/12/19 1400  NA 136 136 133*  K 3.3* 4.5 3.8  CL 92* 91* 92*  CO2 29 28 27   GLUCOSE 99 107* 128*  BUN 15 41* 63*  CREATININE 6.39* 12.10* 15.15*  CALCIUM 8.1* 8.2* 8.6*  PHOS  --   --  6.0*    Liver Function Tests: Recent Labs  Lab 03/10/19 0016 03/11/19 0641 03/12/19 1400  AST 31 40 23  ALT 20 20 14   ALKPHOS 21* 16* 20*  BILITOT 0.9 1.2 0.9  PROT 8.0 6.7 7.3  ALBUMIN 3.5 3.0* 2.9*   No results for input(s): LIPASE, AMYLASE in the last 168 hours. No results for input(s): AMMONIA in the last 168 hours. Coagulation Profile: Recent Labs  Lab 03/10/19  0016  INR 0.9   Cardiac Enzymes: No results for input(s): CKTOTAL, CKMB, CKMBINDEX, TROPONINI in the last 168 hours. BNP (last 3 results) No results for input(s): PROBNP in the last 8760 hours. CBG: No results for input(s): GLUCAP in the last 168 hours. Studies: No results found.   Time spent: 35 minutes  Author: Berle Mull, MD [facility] 03/12/2019 6:33 PM  To reach On-call, see care teams to locate the attending and reach out to them via [email or web address]. If 7PM-7AM, please contact night-coverage If you still have difficulty reaching the attending provider, please page the [facility] (Director on Call) for [facility] on amion for assistance.

## 2019-03-13 LAB — CBC WITH DIFFERENTIAL/PLATELET
Abs Immature Granulocytes: 0.08 10*3/uL — ABNORMAL HIGH (ref 0.00–0.07)
Basophils Absolute: 0 10*3/uL (ref 0.0–0.1)
Basophils Relative: 0 %
Eosinophils Absolute: 0 10*3/uL (ref 0.0–0.5)
Eosinophils Relative: 0 %
HCT: 26.9 % — ABNORMAL LOW (ref 39.0–52.0)
Hemoglobin: 8.9 g/dL — ABNORMAL LOW (ref 13.0–17.0)
Immature Granulocytes: 1 %
Lymphocytes Relative: 12 %
Lymphs Abs: 1 10*3/uL (ref 0.7–4.0)
MCH: 34.1 pg — ABNORMAL HIGH (ref 26.0–34.0)
MCHC: 33.1 g/dL (ref 30.0–36.0)
MCV: 103.1 fL — ABNORMAL HIGH (ref 80.0–100.0)
Monocytes Absolute: 0.5 10*3/uL (ref 0.1–1.0)
Monocytes Relative: 6 %
Neutro Abs: 6.4 10*3/uL (ref 1.7–7.7)
Neutrophils Relative %: 81 %
Platelets: 296 10*3/uL (ref 150–400)
RBC: 2.61 MIL/uL — ABNORMAL LOW (ref 4.22–5.81)
RDW: 14.2 % (ref 11.5–15.5)
WBC: 7.9 10*3/uL (ref 4.0–10.5)
nRBC: 0.5 % — ABNORMAL HIGH (ref 0.0–0.2)

## 2019-03-13 LAB — C-REACTIVE PROTEIN: CRP: 13.6 mg/dL — ABNORMAL HIGH (ref <1.0)

## 2019-03-13 LAB — LACTATE DEHYDROGENASE: LDH: 358 U/L — ABNORMAL HIGH (ref 98–192)

## 2019-03-13 LAB — D-DIMER, QUANTITATIVE: D-Dimer, Quant: 1.14 ug/mL-FEU — ABNORMAL HIGH (ref 0.00–0.50)

## 2019-03-13 LAB — FERRITIN: Ferritin: 1902 ng/mL — ABNORMAL HIGH (ref 24–336)

## 2019-03-13 MED ORDER — VITAMIN C 500 MG PO TABS
250.0000 mg | ORAL_TABLET | Freq: Two times a day (BID) | ORAL | Status: DC
  Administered 2019-03-13 – 2019-03-20 (×15): 250 mg via ORAL
  Filled 2019-03-13 (×15): qty 1

## 2019-03-13 MED ORDER — ZINC SULFATE 220 (50 ZN) MG PO CAPS
220.0000 mg | ORAL_CAPSULE | Freq: Every day | ORAL | Status: DC
  Administered 2019-03-13 – 2019-03-20 (×8): 220 mg via ORAL
  Filled 2019-03-13 (×8): qty 1

## 2019-03-13 MED ORDER — RENA-VITE PO TABS
1.0000 | ORAL_TABLET | Freq: Every day | ORAL | Status: DC
  Administered 2019-03-13 – 2019-03-19 (×7): 1 via ORAL
  Filled 2019-03-13 (×7): qty 1

## 2019-03-13 MED ORDER — CHLORHEXIDINE GLUCONATE CLOTH 2 % EX PADS
6.0000 | MEDICATED_PAD | Freq: Every day | CUTANEOUS | Status: DC
  Administered 2019-03-16 – 2019-03-17 (×2): 6 via TOPICAL

## 2019-03-13 MED ORDER — HYDROCOD POLST-CPM POLST ER 10-8 MG/5ML PO SUER
5.0000 mL | Freq: Two times a day (BID) | ORAL | Status: DC
  Administered 2019-03-13 – 2019-03-20 (×15): 5 mL via ORAL
  Filled 2019-03-13 (×15): qty 5

## 2019-03-13 NOTE — Progress Notes (Signed)
Patient ambulated to bathroom on 3L and reported SOB. Checked O2 and sats dropped to 82. Patient then placed on 4L and sats returned to 95-100s.

## 2019-03-13 NOTE — Progress Notes (Signed)
[facility] Progress Note  Patient: Colin [name] [id number]:[id number]   PCP: Gaynelle Arabian, MD DOB: Jul 10, 1966   DOA: 03/09/2019   DOS: 03/13/2019   Date of Service: the patient was seen and examined on 03/13/2019  Brief hospital course: Pt. with PMH of ulcerative colitis, end-stage renal disease on hemodialysis, and hypertension; admitted on 03/09/2019, presented with complaint of shortness of breath, was found to have acute COVID  infection. Currently further plan is continue current supportive care.  Subjective: Patient not feeling better.  Still having shortness of breath and cough.  No nausea no vomiting.  No abdominal pain.  No diarrhea.  Assessment and Plan: 1. Acute COVID-19 Viral illness Lab Results  Component Value Date   SARSCOV2NAA POSITIVE (A) 03/10/2019   SARSCOV2NAA POSITIVE (A) 03/05/2019   RVP not done lymphopenia, Low Procalcitonin,  CXR: hazy bilateral peripheral opacities  Recent Labs    03/11/19 0641 03/11/19 1058 03/12/19 1400 03/13/19 0445  DDIMER  --  1.86* 1.36* 1.14*  FERRITIN  --   --  1,951* 1,902*  LDH  --   --  357* 358*  CRP 17.4*  --  18.6* 13.6*   Tmax: 99.2 Oxygen requirements: Room air at home, currently 3 L of oxygen  Antibiotics:none Diuretics: none on ESRD Vitamin C and Zinc: Ordered DVT Prophylaxis: Subcutaneous Heparin  Dose increased due to worsening marker as well as higher BMI Body mass index is 37.97 kg/m.  Remdesivir: Not a candidate due to ESRD Steroids: IV Solu-Medrol Started on 03/11/2019. Actemra: Currently holding due to ulcerative colitis Prone positioning: Patient encouraged to stay in prone position as much as possible.  During this encounter: Patient Isolation: Droplet + Contact HCP PPE: CAPR, gown. gloves Patient PPE: None  Continue to monitor closely.  Markers are improving.  Anticipating home discharge but still require oxygen.  The treatment plan and use of medications and known side effects were discussed  with patient/family. It was clearly explained that there is no proven definitive treatment for COVID-19 infection yet. Any medications used here are based on case reports/anecdotal data which are not peer-reviewed and has not been studied using randomized control trials.  Complete risks and long-term side effects are unknown, however in the best clinical judgment they seem to be of some clinical benefit rather than medical risks.  Patient/family agree with the treatment plan and want to receive these treatments as indicated.   2.  ESRD on HD. Continue HD MWF. Appreciate nephrology consultation.  3.  Ulcerative colitis. Patient reports of abdominal cramps as well as diarrhea this morning. Discussed with Dr. Benson Norway patient's primary gastroenterologist. Julianne Rice is currently on hold.  4.  Anemia of chronic kidney disease. H&H stable. Monitor.  5.  Essential hypertension. Chronic diastolic CHF on HD Blood pressure soft. Currently holding antihypertensive medication.  Diet: Renal diet DVT Prophylaxis: subcutaneous Heparin  Advance goals of care discussion: Full code  Family Communication: no family was present at bedside, at the time of interview.  Discussed with wife on phone on 03/13/2019  Disposition:  Discharge to home.  Consultants: Nephrology Procedures: Hemodialysis  Scheduled Meds: . benzonatate  100 mg Oral TID  . calcitRIOL  1 mcg Oral Q M,W,F-HD  . Chlorhexidine Gluconate Cloth  6 each Topical Q0600  . [START ON 03/14/2019] Chlorhexidine Gluconate Cloth  6 each Topical Q0600  . chlorpheniramine-HYDROcodone  5 mL Oral Q12H  . feeding supplement (NEPRO CARB STEADY)  237 mL Oral BID BM  . guaiFENesin  600 mg Oral BID  .  heparin  7,500 Units Subcutaneous Q8H  . methylPREDNISolone (SOLU-MEDROL) injection  40 mg Intravenous Daily  . multivitamin  1 tablet Oral QHS  . sevelamer carbonate  2,400 mg Oral TID WC  . vitamin C  250 mg Oral BID  . zinc sulfate  220 mg Oral Daily    Continuous Infusions: PRN Meds: acetaminophen, Peppermint Oil, ondansetron **OR** ondansetron (ZOFRAN) IV Antibiotics: Anti-infectives (From admission, onward)   None       Objective: Physical Exam: Vitals:   03/13/19 0816 03/13/19 1050 03/13/19 1055 03/13/19 1714  BP: 121/83     Pulse: 75     Resp: 20     Temp: 99.2 F (37.3 C)     TempSrc: Oral     SpO2: 98% (!) 82% 98% 90%  Weight:      Height:        Intake/Output Summary (Last 24 hours) at 03/13/2019 1915 Last data filed at 03/13/2019 1101 Gross per 24 hour  Intake 477 ml  Output 175 ml  Net 302 ml   Filed Weights   03/09/19 2353 03/12/19 1328 03/12/19 1732  Weight: 103.9 kg 103.3 kg 103.5 kg   General: Alert, Awake and Oriented to Time, Place and Person. Appear in mild distress, affect appropriate Eyes: PERRL, Conjunctiva normal ENT: Oral Mucosa clear moist. Neck: no JVD, no Abnormal Mass Or lumps Cardiovascular: S1 and S2 Present, no Murmur, Peripheral Pulses Present Respiratory: Increased respiratory effort, Bilateral Air entry equal and Decreased, no use of accessory muscle, no Crackles, bilateral  wheezes Abdomen: Bowel Sound present, Soft and no tenderness, no hernia Skin: no redness, no Rash, no induration Extremities: no Pedal edema, no calf tenderness Neurologic: Grossly no focal neuro deficit. Bilaterally Equal motor strength  Data Reviewed: CBC: Recent Labs  Lab 03/10/19 0016 03/11/19 0641 03/12/19 1400 03/13/19 0445  WBC 5.3 6.4 6.3 7.9  NEUTROABS 4.0 5.0 5.0 6.4  HGB 10.3* 8.6* 9.1* 8.9*  HCT 31.4* 25.9* 27.2* 26.9*  MCV 104.0* 103.6* 101.5* 103.1*  PLT 206 196 280 295   Basic Metabolic Panel: Recent Labs  Lab 03/10/19 0016 03/11/19 0641 03/12/19 1400 03/13/19 0445  NA 136 136 133* 137  K 3.3* 4.5 3.8 3.8  CL 92* 91* 92* 96*  CO2 29 28 27 30   GLUCOSE 99 107* 128* 110*  BUN 15 41* 63* 35*  CREATININE 6.39* 12.10* 15.15* 9.40*  CALCIUM 8.1* 8.2* 8.6* 8.5*  PHOS  --   --  6.0*   --     Liver Function Tests: Recent Labs  Lab 03/10/19 0016 03/11/19 0641 03/12/19 1400 03/13/19 0445  AST 31 40 23 25  ALT 20 20 14 18   ALKPHOS 21* 16* 20* 22*  BILITOT 0.9 1.2 0.9 0.7  PROT 8.0 6.7 7.3 7.0  ALBUMIN 3.5 3.0* 2.9* 2.7*   No results for input(s): LIPASE, AMYLASE in the last 168 hours. No results for input(s): AMMONIA in the last 168 hours. Coagulation Profile: Recent Labs  Lab 03/10/19 0016  INR 0.9   Cardiac Enzymes: No results for input(s): CKTOTAL, CKMB, CKMBINDEX, TROPONINI in the last 168 hours. BNP (last 3 results) No results for input(s): PROBNP in the last 8760 hours. CBG: No results for input(s): GLUCAP in the last 168 hours. Studies: No results found.   Time spent: 35 minutes  Author: Berle Mull, MD [facility] 03/13/2019 7:15 PM  To reach On-call, see care teams to locate the attending and reach out to them via [email or web address]. If 7PM-7AM, please  contact night-coverage If you still have difficulty reaching the attending provider, please page the [facility] (Director on Call) for [facility] on amion for assistance.

## 2019-03-13 NOTE — Progress Notes (Signed)
Renal Navigator confirmed with OP HD Clinic Manager at [facility] that patient will resume 3rd shift schedule at hospital discharge due to [place] positive status. Renal Navigator available for support and assistance as needed.  Alphonzo Cruise, [place] Renal Navigator [phone]

## 2019-03-13 NOTE — Progress Notes (Signed)
[facility] Progress Note  Subjective: feeling SOB off and on, remains on 4L [place].  No CP, no edema.   Vitals:   03/13/19 0441 03/13/19 0816 03/13/19 1050 03/13/19 1055  BP: 102/62 121/83    Pulse: 69 75    Resp: 18 20    Temp: 98.3 F (36.8 C) 99.2 F (37.3 C)    TempSrc: Oral Oral    SpO2: 93% 98% (!) 82% 98%  Weight:      Height:        Inpatient medications: . benzonatate  100 mg Oral TID  . calcitRIOL  1 mcg Oral Q M,W,F-HD  . Chlorhexidine Gluconate Cloth  6 each Topical Q0600  . chlorpheniramine-HYDROcodone  5 mL Oral Q12H  . feeding supplement (NEPRO CARB STEADY)  237 mL Oral BID BM  . guaiFENesin  600 mg Oral BID  . heparin  7,500 Units Subcutaneous Q8H  . methylPREDNISolone (SOLU-MEDROL) injection  40 mg Intravenous Daily  . multivitamin  1 tablet Oral QHS  . sevelamer carbonate  2,400 mg Oral TID WC  . vitamin C  250 mg Oral BID  . zinc sulfate  220 mg Oral Daily    acetaminophen, Peppermint Oil, ondansetron **OR** ondansetron (ZOFRAN) IV    Exam: Gen alert, not coughing today, looks better No jvd or bruits Chest is clear bilat w/o rales/ rhonchi RRR no MRG Abd soft ntnd no mass or ascites +bs Ext no LE edema Neuro is alert, Ox 3 , nf R AVF+bruit   Home meds:  - amlodipine 10/ atenolol 100 hs/ hydralazine 50 hs  - allopurinol 150 qd/ pravastatin 40 hs/ sevelamer carb 3 ac  - humira 40 mg im every 2wk  - bromocriptine 2.5hs/ prn norco/ prn tramadol  - prn's/ vitamins/ supplements   CXR 5/23 > IMPRESSION: Mild patchy infiltrates bilaterally right greater than left.   MWF HD  4h 67mn  F200  450/800   105.5kg  2/2 bath   Hep 8000  RUA AVF  - mircera q 2w, last 5/20 due 6/3, Hb 10.2  - vit d 1.0 ug tiw   Assessment: 1. COVID+ PNA - per primary. Added per primary IV steroids 7-day course starting 5/24. Looks better. 5L [place].  2. ESRD - on HD MWF. HD tomorrow.  3. HTN/ vol - under dry, BP meds on hold, no vol ^ on exam. Keep even  w/ HD. Not eating much.  4. Ulcerative colitis - takes Humira every 2wks 5. Anemia ckd - next esa due 6/3, Hb 8.6 (usual 10's) 6. MBD ckd - cont vit d and binders    [facility] 03/13/2019, 3:11 PM  Iron/TIBC/Ferritin/ %Sat    Component Value Date/Time   FERRITIN 1,902 (H) 03/13/2019 0445   Recent Labs  Lab 03/10/19 0016  03/12/19 1400 03/13/19 0445  NA 136   < > 133* 137  K 3.3*   < > 3.8 3.8  CL 92*   < > 92* 96*  CO2 29   < > 27 30  GLUCOSE 99   < > 128* 110*  BUN 15   < > 63* 35*  CREATININE 6.39*   < > 15.15* 9.40*  CALCIUM 8.1*   < > 8.6* 8.5*  PHOS  --   --  6.0*  --   ALBUMIN 3.5   < > 2.9* 2.7*  INR 0.9  --   --   --    < > = values in  this interval not displayed.   Recent Labs  Lab 03/13/19 0445  AST 25  ALT 18  ALKPHOS 22*  BILITOT 0.7  PROT 7.0   Recent Labs  Lab 03/13/19 0445  WBC 7.9  HGB 8.9*  HCT 26.9*  PLT 296

## 2019-03-14 DIAGNOSIS — K5190 Ulcerative colitis, unspecified, without complications: Secondary | ICD-10-CM

## 2019-03-14 LAB — RENAL FUNCTION PANEL
Albumin: 2.6 g/dL — ABNORMAL LOW (ref 3.5–5.0)
Anion gap: 16 — ABNORMAL HIGH (ref 5–15)
BUN: 60 mg/dL — ABNORMAL HIGH (ref 6–20)
CO2: 26 mmol/L (ref 22–32)
Calcium: 8.6 mg/dL — ABNORMAL LOW (ref 8.9–10.3)
Chloride: 94 mmol/L — ABNORMAL LOW (ref 98–111)
Creatinine, Ser: 11.89 mg/dL — ABNORMAL HIGH (ref 0.61–1.24)
GFR calc Af Amer: 5 mL/min — ABNORMAL LOW (ref >60)
GFR calc non Af Amer: 4 mL/min — ABNORMAL LOW (ref >60)
Glucose, Bld: 100 mg/dL — ABNORMAL HIGH (ref 70–99)
Phosphorus: 6.2 mg/dL — ABNORMAL HIGH (ref 2.5–4.6)
Potassium: 4 mmol/L (ref 3.5–5.1)
Sodium: 136 mmol/L (ref 135–145)

## 2019-03-14 LAB — COMPREHENSIVE METABOLIC PANEL
ALT: 18 U/L (ref 0–44)
AST: 25 U/L (ref 15–41)
Albumin: 2.7 g/dL — ABNORMAL LOW (ref 3.5–5.0)
Alkaline Phosphatase: 22 U/L — ABNORMAL LOW (ref 38–126)
Anion gap: 11 (ref 5–15)
BUN: 35 mg/dL — ABNORMAL HIGH (ref 6–20)
CO2: 30 mmol/L (ref 22–32)
Calcium: 8.5 mg/dL — ABNORMAL LOW (ref 8.9–10.3)
Chloride: 96 mmol/L — ABNORMAL LOW (ref 98–111)
Creatinine, Ser: 9.4 mg/dL — ABNORMAL HIGH (ref 0.61–1.24)
GFR calc Af Amer: 7 mL/min — ABNORMAL LOW (ref >60)
GFR calc non Af Amer: 6 mL/min — ABNORMAL LOW (ref >60)
Glucose, Bld: 110 mg/dL — ABNORMAL HIGH (ref 70–99)
Potassium: 3.8 mmol/L (ref 3.5–5.1)
Sodium: 137 mmol/L (ref 135–145)
Total Bilirubin: 0.7 mg/dL (ref 0.3–1.2)
Total Protein: 7 g/dL (ref 6.5–8.1)

## 2019-03-14 LAB — CBC WITH DIFFERENTIAL/PLATELET
Abs Immature Granulocytes: 0.18 10*3/uL — ABNORMAL HIGH (ref 0.00–0.07)
Basophils Absolute: 0 10*3/uL (ref 0.0–0.1)
Basophils Relative: 0 %
Eosinophils Absolute: 0 10*3/uL (ref 0.0–0.5)
Eosinophils Relative: 0 %
HCT: 25.4 % — ABNORMAL LOW (ref 39.0–52.0)
Hemoglobin: 8.4 g/dL — ABNORMAL LOW (ref 13.0–17.0)
Immature Granulocytes: 2 %
Lymphocytes Relative: 14 %
Lymphs Abs: 1.2 10*3/uL (ref 0.7–4.0)
MCH: 34 pg (ref 26.0–34.0)
MCHC: 33.1 g/dL (ref 30.0–36.0)
MCV: 102.8 fL — ABNORMAL HIGH (ref 80.0–100.0)
Monocytes Absolute: 0.5 10*3/uL (ref 0.1–1.0)
Monocytes Relative: 6 %
Neutro Abs: 6.6 10*3/uL (ref 1.7–7.7)
Neutrophils Relative %: 78 %
Platelets: 323 10*3/uL (ref 150–400)
RBC: 2.47 MIL/uL — ABNORMAL LOW (ref 4.22–5.81)
RDW: 14.3 % (ref 11.5–15.5)
WBC: 8.5 10*3/uL (ref 4.0–10.5)
nRBC: 0.8 % — ABNORMAL HIGH (ref 0.0–0.2)

## 2019-03-14 LAB — LACTATE DEHYDROGENASE: LDH: 340 U/L — ABNORMAL HIGH (ref 98–192)

## 2019-03-14 LAB — C-REACTIVE PROTEIN: CRP: 17.7 mg/dL — ABNORMAL HIGH (ref <1.0)

## 2019-03-14 LAB — FERRITIN: Ferritin: 1558 ng/mL — ABNORMAL HIGH (ref 24–336)

## 2019-03-14 LAB — D-DIMER, QUANTITATIVE: D-Dimer, Quant: 1.14 ug/mL-FEU — ABNORMAL HIGH (ref 0.00–0.50)

## 2019-03-14 MED ORDER — HEPARIN SODIUM (PORCINE) 1000 UNIT/ML DIALYSIS
8000.0000 [IU] | Freq: Once | INTRAMUSCULAR | Status: DC
  Filled 2019-03-14: qty 8

## 2019-03-14 NOTE — TOC Initial Note (Signed)
Transition of Care [facility]) - Initial/Assessment Note    Patient Details  Name: Colin [name] MRN: [id number] Date of Birth: 1966/03/19  Transition of Care [facility]) CM/SW Contact:    Eileen Stanford, LCSW Phone Number: 03/14/2019, 3:58 PM  Clinical Narrative:   Pt is positive COVID-19. CSW spoke with pt via telephone. Readmission risk screening completed, no additional resources needed at this time. Pt will d/c home at d/c.   30 Day Unplanned Readmission Risk Score     ED to Hosp-Admission (Current) from 03/09/2019 in [place] [facility] [place] [facility]  30 Day Unplanned Readmission Risk Score (%)  23 Filed at 03/14/2019 1200     This score is the patient's risk of an unplanned readmission within 30 days of being discharged (0 -100%). The score is based on dignosis, age, lab data, medications, orders, and past utilization.   Low:  0-14.9   Medium: 15-21.9   High: 22-29.9   Extreme: 30 and above        Readmission Risk Prevention Plan 03/14/2019  Transportation Screening Complete  PCP or Specialist Appt within 3-5 Days Complete  HRI or [place] Complete  Social Work Consult for [place] Planning/Counseling Complete  Palliative Care Screening Not Applicable  Medication Review Press photographer) Complete  Some recent data might be hidden            Expected Discharge Plan: Home/Self Care Barriers to Discharge: Continued Medical Work up   Patient Goals and CMS Choice Patient states their goals for this hospitalization and ongoing recovery are:: to get well CMS Medicare.gov Compare Post Acute Care list provided to:: Other (Comment Required)(no list needed or required at this time, pt will d/c home) Choice offered to / list presented to : NA  Expected Discharge Plan and Services Expected Discharge Plan: Home/Self Care In-house Referral: NA Discharge Planning Services: NA Post Acute Care Choice: NA Living arrangements for the past 2 months: Single Family Home    DME Arranged: N/A         HH Arranged: NA          Prior Living Arrangements/Services Living arrangements for the past 2 months: Single Family Home Lives with:: Spouse Patient language and need for interpreter reviewed:: Yes Do you feel safe going back to the place where you live?: Yes      Need for Family Participation in Patient Care: No (Comment) Care giver support system in place?: Yes (comment)   Criminal Activity/Legal Involvement Pertinent to Current Situation/Hospitalization: No - Comment as needed  Activities of Daily Living Home Assistive Devices/Equipment: None ADL Screening (condition at time of admission) Patient's cognitive ability adequate to safely complete daily activities?: Yes Is the patient deaf or have difficulty hearing?: No Does the patient have difficulty seeing, even when wearing glasses/contacts?: No Does the patient have difficulty concentrating, remembering, or making decisions?: No Patient able to express need for assistance with ADLs?: Yes Does the patient have difficulty dressing or bathing?: Yes(per patient a little bit) Independently performs ADLs?: Yes (appropriate for developmental age) Does the patient have difficulty walking or climbing stairs?: No Weakness of Legs: None Weakness of Arms/Hands: None  Permission Sought/Granted   Permission granted to share information with : Yes, Verbal Permission Granted              Emotional Assessment Appearance:: Appears stated age Attitude/Demeanor/Rapport: (patient was appropriate) Affect (typically observed): Accepting, Calm Orientation: : Oriented to Place, Oriented to Self, Oriented to  Time, Oriented to  Situation Alcohol / Substance Use: Not Applicable Psych Involvement: No (comment)  Admission diagnosis:  Hypoxia [R09.02] COVID-19 [U07.1, J98.8] Patient Active Problem List   Diagnosis Date Noted  . Pneumonia due to COVID-19 virus 03/10/2019  . Macrocytic anemia 03/10/2019  .  Acute respiratory failure with hypoxia ([place])   . Hyperprolactinemia ([place]) 01/17/2018  . Screening for malignant neoplasm of prostate 01/17/2018  . Gynecomastia 01/17/2018  . Ingrown toenail 06/21/2017  . Onychomycosis 06/21/2017  . ESRD (end stage renal disease) ([place]) 02/07/2017  . Morbid obesity ([place]) 02/07/2017  . Bilateral leg edema 10/02/2015  . Hyperlipidemia 10/02/2015  . Edema 02/09/2011  . Chronic renal insufficiency 02/09/2011  . ANEMIA, IRON DEFICIENCY 10/16/2007  . Essential hypertension 10/16/2007  . Ulcerative colitis ([place]) 02/13/2007  . MELANOSIS COLI 02/13/2007   PCP:  Gaynelle Arabian, MD Pharmacy:   [id number] [place] [address] [id number]: [phone]: [phone] [address] PontesTransitions of [address] [address]y[address]Phone: [phone]: [phone]    Social Determinants of Health (SDOH) Interventions    Readmission Risk Interventions Readmission Risk Prevention Plan 03/14/2019  Transportation Screening Complete  PCP or Specialist Appt within 3-5 Days Complete  [place] or [place]  Social Work Consult for [place]Planning/Counseling Complete  Palliative Care Screening Not Applicable  Medication Review (Press photographer Complete  Some recent data might be hidden

## 2019-03-14 NOTE — Progress Notes (Signed)
[facility] Progress Note  Patient: Colin [name] VZD:[id number]   PCP: Gaynelle Arabian, MD DOB: [birth year]   DOA: 03/09/2019   DOS: 03/14/2019   Date of Service: the patient was seen and examined on 03/14/2019  Brief hospital course: Pt. with PMH of ulcerative colitis, end-stage renal disease on hemodialysis, and hypertension; admitted on 03/09/2019, presented with complaint of shortness of breath, was found to have acute COVID  infection. Currently further plan is continue current supportive care.  Subjective:   One bm documented last 24hrs, No nausea no vomiting.  No abdominal pain.  No diarrhea. Patient reports feeling better today   Less  shortness of breath and cough.  No fever, on 3liter o3 supplement, baseline not o2 dependent   Assessment and Plan: 1. Acute COVID-19 Viral illness Lab Results  Component Value Date   SARSCOV2NAA POSITIVE (A) 03/10/2019   SARSCOV2NAA POSITIVE (A) 03/05/2019   RVP not done lymphopenia, Low Procalcitonin,  CXR: hazy bilateral peripheral opacities  Recent Labs    03/12/19 1400 03/13/19 0445 03/14/19 0510  DDIMER 1.36* 1.14* 1.14*  FERRITIN 1,951* 1,902* 1,558*  LDH 357* 358* 340*  CRP 18.6* 13.6* 17.7*   Tmax: 99.2 Oxygen requirements: Room air at home, currently 3 L of oxygen  Antibiotics:none Diuretics: none on ESRD Vitamin C and Zinc: Ordered DVT Prophylaxis: Subcutaneous Heparin  Dose increased due to worsening marker as well as higher BMI Body mass index is 38.23 kg/m.  Remdesivir: Not a candidate due to ESRD Steroids: IV Solu-Medrol Started on 03/11/2019. Actemra: Currently holding due to ulcerative colitis Prone positioning: Patient encouraged to stay in prone position as much as possible.  During this encounter: Patient Isolation: Droplet + Contact HCP PPE: CAPR, gown. gloves Patient PPE: None  Continue to monitor closely.  Markers are improving.  Anticipating home discharge but still require oxygen.  The treatment  plan and use of medications and known side effects were discussed with patient/family. It was clearly explained that there is no proven definitive treatment for COVID-19 infection yet. Any medications used here are based on case reports/anecdotal data which are not peer-reviewed and has not been studied using randomized control trials.  Complete risks and long-term side effects are unknown, however in the best clinical judgment they seem to be of some clinical benefit rather than medical risks.  Patient/family agree with the treatment plan and want to receive these treatments as indicated.   2.  ESRD on HD. Continue HD MWF. Appreciate nephrology consultation.  3.  Ulcerative colitis. Patient reports of abdominal cramps as well as diarrhea this morning. Discussed with Dr. Benson Norway patient's primary gastroenterologist. Julianne Rice is currently on hold.  4.  Anemia of chronic kidney disease. H&H stable. Monitor.  5.  Essential hypertension. Chronic diastolic CHF on HD Blood pressure soft. Currently holding antihypertensive medication.  Diet: Renal diet DVT Prophylaxis: subcutaneous Heparin  Advance goals of care discussion: Full code  Family Communication: no family was present at bedside, at the time of interview.  Discussed with wife on phone on 03/13/2019  Disposition:  Discharge to home.  Consultants: Nephrology Procedures: Hemodialysis  Scheduled Meds: . benzonatate  100 mg Oral TID  . calcitRIOL  1 mcg Oral Q M,W,F-HD  . Chlorhexidine Gluconate Cloth  6 each Topical Q0600  . Chlorhexidine Gluconate Cloth  6 each Topical Q0600  . chlorpheniramine-HYDROcodone  5 mL Oral Q12H  . feeding supplement (NEPRO CARB STEADY)  237 mL Oral BID BM  . guaiFENesin  600 mg Oral BID  .  heparin  7,500 Units Subcutaneous Q8H  . [START ON 03/15/2019] heparin  8,000 Units Dialysis Once in dialysis  . methylPREDNISolone (SOLU-MEDROL) injection  40 mg Intravenous Daily  . multivitamin  1 tablet Oral QHS   . sevelamer carbonate  2,400 mg Oral TID WC  . vitamin C  250 mg Oral BID  . zinc sulfate  220 mg Oral Daily   Continuous Infusions: PRN Meds: acetaminophen, Peppermint Oil, ondansetron **OR** ondansetron (ZOFRAN) IV Antibiotics: Anti-infectives (From admission, onward)   None       Objective: Physical Exam: Vitals:   03/14/19 1100 03/14/19 1130 03/14/19 1200 03/14/19 1223  BP: 119/75 127/77 132/79 133/81  Pulse: 84 75 70 72  Resp:      Temp:    98 F (36.7 C)  TempSrc:    Oral  SpO2:    96%  Weight:    104.2 kg  Height:        Intake/Output Summary (Last 24 hours) at 03/14/2019 1914 Last data filed at 03/14/2019 1804 Gross per 24 hour  Intake 510 ml  Output 350 ml  Net 160 ml   Filed Weights   03/12/19 1732 03/14/19 0715 03/14/19 1223  Weight: 103.5 kg 104.2 kg 104.2 kg   General: Alert, Awake and Oriented to Time, Place and Person. Appear in mild distress, affect appropriate Eyes: PERRL, Conjunctiva normal ENT: Oral Mucosa clear moist. Neck: no JVD, no Abnormal Mass Or lumps Cardiovascular: S1 and S2 Present, no Murmur, Peripheral Pulses Present Respiratory: Increased respiratory effort, Bilateral Air entry equal and Decreased, no use of accessory muscle, no Crackles, bilateral  wheezes Abdomen: Bowel Sound present, Soft and no tenderness, no hernia Skin: no redness, no Rash, no induration Extremities: no Pedal edema, no calf tenderness Neurologic: Grossly no focal neuro deficit. Bilaterally Equal motor strength  Data Reviewed: CBC: Recent Labs  Lab 03/10/19 0016 03/11/19 0641 03/12/19 1400 03/13/19 0445 03/14/19 0510  WBC 5.3 6.4 6.3 7.9 8.5  NEUTROABS 4.0 5.0 5.0 6.4 6.6  HGB 10.3* 8.6* 9.1* 8.9* 8.4*  HCT 31.4* 25.9* 27.2* 26.9* 25.4*  MCV 104.0* 103.6* 101.5* 103.1* 102.8*  PLT 206 196 280 296 116   Basic Metabolic Panel: Recent Labs  Lab 03/10/19 0016 03/11/19 0641 03/12/19 1400 03/13/19 0445 03/14/19 0500  NA 136 136 133* 137 136  K  3.3* 4.5 3.8 3.8 4.0  CL 92* 91* 92* 96* 94*  CO2 29 28 27 30 26   GLUCOSE 99 107* 128* 110* 100*  BUN 15 41* 63* 35* 60*  CREATININE 6.39* 12.10* 15.15* 9.40* 11.89*  CALCIUM 8.1* 8.2* 8.6* 8.5* 8.6*  PHOS  --   --  6.0*  --  6.2*    Liver Function Tests: Recent Labs  Lab 03/10/19 0016 03/11/19 0641 03/12/19 1400 03/13/19 0445 03/14/19 0500  AST 31 40 23 25  --   ALT 20 20 14 18   --   ALKPHOS 21* 16* 20* 22*  --   BILITOT 0.9 1.2 0.9 0.7  --   PROT 8.0 6.7 7.3 7.0  --   ALBUMIN 3.5 3.0* 2.9* 2.7* 2.6*   No results for input(s): LIPASE, AMYLASE in the last 168 hours. No results for input(s): AMMONIA in the last 168 hours. Coagulation Profile: Recent Labs  Lab 03/10/19 0016  INR 0.9   Cardiac Enzymes: No results for input(s): CKTOTAL, CKMB, CKMBINDEX, TROPONINI in the last 168 hours. BNP (last 3 results) No results for input(s): PROBNP in the last 8760 hours. CBG: No  results for input(s): GLUCAP in the last 168 hours. Studies: No results found.   Time spent: 25 minutes  Author: Florencia Reasons MD PhD [facility] 03/14/2019 7:14 PM  To reach On-call, see care teams to locate the attending and reach out to them via [email or web address]. If 7PM-7AM, please contact night-coverage If you still have difficulty reaching the attending provider, please page the [facility] (Director on Call) for [facility] on amion for assistance.

## 2019-03-14 NOTE — Progress Notes (Signed)
[facility] Progress Note  Subjective: not examined directly today, stable per PMD, 3L [place]  Vitals:   03/14/19 1100 03/14/19 1130 03/14/19 1200 03/14/19 1223  BP: 119/75 127/77 132/79 133/81  Pulse: 84 75 70 72  Resp:      Temp:    98 F (36.7 C)  TempSrc:    Oral  SpO2:    96%  Weight:    104.2 kg  Height:        Inpatient medications: . benzonatate  100 mg Oral TID  . calcitRIOL  1 mcg Oral Q M,W,F-HD  . Chlorhexidine Gluconate Cloth  6 each Topical Q0600  . Chlorhexidine Gluconate Cloth  6 each Topical Q0600  . chlorpheniramine-HYDROcodone  5 mL Oral Q12H  . feeding supplement (NEPRO CARB STEADY)  237 mL Oral BID BM  . guaiFENesin  600 mg Oral BID  . heparin  7,500 Units Subcutaneous Q8H  . [START ON 03/15/2019] heparin  8,000 Units Dialysis Once in dialysis  . methylPREDNISolone (SOLU-MEDROL) injection  40 mg Intravenous Daily  . multivitamin  1 tablet Oral QHS  . sevelamer carbonate  2,400 mg Oral TID WC  . vitamin C  250 mg Oral BID  . zinc sulfate  220 mg Oral Daily    acetaminophen, Peppermint Oil, ondansetron **OR** ondansetron (ZOFRAN) IV    Exam:  Patient not examined directly given COVID-19 + status, utilizing exam of the primary team and observations of RN's.     Home meds:  - amlodipine 10/ atenolol 100 hs/ hydralazine 50 hs  - allopurinol 150 qd/ pravastatin 40 hs/ sevelamer carb 3 ac  - humira 40 mg im every 2wk  - bromocriptine 2.5hs/ prn norco/ prn tramadol  - prn's/ vitamins/ supplements   CXR 5/23 > IMPRESSION: Mild patchy infiltrates bilaterally right greater than left.   MWF HD  4h 41mn  F200  450/800   105.5kg  2/2 bath   Hep 8000  RUA AVF  - mircera q 2w, last 5/20 due 6/3, Hb 10.2  - vit d 1.0 ug tiw   Assessment: 1. COVID+ PNA - per primary.  Getting IV steroids 7-day course started 5/24. 3L [place] today.  2. ESRD - on HD MWF. HD today.  3. HTN/ vol - under dry, BP meds on hold, no UF today 4. Ulcerative colitis -  takes Humira every 2wks 5. Anemia ckd - next esa due 6/3, Hb 8- 10 range here (usual 10's) 6. MBD ckd - cont vit d and binders    [facility] 03/14/2019, 1:24 PM  Iron/TIBC/Ferritin/ %Sat    Component Value Date/Time   FERRITIN 1,558 (H) 03/14/2019 0510   Recent Labs  Lab 03/10/19 0016  03/14/19 0500  NA 136   < > 136  K 3.3*   < > 4.0  CL 92*   < > 94*  CO2 29   < > 26  GLUCOSE 99   < > 100*  BUN 15   < > 60*  CREATININE 6.39*   < > 11.89*  CALCIUM 8.1*   < > 8.6*  PHOS  --    < > 6.2*  ALBUMIN 3.5   < > 2.6*  INR 0.9  --   --    < > = values in this interval not displayed.   Recent Labs  Lab 03/13/19 0445  AST 25  ALT 18  ALKPHOS 22*  BILITOT 0.7  PROT 7.0   Recent Labs  Lab 03/14/19 0510  WBC 8.5  HGB 8.4*  HCT 25.4*  PLT 323

## 2019-03-15 LAB — RENAL FUNCTION PANEL
Albumin: 2.5 g/dL — ABNORMAL LOW (ref 3.5–5.0)
Anion gap: 14 (ref 5–15)
BUN: 48 mg/dL — ABNORMAL HIGH (ref 6–20)
CO2: 26 mmol/L (ref 22–32)
Calcium: 8.4 mg/dL — ABNORMAL LOW (ref 8.9–10.3)
Chloride: 96 mmol/L — ABNORMAL LOW (ref 98–111)
Creatinine, Ser: 9.47 mg/dL — ABNORMAL HIGH (ref 0.61–1.24)
GFR calc Af Amer: 6 mL/min — ABNORMAL LOW (ref >60)
GFR calc non Af Amer: 6 mL/min — ABNORMAL LOW (ref >60)
Glucose, Bld: 105 mg/dL — ABNORMAL HIGH (ref 70–99)
Phosphorus: 5.7 mg/dL — ABNORMAL HIGH (ref 2.5–4.6)
Potassium: 4.5 mmol/L (ref 3.5–5.1)
Sodium: 136 mmol/L (ref 135–145)

## 2019-03-15 LAB — CBC WITH DIFFERENTIAL/PLATELET
Abs Immature Granulocytes: 0.2 10*3/uL — ABNORMAL HIGH (ref 0.00–0.07)
Basophils Absolute: 0 10*3/uL (ref 0.0–0.1)
Basophils Relative: 0 %
Eosinophils Absolute: 0 10*3/uL (ref 0.0–0.5)
Eosinophils Relative: 0 %
HCT: 25.2 % — ABNORMAL LOW (ref 39.0–52.0)
Hemoglobin: 8.2 g/dL — ABNORMAL LOW (ref 13.0–17.0)
Lymphocytes Relative: 10 %
Lymphs Abs: 0.8 10*3/uL (ref 0.7–4.0)
MCH: 33.6 pg (ref 26.0–34.0)
MCHC: 32.5 g/dL (ref 30.0–36.0)
MCV: 103.3 fL — ABNORMAL HIGH (ref 80.0–100.0)
Metamyelocytes Relative: 2 %
Monocytes Absolute: 0.5 10*3/uL (ref 0.1–1.0)
Monocytes Relative: 7 %
Neutro Abs: 6.2 10*3/uL (ref 1.7–7.7)
Neutrophils Relative %: 81 %
Platelets: 338 10*3/uL (ref 150–400)
RBC: 2.44 MIL/uL — ABNORMAL LOW (ref 4.22–5.81)
RDW: 14.4 % (ref 11.5–15.5)
WBC: 7.6 10*3/uL (ref 4.0–10.5)
nRBC: 0.7 % — ABNORMAL HIGH (ref 0.0–0.2)
nRBC: 1 /100 WBC — ABNORMAL HIGH

## 2019-03-15 LAB — CULTURE, BLOOD (ROUTINE X 2)
Culture: NO GROWTH
Culture: NO GROWTH
Special Requests: ADEQUATE

## 2019-03-15 LAB — LACTATE DEHYDROGENASE: LDH: 366 U/L — ABNORMAL HIGH (ref 98–192)

## 2019-03-15 LAB — C-REACTIVE PROTEIN: CRP: 23.1 mg/dL — ABNORMAL HIGH (ref <1.0)

## 2019-03-15 LAB — FERRITIN: Ferritin: 1498 ng/mL — ABNORMAL HIGH (ref 24–336)

## 2019-03-15 LAB — D-DIMER, QUANTITATIVE: D-Dimer, Quant: 1.23 ug/mL-FEU — ABNORMAL HIGH (ref 0.00–0.50)

## 2019-03-15 MED ORDER — CHLORHEXIDINE GLUCONATE CLOTH 2 % EX PADS: 6.0000 | MEDICATED_PAD | Freq: Every day | CUTANEOUS | Status: DC

## 2019-03-15 NOTE — Progress Notes (Signed)
[facility] Progress Note  Subjective: d/w triad MD, no new issues, stable  Vitals:   03/15/19 0520 03/15/19 0523 03/15/19 0525 03/15/19 0808  BP:    120/83  Pulse:    69  Resp:    17  Temp:    99.3 F (37.4 C)  TempSrc:    Oral  SpO2: (!) 77% (!) 88% 93% 90%  Weight:      Height:        Inpatient medications: . benzonatate  100 mg Oral TID  . calcitRIOL  1 mcg Oral Q M,W,F-HD  . Chlorhexidine Gluconate Cloth  6 each Topical Q0600  . Chlorhexidine Gluconate Cloth  6 each Topical Q0600  . chlorpheniramine-HYDROcodone  5 mL Oral Q12H  . feeding supplement (NEPRO CARB STEADY)  237 mL Oral BID BM  . guaiFENesin  600 mg Oral BID  . heparin  7,500 Units Subcutaneous Q8H  . heparin  8,000 Units Dialysis Once in dialysis  . methylPREDNISolone (SOLU-MEDROL) injection  40 mg Intravenous Daily  . multivitamin  1 tablet Oral QHS  . sevelamer carbonate  2,400 mg Oral TID WC  . vitamin C  250 mg Oral BID  . zinc sulfate  220 mg Oral Daily    acetaminophen, Peppermint Oil, ondansetron **OR** ondansetron (ZOFRAN) IV    Exam:  Patient not examined directly given COVID-19 + status, utilizing exam of the primary team and observations of RN's.     Home meds:  - amlodipine 10/ atenolol 100 hs/ hydralazine 50 hs  - allopurinol 150 qd/ pravastatin 40 hs/ sevelamer carb 3 ac  - humira 40 mg im every 2wk  - bromocriptine 2.5hs/ prn norco/ prn tramadol  - prn's/ vitamins/ supplements   CXR 5/23 > IMPRESSION: Mild patchy infiltrates bilaterally right greater than left.   MWF HD  4h 70mn  F200  450/800   105.5kg  2/2 bath   Hep 8000  RUA AVF  - mircera q 2w, last 5/20 due 6/3, Hb 10.2  - vit d 1.0 ug tiw   Assessment: 1. COVID+ PNA - per primary.  Getting IV steroids 7-day course started 5/24. Stable resp status on 5-6 L [place].  When stable for dc he can be dc'd home [facility]  2. ESRD - on HD MWF.  HD Friday.  3. HTN/ vol - under dry wt, keeping even on HD, BP meds on hold. BP's good 4. Ulcerative colitis - takes Humira every 2wks 5. Anemia ckd - next esa due 6/3, Hb 8- 10 range here (usual 10's) 6. MBD ckd - cont vit d and binders    [facility] 03/15/2019, 1:18 PM  Iron/TIBC/Ferritin/ %Sat    Component Value Date/Time   FERRITIN 1,498 (H) 03/15/2019 0500   Recent Labs  Lab 03/10/19 0016  03/15/19 0500  NA 136   < > 136  K 3.3*   < > 4.5  CL 92*   < > 96*  CO2 29   < > 26  GLUCOSE 99   < > 105*  BUN 15   < > 48*  CREATININE 6.39*   < > 9.47*  CALCIUM 8.1*   < > 8.4*  PHOS  --    < > 5.7*  ALBUMIN 3.5   < > 2.5*  INR 0.9  --   --    < > = values in this interval not displayed.  Recent Labs  Lab 03/13/19 0445  AST 25  ALT 18  ALKPHOS 22*  BILITOT 0.7  PROT 7.0   Recent Labs  Lab 03/15/19 0500  WBC 7.6  HGB 8.2*  HCT 25.2*  PLT 338

## 2019-03-15 NOTE — Progress Notes (Signed)
[facility] Progress Note  Patient: Colin [name] ZSM:[id number]   PCP: Gaynelle Arabian, MD DOB: 1966-08-21   DOA: 03/09/2019   DOS: 03/15/2019   Date of Service: the patient was seen and examined on 03/15/2019  Brief hospital course: Pt. with PMH of ulcerative colitis, end-stage renal disease on hemodialysis, and hypertension; admitted on 03/09/2019, presented with complaint of shortness of breath, was found to have acute COVID  infection. Currently further plan is continue current supportive care.  Subjective:    Patient is on increase o2 at 6liters, he states feeling fine at rest, but significant dyspnea with activity,  He reports less cough, tmax 99.9 last 24hrs  No nausea no vomiting.  No abdominal pain.  No diarrhea.     Assessment and Plan: 1. Acute COVID-19 Viral illness, acute hypoxic respiratory failure ( baseline not o2 dependent) Lab Results  Component Value Date   SARSCOV2NAA POSITIVE (A) 03/10/2019   SARSCOV2NAA POSITIVE (A) 03/05/2019   RVP not done lymphopenia, Low Procalcitonin,  CXR: hazy bilateral peripheral opacities  Recent Labs    03/13/19 0445 03/14/19 0510 03/15/19 0500  DDIMER 1.14* 1.14* 1.23*  FERRITIN 1,902* 1,558* 1,498*  LDH 358* 340* 366*  CRP 13.6* 17.7* 23.1*   Tmax: 99.2 Oxygen requirements: Room air at home, currently 3 L of oxygen  Antibiotics:none Diuretics: none on ESRD Vitamin C and Zinc: Ordered DVT Prophylaxis: Subcutaneous Heparin  Dose increased due to worsening marker as well as higher BMI Body mass index is 38.23 kg/m.  Remdesivir: Not a candidate due to ESRD Steroids: IV Solu-Medrol Started on 03/11/2019. Actemra: Currently holding due to ulcerative colitis Prone positioning: Patient encouraged to stay in prone position as much as possible.  During this encounter: Patient Isolation: Droplet + Contact HCP PPE: CAPR, gown. gloves Patient PPE: None  Continue to monitor closely.  Markers are improving.  Anticipating  home discharge but still require oxygen.  The treatment plan and use of medications and known side effects were discussed with patient/family. It was clearly explained that there is no proven definitive treatment for COVID-19 infection yet. Any medications used here are based on case reports/anecdotal data which are not peer-reviewed and has not been studied using randomized control trials.  Complete risks and long-term side effects are unknown, however in the best clinical judgment they seem to be of some clinical benefit rather than medical risks.  Patient/family agree with the treatment plan and want to receive these treatments as indicated.   2.  ESRD on HD. Continue HD MWF. Appreciate nephrology consultation.  3.  Ulcerative colitis. Patient reports of abdominal cramps as well as diarrhea this morning. Discussed with Dr. Benson Norway patient's primary gastroenterologist. Julianne Rice is currently on hold.  4.  Anemia of chronic kidney disease. H&H stable. Monitor.  5.  Essential hypertension. Chronic diastolic CHF on HD Blood pressure soft. Currently holding antihypertensive medication.  Diet: Renal diet DVT Prophylaxis: subcutaneous Heparin  Advance goals of care discussion: Full code  Family Communication: no family was present at bedside, at the time of interview.  Discussed with wife on phone on 03/13/2019  Disposition:  Discharge to home when able to wean down o2, will need to check with his dialysis center regarding covid protocol.   Consultants: Nephrology Procedures: Hemodialysis  Scheduled Meds: . benzonatate  100 mg Oral TID  . calcitRIOL  1 mcg Oral Q M,W,F-HD  . Chlorhexidine Gluconate Cloth  6 each Topical Q0600  . Chlorhexidine Gluconate Cloth  6 each Topical Q0600  .  chlorpheniramine-HYDROcodone  5 mL Oral Q12H  . feeding supplement (NEPRO CARB STEADY)  237 mL Oral BID BM  . guaiFENesin  600 mg Oral BID  . heparin  7,500 Units Subcutaneous Q8H  . heparin  8,000 Units  Dialysis Once in dialysis  . methylPREDNISolone (SOLU-MEDROL) injection  40 mg Intravenous Daily  . multivitamin  1 tablet Oral QHS  . sevelamer carbonate  2,400 mg Oral TID WC  . vitamin C  250 mg Oral BID  . zinc sulfate  220 mg Oral Daily   Continuous Infusions: PRN Meds: acetaminophen, Peppermint Oil, ondansetron **OR** ondansetron (ZOFRAN) IV Antibiotics: Anti-infectives (From admission, onward)   None       Objective: Physical Exam: Vitals:   03/15/19 0523 03/15/19 0525 03/15/19 0808 03/15/19 1511  BP:   120/83 125/88  Pulse:   69 70  Resp:   17 18  Temp:   99.3 F (37.4 C) 98.7 F (37.1 C)  TempSrc:   Oral Oral  SpO2: (!) 88% 93% 90% 93%  Weight:      Height:        Intake/Output Summary (Last 24 hours) at 03/15/2019 1621 Last data filed at 03/15/2019 1051 Gross per 24 hour  Intake 340 ml  Output 400 ml  Net -60 ml   Filed Weights   03/12/19 1732 03/14/19 0715 03/14/19 1223  Weight: 103.5 kg 104.2 kg 104.2 kg   General: Alert, Awake and Oriented to Time, Place and Person. Appear in mild distress, affect appropriate Eyes: PERRL, Conjunctiva normal ENT: Oral Mucosa clear moist. Neck: no JVD, no Abnormal Mass Or lumps Cardiovascular: S1 and S2 Present, no Murmur, Peripheral Pulses Present Respiratory: Increased respiratory effort, Bilateral Air entry equal and Decreased, no use of accessory muscle, no Crackles, bilateral  wheezes Abdomen: Bowel Sound present, Soft and no tenderness, no hernia Skin: no redness, no Rash, no induration Extremities: no Pedal edema, no calf tenderness Neurologic: Grossly no focal neuro deficit. Bilaterally Equal motor strength  Data Reviewed: CBC: Recent Labs  Lab 03/11/19 0641 03/12/19 1400 03/13/19 0445 03/14/19 0510 03/15/19 0500  WBC 6.4 6.3 7.9 8.5 7.6  NEUTROABS 5.0 5.0 6.4 6.6 6.2  HGB 8.6* 9.1* 8.9* 8.4* 8.2*  HCT 25.9* 27.2* 26.9* 25.4* 25.2*  MCV 103.6* 101.5* 103.1* 102.8* 103.3*  PLT 196 280 296 323 562    Basic Metabolic Panel: Recent Labs  Lab 03/11/19 0641 03/12/19 1400 03/13/19 0445 03/14/19 0500 03/15/19 0500  NA 136 133* 137 136 136  K 4.5 3.8 3.8 4.0 4.5  CL 91* 92* 96* 94* 96*  CO2 28 27 30 26 26   GLUCOSE 107* 128* 110* 100* 105*  BUN 41* 63* 35* 60* 48*  CREATININE 12.10* 15.15* 9.40* 11.89* 9.47*  CALCIUM 8.2* 8.6* 8.5* 8.6* 8.4*  PHOS  --  6.0*  --  6.2* 5.7*    Liver Function Tests: Recent Labs  Lab 03/10/19 0016 03/11/19 0641 03/12/19 1400 03/13/19 0445 03/14/19 0500 03/15/19 0500  AST 31 40 23 25  --   --   ALT 20 20 14 18   --   --   ALKPHOS 21* 16* 20* 22*  --   --   BILITOT 0.9 1.2 0.9 0.7  --   --   PROT 8.0 6.7 7.3 7.0  --   --   ALBUMIN 3.5 3.0* 2.9* 2.7* 2.6* 2.5*   No results for input(s): LIPASE, AMYLASE in the last 168 hours. No results for input(s): AMMONIA in the last 168  hours. Coagulation Profile: Recent Labs  Lab 03/10/19 0016  INR 0.9   Cardiac Enzymes: No results for input(s): CKTOTAL, CKMB, CKMBINDEX, TROPONINI in the last 168 hours. BNP (last 3 results) No results for input(s): PROBNP in the last 8760 hours. CBG: No results for input(s): GLUCAP in the last 168 hours. Studies: No results found.   Time spent: 25 minutes  Author: Florencia Reasons MD PhD [facility] 03/15/2019 4:21 PM  To reach On-call, see care teams to locate the attending and reach out to them via [email or web address]. If 7PM-7AM, please contact night-coverage If you still have difficulty reaching the attending provider, please page the [facility] (Director on Call) for [facility] on amion for assistance.

## 2019-03-16 ENCOUNTER — Inpatient Hospital Stay (HOSPITAL_COMMUNITY): Payer: Commercial Managed Care - PPO

## 2019-03-16 LAB — RENAL FUNCTION PANEL
Albumin: 2.4 g/dL — ABNORMAL LOW (ref 3.5–5.0)
Anion gap: 18 — ABNORMAL HIGH (ref 5–15)
BUN: 81 mg/dL — ABNORMAL HIGH (ref 6–20)
CO2: 24 mmol/L (ref 22–32)
Calcium: 9.1 mg/dL (ref 8.9–10.3)
Chloride: 95 mmol/L — ABNORMAL LOW (ref 98–111)
Creatinine, Ser: 11.71 mg/dL — ABNORMAL HIGH (ref 0.61–1.24)
GFR calc Af Amer: 5 mL/min — ABNORMAL LOW (ref >60)
GFR calc non Af Amer: 4 mL/min — ABNORMAL LOW (ref >60)
Glucose, Bld: 91 mg/dL (ref 70–99)
Phosphorus: 6.4 mg/dL — ABNORMAL HIGH (ref 2.5–4.6)
Potassium: 4.2 mmol/L (ref 3.5–5.1)
Sodium: 137 mmol/L (ref 135–145)

## 2019-03-16 LAB — D-DIMER, QUANTITATIVE: D-Dimer, Quant: 1.57 ug/mL-FEU — ABNORMAL HIGH (ref 0.00–0.50)

## 2019-03-16 LAB — LACTATE DEHYDROGENASE: LDH: 325 U/L — ABNORMAL HIGH (ref 98–192)

## 2019-03-16 LAB — FERRITIN: Ferritin: 946 ng/mL — ABNORMAL HIGH (ref 24–336)

## 2019-03-16 MED ORDER — HEPARIN SODIUM (PORCINE) 1000 UNIT/ML DIALYSIS
8000.0000 [IU] | Freq: Once | INTRAMUSCULAR | Status: AC
  Administered 2019-03-19: 8000 [IU] via INTRAVENOUS_CENTRAL

## 2019-03-16 MED ORDER — HEPARIN SODIUM (PORCINE) 1000 UNIT/ML IJ SOLN
INTRAMUSCULAR | Status: AC
  Filled 2019-03-16: qty 8

## 2019-03-16 MED ORDER — ALBUTEROL SULFATE HFA 108 (90 BASE) MCG/ACT IN AERS
2.0000 | INHALATION_SPRAY | Freq: Four times a day (QID) | RESPIRATORY_TRACT | Status: DC | PRN
  Filled 2019-03-16: qty 6.7

## 2019-03-16 NOTE — Progress Notes (Signed)
[facility] Progress Note  Subjective: seen in room, still having DOE, not so much at rest, occ cough, o/w no c/o's.   Vitals:   03/16/19 1015 03/16/19 1030 03/16/19 1045 03/16/19 1100  BP: 122/80 114/83 121/84 129/87  Pulse: 63 70 67 (!) 57  Resp:      Temp:      TempSrc:      SpO2:      Weight:      Height:        Inpatient medications: . benzonatate  100 mg Oral TID  . calcitRIOL  1 mcg Oral Q M,W,F-HD  . Chlorhexidine Gluconate Cloth  6 each Topical Q0600  . Chlorhexidine Gluconate Cloth  6 each Topical Q0600  . chlorpheniramine-HYDROcodone  5 mL Oral Q12H  . feeding supplement (NEPRO CARB STEADY)  237 mL Oral BID BM  . guaiFENesin  600 mg Oral BID  . heparin      . heparin  7,500 Units Subcutaneous Q8H  . heparin  8,000 Units Dialysis Once in dialysis  . [START ON 03/17/2019] heparin  8,000 Units Dialysis Once in dialysis  . methylPREDNISolone (SOLU-MEDROL) injection  40 mg Intravenous Daily  . multivitamin  1 tablet Oral QHS  . sevelamer carbonate  2,400 mg Oral TID WC  . vitamin C  250 mg Oral BID  . zinc sulfate  220 mg Oral Daily    acetaminophen, Peppermint Oil, ondansetron **OR** ondansetron (ZOFRAN) IV    Exam:  Genalert, not coughing, looks better No jvd or bruits Chestis clear bilat w/o rales/ rhonchi RRR no MRG Abd soft ntnd no mass or ascites +bs Extno LE edema Neuro is alert, Ox 3 , nf R AVF+bruit    Home meds:  - amlodipine 10/ atenolol 100 hs/ hydralazine 50 hs  - allopurinol 150 qd/ pravastatin 40 hs/ sevelamer carb 3 ac  - humira 40 mg im every 2wk  - bromocriptine 2.5hs/ prn norco/ prn tramadol  - prn's/ vitamins/ supplements   CXR 5/23 > IMPRESSION: Mild patchy infiltrates bilaterally right greater than left.   MWF HD  4h 82mn  F200  450/800   105.5kg  2/2 bath   Hep 8000  RUA AVF  - mircera q 2w, last 5/20 due 6/3, Hb 10.2  - vit d 1.0 ug tiw   Assessment: 1. COVID+ PNA - per primary.  Getting IV  steroids 7-day course started 5/24. Stable resp status on 5-6 L [place].  When stable for dc he can be dc[facility] home [facility]  2. ESRD - on HD MWF. HD Friday.  3. HTN/ vol - BP's good, under dry 2kg, try for 1L today 4. Ulcerative colitis - takes Humira every 2wks 5. Anemia ckd - next esa due 6/3, Hb 8- 10 range here (usual 10's) 6. MBD ckd - cont vit d and binders    [facility] 03/16/2019, 12:34 PM  Iron/TIBC/Ferritin/ %Sat    Component Value Date/Time   FERRITIN 946 (H) 03/16/2019 0933   Recent Labs  Lab 03/10/19 0016  03/16/19 0500  NA 136   < > 137  K 3.3*   < > 4.2  CL 92*   < > 95*  CO2 29   < > 24  GLUCOSE 99   < > 91  BUN 15   < > 81*  CREATININE 6.39*   < > 11.71*  CALCIUM 8.1*   < >  9.1  PHOS  --    < > 6.4*  ALBUMIN 3.5   < > 2.4*  INR 0.9  --   --    < > = values in this interval not displayed.   Recent Labs  Lab 03/13/19 0445  AST 25  ALT 18  ALKPHOS 22*  BILITOT 0.7  PROT 7.0   Recent Labs  Lab 03/15/19 0500  WBC 7.6  HGB 8.2*  HCT 25.2*  PLT 338

## 2019-03-16 NOTE — Progress Notes (Addendum)
[facility] Progress Note  Patient: Colin [name] [id number]:[id number]   PCP: Gaynelle Arabian, MD DOB: 16-Mar-1966   DOA: 03/09/2019   DOS: 03/16/2019   Date of Service: the patient was seen and examined on 03/16/2019  Brief hospital course: Pt. with PMH of ulcerative colitis, end-stage renal disease on hemodialysis, and hypertension; admitted on 03/09/2019, presented with complaint of shortness of breath, was found to have acute COVID  infection. Currently further plan is continue current supportive care.  Subjective:    Patient is on increase o2 at 6liters, he states feeling fine at rest, but remain significant dyspnea with activity,  He reports less cough, tmax 99.2 last 24hrs  No nausea no vomiting.  No abdominal pain.  No diarrhea.     Assessment and Plan: 1. Acute COVID-19 Viral illness, acute hypoxic respiratory failure ( baseline not o2 dependent) Lab Results  Component Value Date   SARSCOV2NAA POSITIVE (A) 03/10/2019   SARSCOV2NAA POSITIVE (A) 03/05/2019   RVP not done lymphopenia, Low Procalcitonin,  CXR: hazy bilateral peripheral opacities  Recent Labs    03/14/19 0510 03/15/19 0500 03/16/19 0500 03/16/19 0933  DDIMER 1.14* 1.23* 1.57*  --   FERRITIN 1,558* 1,498*  --  946*  LDH 340* 366* 325*  --   CRP 17.7* 23.1*  --   --    Tmax: 99.2 Oxygen requirements: Room air at home, currently 3 L of oxygen  Antibiotics:none Diuretics: none on ESRD Vitamin C and Zinc: Ordered DVT Prophylaxis: Subcutaneous Heparin  Dose increased due to worsening marker as well as higher BMI Body mass index is 36.98 kg/m.  Remdesivir: Not a candidate due to ESRD Steroids: IV Solu-Medrol Started on 03/11/2019. Actemra: Currently holding due to ulcerative colitis Prone positioning: Patient encouraged to stay in prone position as much as possible.  During this encounter: Patient Isolation: Droplet + Contact HCP PPE: CAPR, gown. gloves Patient PPE: None  Continue to monitor closely.   Now has increased oxygen requirement and worseinng cxr, Markers though appear not increasing , discussed with pulmonary Dr Loanne Drilling , will follow recommendation  The treatment plan and use of medications and known side effects were discussed with patient/family. It was clearly explained that there is no proven definitive treatment for COVID-19 infection yet. Any medications used here are based on case reports/anecdotal data which are not peer-reviewed and has not been studied using randomized control trials.  Complete risks and long-term side effects are unknown, however in the best clinical judgment they seem to be of some clinical benefit rather than medical risks.  Patient/family agree with the treatment plan and want to receive these treatments as indicated.   2.  ESRD on HD. Continue HD MWF. Appreciate nephrology consultation.  3.  Ulcerative colitis. Patient reports of abdominal cramps as well as diarrhea this morning. Discussed with Dr. Benson Norway patient's primary gastroenterologist. Julianne Rice is currently on hold.  4.  Anemia of chronic kidney disease. H&H stable. Monitor.  5.  Essential hypertension. Chronic diastolic CHF on HD Blood pressure soft. Currently holding antihypertensive medication.  Diet: Renal diet DVT Prophylaxis: subcutaneous Heparin  Advance goals of care discussion: Full code  Family Communication: no family was present at bedside, at the time of interview.  Discussed with wife on phone on 03/13/2019  Disposition:  Discharge to home when able to wean down o2, Once able to discharge, if remain positive can go to [facility] If repeat test negative prior to discharge , can go back to regular outpatient dialysis center  Consultants: Nephrology Procedures: Hemodialysis  Scheduled Meds: . benzonatate  100 mg Oral TID  . calcitRIOL  1 mcg Oral Q M,W,F-HD  . Chlorhexidine Gluconate Cloth  6 each Topical Q0600  . Chlorhexidine Gluconate Cloth   6 each Topical Q0600  . chlorpheniramine-HYDROcodone  5 mL Oral Q12H  . feeding supplement (NEPRO CARB STEADY)  237 mL Oral BID BM  . guaiFENesin  600 mg Oral BID  . heparin      . heparin  7,500 Units Subcutaneous Q8H  . heparin  8,000 Units Dialysis Once in dialysis  . [START ON 03/17/2019] heparin  8,000 Units Dialysis Once in dialysis  . methylPREDNISolone (SOLU-MEDROL) injection  40 mg Intravenous Daily  . multivitamin  1 tablet Oral QHS  . sevelamer carbonate  2,400 mg Oral TID WC  . vitamin C  250 mg Oral BID  . zinc sulfate  220 mg Oral Daily   Continuous Infusions: PRN Meds: acetaminophen, Peppermint Oil, ondansetron **OR** ondansetron (ZOFRAN) IV Antibiotics: Anti-infectives (From admission, onward)   None       Objective: Physical Exam: Vitals:   03/16/19 1045 03/16/19 1100 03/16/19 1110 03/16/19 1536  BP: 121/84 129/87 127/87 117/79  Pulse: 67 (!) 57 69 80  Resp:   18 16  Temp:   97.9 F (36.6 C) 98.3 F (36.8 C)  TempSrc:   Oral Oral  SpO2:   100% 97%  Weight:   100.8 kg   Height:        Intake/Output Summary (Last 24 hours) at 03/16/2019 1537 Last data filed at 03/16/2019 1110 Gross per 24 hour  Intake -  Output 1000 ml  Net -1000 ml   Filed Weights   03/14/19 1223 03/16/19 0741 03/16/19 1110  Weight: 104.2 kg 102.7 kg 100.8 kg   General: Alert, Awake and Oriented to Time, Place and Person. Appear in mild distress, affect appropriate Eyes: PERRL, Conjunctiva normal ENT: Oral Mucosa clear moist. Neck: no JVD, no Abnormal Mass Or lumps Cardiovascular: S1 and S2 Present, no Murmur, Peripheral Pulses Present Respiratory: Increased respiratory effort, Bilateral Air entry equal and Decreased, no use of accessory muscle, no Crackles, bilateral  wheezes Abdomen: Bowel Sound present, Soft and no tenderness, no hernia Skin: no redness, no Rash, no induration Extremities: no Pedal edema, no calf tenderness Neurologic: Grossly no focal neuro deficit.  Bilaterally Equal motor strength  Data Reviewed: CBC: Recent Labs  Lab 03/11/19 0641 03/12/19 1400 03/13/19 0445 03/14/19 0510 03/15/19 0500  WBC 6.4 6.3 7.9 8.5 7.6  NEUTROABS 5.0 5.0 6.4 6.6 6.2  HGB 8.6* 9.1* 8.9* 8.4* 8.2*  HCT 25.9* 27.2* 26.9* 25.4* 25.2*  MCV 103.6* 101.5* 103.1* 102.8* 103.3*  PLT 196 280 296 323 563   Basic Metabolic Panel: Recent Labs  Lab 03/12/19 1400 03/13/19 0445 03/14/19 0500 03/15/19 0500 03/16/19 0500  NA 133* 137 136 136 137  K 3.8 3.8 4.0 4.5 4.2  CL 92* 96* 94* 96* 95*  CO2 27 30 26 26 24   GLUCOSE 128* 110* 100* 105* 91  BUN 63* 35* 60* 48* 81*  CREATININE 15.15* 9.40* 11.89* 9.47* 11.71*  CALCIUM 8.6* 8.5* 8.6* 8.4* 9.1  PHOS 6.0*  --  6.2* 5.7* 6.4*    Liver Function Tests: Recent Labs  Lab 03/10/19 0016 03/11/19 0641 03/12/19 1400 03/13/19 0445 03/14/19 0500 03/15/19 0500 03/16/19 0500  AST 31 40 23 25  --   --   --   ALT 20 20 14 18   --   --   --  ALKPHOS 21* 16* 20* 22*  --   --   --   BILITOT 0.9 1.2 0.9 0.7  --   --   --   PROT 8.0 6.7 7.3 7.0  --   --   --   ALBUMIN 3.5 3.0* 2.9* 2.7* 2.6* 2.5* 2.4*   No results for input(s): LIPASE, AMYLASE in the last 168 hours. No results for input(s): AMMONIA in the last 168 hours. Coagulation Profile: Recent Labs  Lab 03/10/19 0016  INR 0.9   Cardiac Enzymes: No results for input(s): CKTOTAL, CKMB, CKMBINDEX, TROPONINI in the last 168 hours. BNP (last 3 results) No results for input(s): PROBNP in the last 8760 hours. CBG: No results for input(s): GLUCAP in the last 168 hours. Studies: Dg Chest Port 1 View  Result Date: 03/16/2019 CLINICAL DATA:  Follow-up pneumonia. + Covid EXAM: PORTABLE CHEST 1 VIEW COMPARISON:  03/10/2019 FINDINGS: There is moderate cardiac enlargement. Decreased lung volumes. Multifocal bilateral pulmonary opacities are identified, right greater than left. When compared with 03/10/2019 there is been interval worsening aeration to the entire  right lung and left lower lobe. IMPRESSION: 1. Increase in bilateral pulmonary opacities compatible with inflammatory/infectious process. Electronically Signed   By: Kerby Moors M.D.   On: 03/16/2019 09:30     Time spent: 35 minutes  Author: Florencia Reasons MD PhD [facility] 03/16/2019 3:37 PM  To reach On-call, see care teams to locate the attending and reach out to them via [email or web address]. If 7PM-7AM, please contact night-coverage If you still have difficulty reaching the attending provider, please page the [facility] (Director on Call) for [facility] on amion for assistance.

## 2019-03-16 NOTE — Plan of Care (Signed)
  Problem: Health Behavior/Discharge Planning: Goal: Ability to manage health-related needs will improve Outcome: Progressing   Problem: Clinical Measurements: Goal: Cardiovascular complication will be avoided Outcome: Progressing   Problem: Nutrition: Goal: Adequate nutrition will be maintained Outcome: Progressing   Problem: Coping: Goal: Level of anxiety will decrease Outcome: Progressing   Problem: Pain Managment: Goal: General experience of comfort will improve Outcome: Progressing

## 2019-03-17 LAB — RENAL FUNCTION PANEL
Albumin: 2.5 g/dL — ABNORMAL LOW (ref 3.5–5.0)
Anion gap: 20 — ABNORMAL HIGH (ref 5–15)
BUN: 57 mg/dL — ABNORMAL HIGH (ref 6–20)
CO2: 25 mmol/L (ref 22–32)
Calcium: 9 mg/dL (ref 8.9–10.3)
Chloride: 90 mmol/L — ABNORMAL LOW (ref 98–111)
Creatinine, Ser: 9.03 mg/dL — ABNORMAL HIGH (ref 0.61–1.24)
GFR calc Af Amer: 7 mL/min — ABNORMAL LOW (ref >60)
GFR calc non Af Amer: 6 mL/min — ABNORMAL LOW (ref >60)
Glucose, Bld: 102 mg/dL — ABNORMAL HIGH (ref 70–99)
Phosphorus: 6.5 mg/dL — ABNORMAL HIGH (ref 2.5–4.6)
Potassium: 4.1 mmol/L (ref 3.5–5.1)
Sodium: 135 mmol/L (ref 135–145)

## 2019-03-17 LAB — FERRITIN: Ferritin: 1376 ng/mL — ABNORMAL HIGH (ref 24–336)

## 2019-03-17 LAB — C-REACTIVE PROTEIN: CRP: 16 mg/dL — ABNORMAL HIGH (ref <1.0)

## 2019-03-17 LAB — LACTATE DEHYDROGENASE: LDH: 292 U/L — ABNORMAL HIGH (ref 98–192)

## 2019-03-17 LAB — D-DIMER, QUANTITATIVE: D-Dimer, Quant: 1.42 ug/mL-FEU — ABNORMAL HIGH (ref 0.00–0.50)

## 2019-03-17 NOTE — Progress Notes (Signed)
Pt states feeling SOB whenever moving in bed.  Assessed patients ability to prone.  Pt stated he has not been proning.  Educated patient on importance of lying on stomach.  Will continue to encourage.

## 2019-03-17 NOTE — Progress Notes (Addendum)
[facility] Progress Note  Subjective: d/w [facility], pt worsening O2 requirement and ^'d scattered infiltrates on CXR 5/29. Took 1 L off on HD yest  Vitals:   03/16/19 1536 03/16/19 1700 03/16/19 2300 03/17/19 0900  BP: 117/79  (!) 138/98 124/88  Pulse: 80  68 65  Resp: 16  18 20   Temp: 98.3 F (36.8 C)  98.7 F (37.1 C) 98.2 F (36.8 C)  TempSrc: Oral  Oral Oral  SpO2: 97% 97% 94% 94%  Weight:      Height:        Inpatient medications: . benzonatate  100 mg Oral TID  . calcitRIOL  1 mcg Oral Q M,W,F-HD  . Chlorhexidine Gluconate Cloth  6 each Topical Q0600  . Chlorhexidine Gluconate Cloth  6 each Topical Q0600  . chlorpheniramine-HYDROcodone  5 mL Oral Q12H  . feeding supplement (NEPRO CARB STEADY)  237 mL Oral BID BM  . guaiFENesin  600 mg Oral BID  . heparin  7,500 Units Subcutaneous Q8H  . heparin  8,000 Units Dialysis Once in dialysis  . heparin  8,000 Units Dialysis Once in dialysis  . methylPREDNISolone (SOLU-MEDROL) injection  40 mg Intravenous Daily  . multivitamin  1 tablet Oral QHS  . sevelamer carbonate  2,400 mg Oral TID WC  . vitamin C  250 mg Oral BID  . zinc sulfate  220 mg Oral Daily    acetaminophen, albuterol, Peppermint Oil, ondansetron **OR** ondansetron (ZOFRAN) IV    Exam:  Patient not examined directly given COVID-19 + status, utilizing exam of the primary team and observations of RN's.   Home meds:  - amlodipine 10/ atenolol 100 hs/ hydralazine 50 hs  - allopurinol 150 qd/ pravastatin 40 hs/ sevelamer carb 3 ac  - humira 40 mg im every 2wk  - bromocriptine 2.5hs/ prn norco/ prn tramadol  - prn's/ vitamins/ supplements   CXR 5/29 > IMPRESSION:  Increase in bilateral pulmonary opacities compatible with inflammatory/infectious process.   MWF HD  4h 61mn  F200  450/800   105.5kg  2/2 bath   Hep 8000  RUA AVF  - mircera q 2w, last 5/20 due 6/3, Hb 10.2  - vit d 1.0 ug tiw   Assessment: 1. COVID+ PNA - per primary.   Getting IV steroids 7-day course started 5/24. Worsening CXR on 5/29 and has had some ^'d O2 requirement per [facility], suspected ^'d lung inflammation 2. ESRD - on HD MWF. HD Monday.  3. HTN/ vol - BP's normal, euvolemic on exam, not eating much and losing body wt here, cont to gently lower edw, down 4kg now took 1L off on HD Friday w/ stable VS 4. Ulcerative colitis - takes Humira every 2wks 5. Anemia ckd - next esa due 6/3, Hb 8- 10 range here (usual 10's) 6. MBD ckd - cont vit d and binders    Rob [facility] 03/17/2019, 10:56 AM  Iron/TIBC/Ferritin/ %Sat    Component Value Date/Time   FERRITIN 1,376 (H) 03/17/2019 0608   Recent Labs  Lab 03/17/19 0608  NA 135  K 4.1  CL 90*  CO2 25  GLUCOSE 102*  BUN 57*  CREATININE 9.03*  CALCIUM 9.0  PHOS 6.5*  ALBUMIN 2.5*   Recent Labs  Lab 03/13/19 0445  AST 25  ALT 18  ALKPHOS 22*  BILITOT 0.7  PROT 7.0   Recent Labs  Lab 03/15/19 0500  WBC 7.6  HGB 8.2*  HCT 25.2*  PLT 338

## 2019-03-17 NOTE — Progress Notes (Signed)
Pt sitting up in bed, states feeling better than this morning, no SOB at rest.  96% on 5L [place].  Decreased oxygen to 4L [place]. O2 sat 94-95% on 4L [place].  Will continue to safely wean oxygen as appropriate.

## 2019-03-17 NOTE — Plan of Care (Signed)
  Problem: Clinical Measurements: continue to attempt to decrease oxygen need and activity tolerance, encourage PO diet  and monitor fluid intake, emotional support given Goal: Respiratory complications will improve Outcome: Progressing Goal: Cardiovascular complication will be avoided Outcome: Progressing   Problem: Nutrition: Goal: Adequate nutrition will be maintained Outcome: Progressing   Problem: Coping: Goal: Level of anxiety will decrease Outcome: Progressing   Problem: Pain Managment: Goal: General experience of comfort will improve Outcome: Progressing

## 2019-03-17 NOTE — Progress Notes (Signed)
[facility] Progress Note  Patient: Colin [name] QPY:[id number]   PCP: Gaynelle Arabian, MD DOB: 1965-10-27   DOA: 03/09/2019   DOS: 03/17/2019   Date of Service: the patient was seen and examined on 03/17/2019  Brief hospital course: Pt. with PMH of ulcerative colitis, end-stage renal disease on hemodialysis, and hypertension; admitted on 03/09/2019, presented with complaint of shortness of breath, was found to have acute COVID  infection. Currently further plan is continue current supportive care.  Subjective:    Patient is on increase o2 at 6liters, he states feeling fine at rest, but remain significant dyspnea with activity,  He reports less cough, tmax 99.2 last 24hrs  No nausea no vomiting.  No abdominal pain.  No diarrhea.     Assessment and Plan: 1. Acute COVID-19 Viral illness, acute hypoxic respiratory failure ( baseline not o2 dependent) Repeat covid testing negative x2 on 5/28 and 5/30  No fever last 24hrs,  Remain significant dyspnea with activity, currently 5 L of oxygen Inflammatory marks seems plateau? Repeat cxr on 5/29 appear worse  Antibiotics:none Diuretics: none on ESRD Vitamin C and Zinc: Ordered DVT Prophylaxis: Subcutaneous Heparin  Dose increased due to worsening marker as well as higher BMI Body mass index is 36.98 kg/m.  Remdesivir: Not a candidate due to ESRD Steroids: IV Solu-Medrol Started on 03/11/2019. Actemra: Currently holding due to ulcerative colitis Prone positioning: Patient encouraged to stay in prone position as much as possible.  During this encounter: Patient Isolation: Droplet + Contact HCP PPE: CAPR, gown. gloves Patient PPE: None   2.  ESRD on HD. Continue HD MWF. Appreciate nephrology consultation.  3.  Ulcerative colitis. Patient reports of abdominal cramps as well as diarrhea this morning. Discussed with Dr. Benson Norway patient's primary gastroenterologist. Julianne Rice is currently on hold.  4.  Anemia of chronic kidney disease.  H&H stable. Monitor.  5.  Essential hypertension. Chronic diastolic CHF on HD Blood pressure soft. Currently holding antihypertensive medication.  Diet: Renal diet DVT Prophylaxis: subcutaneous Heparin  Advance goals of care discussion: Full code  Family Communication: no family was present at bedside, at the time of interview.  Discussed with wife on phone on 03/13/2019  Disposition:  Discharge to home when able to wean down o2, Once able to discharge, if remain positive can go to [facility] If repeat test negative prior to discharge , can go back to regular outpatient dialysis center     Consultants: Nephrology Procedures: Hemodialysis  Scheduled Meds: . benzonatate  100 mg Oral TID  . calcitRIOL  1 mcg Oral Q M,W,F-HD  . Chlorhexidine Gluconate Cloth  6 each Topical Q0600  . Chlorhexidine Gluconate Cloth  6 each Topical Q0600  . chlorpheniramine-HYDROcodone  5 mL Oral Q12H  . feeding supplement (NEPRO CARB STEADY)  237 mL Oral BID BM  . guaiFENesin  600 mg Oral BID  . heparin  7,500 Units Subcutaneous Q8H  . heparin  8,000 Units Dialysis Once in dialysis  . heparin  8,000 Units Dialysis Once in dialysis  . methylPREDNISolone (SOLU-MEDROL) injection  40 mg Intravenous Daily  . multivitamin  1 tablet Oral QHS  . sevelamer carbonate  2,400 mg Oral TID WC  . vitamin C  250 mg Oral BID  . zinc sulfate  220 mg Oral Daily   Continuous Infusions: PRN Meds: acetaminophen, albuterol, Peppermint Oil, ondansetron **OR** ondansetron (ZOFRAN) IV Antibiotics: Anti-infectives (From admission, onward)   None       Objective: Physical Exam: Vitals:  03/16/19 1536 03/16/19 1700 03/16/19 2300 03/17/19 0900  BP: 117/79  (!) 138/98 124/88  Pulse: 80  68 65  Resp: 16  18 20   Temp: 98.3 F (36.8 C)  98.7 F (37.1 C) 98.2 F (36.8 C)  TempSrc: Oral  Oral Oral  SpO2: 97% 97% 94% 94%  Weight:      Height:        Intake/Output Summary (Last 24 hours) at  03/17/2019 1236 Last data filed at 03/17/2019 0900 Gross per 24 hour  Intake 240 ml  Output -  Net 240 ml   Filed Weights   03/14/19 1223 03/16/19 0741 03/16/19 1110  Weight: 104.2 kg 102.7 kg 100.8 kg   General: Alert, Awake and Oriented to Time, Place and Person. Appear in mild distress, affect appropriate Eyes: PERRL, Conjunctiva normal ENT: Oral Mucosa clear moist. Neck: no JVD, no Abnormal Mass Or lumps Cardiovascular: S1 and S2 Present, no Murmur, Peripheral Pulses Present Respiratory: Increased respiratory effort, Bilateral Air entry equal and Decreased, no use of accessory muscle, no Crackles, bilateral  wheezes Abdomen: Bowel Sound present, Soft and no tenderness, no hernia Skin: no redness, no Rash, no induration Extremities: no Pedal edema, no calf tenderness Neurologic: Grossly no focal neuro deficit. Bilaterally Equal motor strength  Data Reviewed: CBC: Recent Labs  Lab 03/11/19 0641 03/12/19 1400 03/13/19 0445 03/14/19 0510 03/15/19 0500  WBC 6.4 6.3 7.9 8.5 7.6  NEUTROABS 5.0 5.0 6.4 6.6 6.2  HGB 8.6* 9.1* 8.9* 8.4* 8.2*  HCT 25.9* 27.2* 26.9* 25.4* 25.2*  MCV 103.6* 101.5* 103.1* 102.8* 103.3*  PLT 196 280 296 323 466   Basic Metabolic Panel: Recent Labs  Lab 03/12/19 1400 03/13/19 0445 03/14/19 0500 03/15/19 0500 03/16/19 0500 03/17/19 0608  NA 133* 137 136 136 137 135  K 3.8 3.8 4.0 4.5 4.2 4.1  CL 92* 96* 94* 96* 95* 90*  CO2 27 30 26 26 24 25   GLUCOSE 128* 110* 100* 105* 91 102*  BUN 63* 35* 60* 48* 81* 57*  CREATININE 15.15* 9.40* 11.89* 9.47* 11.71* 9.03*  CALCIUM 8.6* 8.5* 8.6* 8.4* 9.1 9.0  PHOS 6.0*  --  6.2* 5.7* 6.4* 6.5*    Liver Function Tests: Recent Labs  Lab 03/11/19 0641 03/12/19 1400 03/13/19 0445 03/14/19 0500 03/15/19 0500 03/16/19 0500 03/17/19 0608  AST 40 23 25  --   --   --   --   ALT 20 14 18   --   --   --   --   ALKPHOS 16* 20* 22*  --   --   --   --   BILITOT 1.2 0.9 0.7  --   --   --   --   PROT 6.7 7.3  7.0  --   --   --   --   ALBUMIN 3.0* 2.9* 2.7* 2.6* 2.5* 2.4* 2.5*   No results for input(s): LIPASE, AMYLASE in the last 168 hours. No results for input(s): AMMONIA in the last 168 hours. Coagulation Profile: No results for input(s): INR, PROTIME in the last 168 hours. Cardiac Enzymes: No results for input(s): CKTOTAL, CKMB, CKMBINDEX, TROPONINI in the last 168 hours. BNP (last 3 results) No results for input(s): PROBNP in the last 8760 hours. CBG: No results for input(s): GLUCAP in the last 168 hours. Studies: No results found.   Time spent: 25 minutes  Author: Florencia Reasons MD PhD [facility] 03/17/2019 12:36 PM  To reach On-call, see care teams to locate the attending  and reach out to them via [email or web address]. If 7PM-7AM, please contact night-coverage If you still have difficulty reaching the attending provider, please page the [facility] (Director on Call) for [facility] on amion for assistance.

## 2019-03-17 NOTE — Plan of Care (Signed)
  Problem: Health Behavior/Discharge Planning: Goal: Ability to manage health-related needs will improve Outcome: Progressing   Problem: Clinical Measurements: Goal: Ability to maintain clinical measurements within normal limits will improve Outcome: Progressing   Problem: Clinical Measurements: Goal: Respiratory complications will improve Outcome: Progressing   Problem: Nutrition: Goal: Adequate nutrition will be maintained Outcome: Progressing   Problem: Pain Managment: Goal: General experience of comfort will improve Outcome: Progressing

## 2019-03-18 LAB — RENAL FUNCTION PANEL
Albumin: 2.5 g/dL — ABNORMAL LOW (ref 3.5–5.0)
Anion gap: 18 — ABNORMAL HIGH (ref 5–15)
BUN: 89 mg/dL — ABNORMAL HIGH (ref 6–20)
CO2: 23 mmol/L (ref 22–32)
Calcium: 9.2 mg/dL (ref 8.9–10.3)
Chloride: 94 mmol/L — ABNORMAL LOW (ref 98–111)
Creatinine, Ser: 11.93 mg/dL — ABNORMAL HIGH (ref 0.61–1.24)
GFR calc Af Amer: 5 mL/min — ABNORMAL LOW (ref >60)
GFR calc non Af Amer: 4 mL/min — ABNORMAL LOW (ref >60)
Glucose, Bld: 98 mg/dL (ref 70–99)
Phosphorus: 7.4 mg/dL — ABNORMAL HIGH (ref 2.5–4.6)
Potassium: 4.3 mmol/L (ref 3.5–5.1)
Sodium: 135 mmol/L (ref 135–145)

## 2019-03-18 LAB — C-REACTIVE PROTEIN: CRP: 10.6 mg/dL — ABNORMAL HIGH (ref <1.0)

## 2019-03-18 LAB — FERRITIN: Ferritin: 1204 ng/mL — ABNORMAL HIGH (ref 24–336)

## 2019-03-18 LAB — LACTATE DEHYDROGENASE: LDH: 325 U/L — ABNORMAL HIGH (ref 98–192)

## 2019-03-18 LAB — D-DIMER, QUANTITATIVE: D-Dimer, Quant: 1.43 ug/mL-FEU — ABNORMAL HIGH (ref 0.00–0.50)

## 2019-03-18 MED ORDER — CHLORHEXIDINE GLUCONATE CLOTH 2 % EX PADS
6.0000 | MEDICATED_PAD | Freq: Every day | CUTANEOUS | Status: DC
  Administered 2019-03-18 – 2019-03-20 (×2): 6 via TOPICAL

## 2019-03-18 NOTE — Progress Notes (Signed)
Pt comfortable and breathing unlabiored. Oxygen at 98% 2L[place].  Decreased pt's o2 to 1L [place].  Pt oxygen sat 97% on 1 LNC. Will continue to monitor.

## 2019-03-18 NOTE — Progress Notes (Signed)
[facility] Progress Note  Subjective: d/w [facility] MD, pt worsening O2 requirement and ^'d scattered infiltrates on CXR 5/29. Took 1 L off on HD yest  Vitals:   03/17/19 1600 03/17/19 2100 03/18/19 0800 03/18/19 0900  BP: 129/87 (!) 128/96 (!) 128/96   Pulse: 73 66 64   Resp: 18 18    Temp: 98.3 F (36.8 C) 98.5 F (36.9 C) 98.1 F (36.7 C)   TempSrc: Oral Oral Oral   SpO2: 94% 96% 95% 95%  Weight:      Height:        Inpatient medications: . benzonatate  100 mg Oral TID  . calcitRIOL  1 mcg Oral Q M,W,F-HD  . Chlorhexidine Gluconate Cloth  6 each Topical Q0600  . Chlorhexidine Gluconate Cloth  6 each Topical Q0600  . chlorpheniramine-HYDROcodone  5 mL Oral Q12H  . feeding supplement (NEPRO CARB STEADY)  237 mL Oral BID BM  . guaiFENesin  600 mg Oral BID  . heparin  7,500 Units Subcutaneous Q8H  . heparin  8,000 Units Dialysis Once in dialysis  . heparin  8,000 Units Dialysis Once in dialysis  . methylPREDNISolone (SOLU-MEDROL) injection  40 mg Intravenous Daily  . multivitamin  1 tablet Oral QHS  . sevelamer carbonate  2,400 mg Oral TID WC  . vitamin C  250 mg Oral BID  . zinc sulfate  220 mg Oral Daily    acetaminophen, albuterol, Peppermint Oil, ondansetron **OR** ondansetron (ZOFRAN) IV    Exam:  Patient not examined directly given COVID-19 + status, utilizing exam of the primary team and observations of RN's.   Home meds:  - amlodipine 10/ atenolol 100 hs/ hydralazine 50 hs  - allopurinol 150 qd/ pravastatin 40 hs/ sevelamer carb 3 ac  - humira 40 mg im every 2wk  - bromocriptine 2.5hs/ prn norco/ prn tramadol  - prn's/ vitamins/ supplements   CXR 5/29 > IMPRESSION:  Increase in bilateral pulmonary opacities compatible with inflammatory/infectious process.   MWF HD  4h 74mn  F200  450/800   105.5kg  2/2 bath   Hep 8000  RUA AVF  - mircera q 2w, last 5/20 due 6/3, Hb 10.2  - vit d 1.0 ug tiw   Assessment: 1. COVID+ PNA - per primary.   Getting IV steroids 40 qd. Worsening CXR on 5/29 and has had some ^'d O2 requirement per [facility], suspected ^'d lung inflammation. Remains stable on 2L [place]. DOE is main c/o, not at rest 2. ESRD - on HD MWF. HD Monday. We are running shorter times due to staffing issues, follow labs.  3. HTN/ vol - BP's normal, 4-5kg under dry wt. 0- 1 UF goal w/ HD. Not eating much 4. H/o ulcerative colitis - takes Humira every 2wks 5. Anemia ckd - next esa due 6/3, Hb 8- 10 range here (usual 10's) 6. MBD ckd - cont vit d and binders    [facility] 03/18/2019, 10:55 AM  Iron/TIBC/Ferritin/ %Sat    Component Value Date/Time   FERRITIN 1,376 (H) 03/17/2019 0608   Recent Labs  Lab 03/17/19 0608  NA 135  K 4.1  CL 90*  CO2 25  GLUCOSE 102*  BUN 57*  CREATININE 9.03*  CALCIUM 9.0  PHOS 6.5*  ALBUMIN 2.5*   Recent Labs  Lab 03/13/19 0445  AST 25  ALT 18  ALKPHOS 22*  BILITOT 0.7  PROT 7.0   Recent Labs  Lab 03/15/19 0500  WBC 7.6  HGB 8.2*  HCT 25.2*  PLT 338

## 2019-03-18 NOTE — Plan of Care (Signed)
  Problem: Clinical Measurements: Goal: Ability to maintain clinical measurements within normal limits will improve Outcome: Progressing Goal: Respiratory complications will improve Outcome: Progressing   

## 2019-03-18 NOTE — Progress Notes (Signed)
[facility] Progress Note  Patient: Colin [name] [id number]:[id number]   PCP: Gaynelle Arabian, MD DOB: 09-27-1966   DOA: 03/09/2019   DOS: 03/18/2019   Date of Service: the patient was seen and examined on 03/18/2019  Brief hospital course: Pt. with PMH of ulcerative colitis, end-stage renal disease on hemodialysis, and hypertension; admitted on 03/09/2019, presented with complaint of shortness of breath, was found to have acute COVID  infection. Currently further plan is continue current supportive care.  Subjective:    Today he started to feel better with less oxygen requirement and less dyspnea No fever last 24hrs   Assessment and Plan: 1. Acute COVID-19 Viral illness, acute hypoxic respiratory failure ( baseline not o2 dependent) Repeat covid testing negative x2 on 5/28 and 5/30 Repeat cxr on 5/29 appear worse , but Inflammatory marks seems plateau and appear to trend down, clinically he start to improve started from 5/31 No fever last 24hrs,  Less dyspnea with activity with decreased oxygen requirement    Antibiotics:none Diuretics: none on ESRD Vitamin C and Zinc: Ordered DVT Prophylaxis: Subcutaneous Heparin  Dose increased due to worsening marker as well as higher BMI Body mass index is 36.98 kg/m.  Remdesivir: Not a candidate due to ESRD Steroids: IV Solu-Medrol Started on 03/11/2019. Can stop steroids if he continue to improve Actemra: Currently holding due to ulcerative colitis Prone positioning: Patient encouraged to stay in prone position as much as possible.  During this encounter: Patient Isolation: Droplet + Contact HCP PPE: CAPR, gown. gloves Patient PPE: None   2.  ESRD on HD. Continue HD MWF. Appreciate nephrology consultation.  3.  Ulcerative colitis. Patient reports of abdominal cramps as well as diarrhea this morning. Discussed with Dr. Benson Norway patient's primary gastroenterologist. Julianne Rice is currently on hold.  4.  Anemia of chronic kidney disease. H&H  stable. Monitor.  5.  Essential hypertension. Chronic diastolic CHF on HD Blood pressure soft. Currently holding antihypertensive medication.  Diet: Renal diet DVT Prophylaxis: subcutaneous Heparin  Advance goals of care discussion: Full code  Family Communication: no family was present at bedside, at the time of interview.  Discussed with wife on phone on 03/13/2019  Disposition:  Discharge to home in 1-2 days, likely will need discharge on home 02 Once able to discharge, if remain positive can go to [facility]      Consultants: Nephrology Procedures: Hemodialysis  Scheduled Meds: . benzonatate  100 mg Oral TID  . calcitRIOL  1 mcg Oral Q M,W,F-HD  . Chlorhexidine Gluconate Cloth  6 each Topical Q0600  . Chlorhexidine Gluconate Cloth  6 each Topical Q0600  . Chlorhexidine Gluconate Cloth  6 each Topical Q0600  . chlorpheniramine-HYDROcodone  5 mL Oral Q12H  . feeding supplement (NEPRO CARB STEADY)  237 mL Oral BID BM  . guaiFENesin  600 mg Oral BID  . heparin  7,500 Units Subcutaneous Q8H  . heparin  8,000 Units Dialysis Once in dialysis  . heparin  8,000 Units Dialysis Once in dialysis  . methylPREDNISolone (SOLU-MEDROL) injection  40 mg Intravenous Daily  . multivitamin  1 tablet Oral QHS  . sevelamer carbonate  2,400 mg Oral TID WC  . vitamin C  250 mg Oral BID  . zinc sulfate  220 mg Oral Daily   Continuous Infusions: PRN Meds: acetaminophen, albuterol, Peppermint Oil, ondansetron **OR** ondansetron (ZOFRAN) IV Antibiotics: Anti-infectives (From admission, onward)   None       Objective: Physical Exam: Vitals:   03/18/19 0800 03/18/19 0900  03/18/19 1300 03/18/19 1610  BP: (!) 128/96   130/89  Pulse: 64   82  Resp:    19  Temp: 98.1 F (36.7 C)   98 F (36.7 C)  TempSrc: Oral   Oral  SpO2: 95% 95% 98% 96%  Weight:      Height:        Intake/Output Summary (Last 24 hours) at 03/18/2019 1905 Last data filed at 03/18/2019 1600 Gross  per 24 hour  Intake 684 ml  Output 300 ml  Net 384 ml   Filed Weights   03/14/19 1223 03/16/19 0741 03/16/19 1110  Weight: 104.2 kg 102.7 kg 100.8 kg   General: Alert, Awake and Oriented to Time, Place and Person. Appear much better today, not in distress, pleasant Eyes: PERRL, Conjunctiva normal ENT: Oral Mucosa clear moist. Neck: no JVD, no Abnormal Mass Or lumps Cardiovascular: S1 and S2 Present, no Murmur, Peripheral Pulses Present Respiratory: improved,  no use of accessory muscle, no Crackles, bilateral  wheezes Abdomen: Bowel Sound present, Soft and no tenderness, no hernia Skin: no redness, no Rash, no induration Extremities: no Pedal edema, no calf tenderness Neurologic: Grossly no focal neuro deficit. Bilaterally Equal motor strength  Data Reviewed: CBC: Recent Labs  Lab 03/12/19 1400 03/13/19 0445 03/14/19 0510 03/15/19 0500  WBC 6.3 7.9 8.5 7.6  NEUTROABS 5.0 6.4 6.6 6.2  HGB 9.1* 8.9* 8.4* 8.2*  HCT 27.2* 26.9* 25.4* 25.2*  MCV 101.5* 103.1* 102.8* 103.3*  PLT 280 296 323 102   Basic Metabolic Panel: Recent Labs  Lab 03/14/19 0500 03/15/19 0500 03/16/19 0500 03/17/19 0608 03/18/19 1001  NA 136 136 137 135 135  K 4.0 4.5 4.2 4.1 4.3  CL 94* 96* 95* 90* 94*  CO2 26 26 24 25 23   GLUCOSE 100* 105* 91 102* 98  BUN 60* 48* 81* 57* 89*  CREATININE 11.89* 9.47* 11.71* 9.03* 11.93*  CALCIUM 8.6* 8.4* 9.1 9.0 9.2  PHOS 6.2* 5.7* 6.4* 6.5* 7.4*    Liver Function Tests: Recent Labs  Lab 03/12/19 1400 03/13/19 0445 03/14/19 0500 03/15/19 0500 03/16/19 0500 03/17/19 0608 03/18/19 1001  AST 23 25  --   --   --   --   --   ALT 14 18  --   --   --   --   --   ALKPHOS 20* 22*  --   --   --   --   --   BILITOT 0.9 0.7  --   --   --   --   --   PROT 7.3 7.0  --   --   --   --   --   ALBUMIN 2.9* 2.7* 2.6* 2.5* 2.4* 2.5* 2.5*   No results for input(s): LIPASE, AMYLASE in the last 168 hours. No results for input(s): AMMONIA in the last 168 hours.  Coagulation Profile: No results for input(s): INR, PROTIME in the last 168 hours. Cardiac Enzymes: No results for input(s): CKTOTAL, CKMB, CKMBINDEX, TROPONINI in the last 168 hours. BNP (last 3 results) No results for input(s): PROBNP in the last 8760 hours. CBG: No results for input(s): GLUCAP in the last 168 hours. Studies: No results found.   Time spent: 25 minutes  Author: Florencia Reasons MD PhD [facility] 03/18/2019 7:05 PM  To reach On-call, see care teams to locate the attending and reach out to them via [email or web address]. If 7PM-7AM, please contact night-coverage If you still have difficulty reaching the attending provider, please  page the [facility] (Director on Call) for [facility] on amion for assistance.

## 2019-03-19 LAB — CBC
HCT: 24.9 % — ABNORMAL LOW (ref 39.0–52.0)
Hemoglobin: 8.2 g/dL — ABNORMAL LOW (ref 13.0–17.0)
MCH: 33.3 pg (ref 26.0–34.0)
MCHC: 32.9 g/dL (ref 30.0–36.0)
MCV: 101.2 fL — ABNORMAL HIGH (ref 80.0–100.0)
Platelets: 514 10*3/uL — ABNORMAL HIGH (ref 150–400)
RBC: 2.46 MIL/uL — ABNORMAL LOW (ref 4.22–5.81)
RDW: 14.4 % (ref 11.5–15.5)
WBC: 9.5 10*3/uL (ref 4.0–10.5)
nRBC: 0.4 % — ABNORMAL HIGH (ref 0.0–0.2)

## 2019-03-19 LAB — RENAL FUNCTION PANEL
Albumin: 2.5 g/dL — ABNORMAL LOW (ref 3.5–5.0)
Anion gap: 19 — ABNORMAL HIGH (ref 5–15)
BUN: 105 mg/dL — ABNORMAL HIGH (ref 6–20)
CO2: 20 mmol/L — ABNORMAL LOW (ref 22–32)
Calcium: 8.8 mg/dL — ABNORMAL LOW (ref 8.9–10.3)
Chloride: 95 mmol/L — ABNORMAL LOW (ref 98–111)
Creatinine, Ser: 13.73 mg/dL — ABNORMAL HIGH (ref 0.61–1.24)
GFR calc Af Amer: 4 mL/min — ABNORMAL LOW (ref >60)
GFR calc non Af Amer: 4 mL/min — ABNORMAL LOW (ref >60)
Glucose, Bld: 108 mg/dL — ABNORMAL HIGH (ref 70–99)
Phosphorus: 7.6 mg/dL — ABNORMAL HIGH (ref 2.5–4.6)
Potassium: 4.7 mmol/L (ref 3.5–5.1)
Sodium: 134 mmol/L — ABNORMAL LOW (ref 135–145)

## 2019-03-19 LAB — D-DIMER, QUANTITATIVE: D-Dimer, Quant: 1.83 ug/mL-FEU — ABNORMAL HIGH (ref 0.00–0.50)

## 2019-03-19 LAB — LACTATE DEHYDROGENASE: LDH: 238 U/L — ABNORMAL HIGH (ref 98–192)

## 2019-03-19 LAB — FERRITIN: Ferritin: 1246 ng/mL — ABNORMAL HIGH (ref 24–336)

## 2019-03-19 LAB — C-REACTIVE PROTEIN: CRP: 11.5 mg/dL — ABNORMAL HIGH (ref <1.0)

## 2019-03-19 MED ORDER — NEPRO/CARBSTEADY PO LIQD: 237.0000 mL | ORAL | Status: DC

## 2019-03-19 MED ORDER — HEPARIN SODIUM (PORCINE) 1000 UNIT/ML IJ SOLN
INTRAMUSCULAR | Status: AC
  Filled 2019-03-19: qty 8

## 2019-03-19 MED ORDER — PRO-STAT SUGAR FREE PO LIQD
30.0000 mL | Freq: Two times a day (BID) | ORAL | Status: DC
  Administered 2019-03-19 (×2): 30 mL via ORAL
  Filled 2019-03-19 (×2): qty 30

## 2019-03-19 MED ORDER — HEPARIN SODIUM (PORCINE) 1000 UNIT/ML DIALYSIS
8000.0000 [IU] | Freq: Once | INTRAMUSCULAR | Status: DC
  Filled 2019-03-19: qty 8

## 2019-03-19 NOTE — Progress Notes (Addendum)
[facility] Progress Note  Patient: Colin [name] VEH:[id number]   PCP: Gaynelle Arabian, MD DOB: [birth year]   DOA: 03/09/2019   DOS: 03/19/2019   Date of Service: the patient was seen and examined on 03/19/2019  Brief hospital course: Pt. with PMH of ulcerative colitis, end-stage renal disease on hemodialysis, and hypertension; admitted on 03/09/2019, presented with complaint of shortness of breath, was found to have acute COVID  infection. -Noted to have mild hypoxemia, clinically improving with supportive care, was given IV steroids and supplemental O2, proning  Subjective:   -Feels better overall, dyspneic with activity, anxious and does not feel ready to go home today, -Wants to ambulate post dialysis and assess his symptoms  Assessment and Plan: 1. Acute COVID-19 infection/Acute hypoxic respiratory failure -Chest x-ray on admission showed hazy bilateral peripheral opacities -Treated with IV steroids initially, now off -Not a candidate for remdesivir due to ESRD -Actemra not given earlier in this hospital stay due to history of ulcerative colitis and clinical improvement -Clinically improving, inflammatory markers namely CRP, LDH, ferritin trending down as well -Required as much as 6 L earlier this hospital stay, now down to 1 L -Check ambulatory O2 sats after HD -Discharge planning -Encouraged prone position -Higher dose heparin for DVT prophylaxis  2.  ESRD on HD. Continue HD MWF, HD today Appreciate nephrology consultation. -Last COVID-19 PCR was positive from 5/23  3.  Ulcerative colitis. -had cramps and diarrhea this weekend  -Appears to have improved, continue supportive care  -Humira on hold, followed by GI Dr. Benson Norway   4.  Anemia of chronic kidney disease. -Stable, continue EPO and iron with HD  5.  Essential hypertension. Chronic diastolic CHF on HD -antiHypertensives held  Diet: Renal diet DVT Prophylaxis: subcutaneous Heparin  Advance goals of care  discussion: Full code  Family Communication: no family was present at bedside,  Disposition: home tomorrow if stable     Consultants: Nephrology Procedures: Hemodialysis  Scheduled Meds: . benzonatate  100 mg Oral TID  . calcitRIOL  1 mcg Oral Q M,W,F-HD  . Chlorhexidine Gluconate Cloth  6 each Topical Q0600  . chlorpheniramine-HYDROcodone  5 mL Oral Q12H  . feeding supplement (NEPRO CARB STEADY)  237 mL Oral Q24H  . feeding supplement (PRO-STAT SUGAR FREE 64)  30 mL Oral BID  . guaiFENesin  600 mg Oral BID  . heparin  7,500 Units Subcutaneous Q8H  . heparin  8,000 Units Dialysis Once in dialysis  . [START ON 03/20/2019] heparin  8,000 Units Dialysis Once in dialysis  . multivitamin  1 tablet Oral QHS  . sevelamer carbonate  2,400 mg Oral TID WC  . vitamin C  250 mg Oral BID  . zinc sulfate  220 mg Oral Daily   Continuous Infusions: PRN Meds: acetaminophen, albuterol, Peppermint Oil, ondansetron **OR** ondansetron (ZOFRAN) IV Antibiotics: Anti-infectives (From admission, onward)   None       Objective: Physical Exam: Vitals:   03/19/19 1015 03/19/19 1030 03/19/19 1045 03/19/19 1110  BP: 114/82 123/77 117/82 (!) 125/91  Pulse: 85 86 83 82  Resp:    18  Temp:    98.5 F (36.9 C)  TempSrc:    Oral  SpO2:    97%  Weight:    100.6 kg  Height:        Intake/Output Summary (Last 24 hours) at 03/19/2019 1613 Last data filed at 03/19/2019 1500 Gross per 24 hour  Intake 600 ml  Output 1400 ml  Net -800 ml  Filed Weights   03/19/19 0600 03/19/19 0731 03/19/19 1110  Weight: 101.6 kg 101.6 kg 100.6 kg   Gen: Awake, Alert, Oriented X 3, no distress, obese laying in bed HEENT: PERRLA, Neck supple, no JVD Lungs: Decreased breath sounds at both bases, otherwise clear CVS: RRR,No Gallops,Rubs or new Murmurs Abd: soft, Non tender, non distended, BS present Extremities: No edema, right arm AV fistula Skin: no new rashes Neurologic: Grossly no focal neuro deficit.  Bilaterally Equal motor strength  Data Reviewed: CBC: Recent Labs  Lab 03/13/19 0445 03/14/19 0510 03/15/19 0500 03/19/19 0800  WBC 7.9 8.5 7.6 9.5  NEUTROABS 6.4 6.6 6.2  --   HGB 8.9* 8.4* 8.2* 8.2*  HCT 26.9* 25.4* 25.2* 24.9*  MCV 103.1* 102.8* 103.3* 101.2*  PLT 296 323 338 680*   Basic Metabolic Panel: Recent Labs  Lab 03/15/19 0500 03/16/19 0500 03/17/19 0608 03/18/19 1001 03/19/19 0500  NA 136 137 135 135 134*  K 4.5 4.2 4.1 4.3 4.7  CL 96* 95* 90* 94* 95*  CO2 26 24 25 23  20*  GLUCOSE 105* 91 102* 98 108*  BUN 48* 81* 57* 89* 105*  CREATININE 9.47* 11.71* 9.03* 11.93* 13.73*  CALCIUM 8.4* 9.1 9.0 9.2 8.8*  PHOS 5.7* 6.4* 6.5* 7.4* 7.6*    Liver Function Tests: Recent Labs  Lab 03/13/19 0445  03/15/19 0500 03/16/19 0500 03/17/19 0608 03/18/19 1001 03/19/19 0500  AST 25  --   --   --   --   --   --   ALT 18  --   --   --   --   --   --   ALKPHOS 22*  --   --   --   --   --   --   BILITOT 0.7  --   --   --   --   --   --   PROT 7.0  --   --   --   --   --   --   ALBUMIN 2.7*   < > 2.5* 2.4* 2.5* 2.5* 2.5*   < > = values in this interval not displayed.   No results for input(s): LIPASE, AMYLASE in the last 168 hours. No results for input(s): AMMONIA in the last 168 hours. Coagulation Profile: No results for input(s): INR, PROTIME in the last 168 hours. Cardiac Enzymes: No results for input(s): CKTOTAL, CKMB, CKMBINDEX, TROPONINI in the last 168 hours. BNP (last 3 results) No results for input(s): PROBNP in the last 8760 hours. CBG: No results for input(s): GLUCAP in the last 168 hours. Studies: No results found.   Time spent: 35 minutes  Author: Domenic Polite, MD  03/19/2019 4:13 PM

## 2019-03-19 NOTE — Progress Notes (Signed)
Patient ID: Colin [name], male   DOB: 1966/09/27, 53 y.o.   MRN: [id number]  [facility] Progress Note   Assessment/ Plan:   1.  COVID-19 pneumonia: With clinical improvement overnight and decreasing oxygen requirements and currently ongoing assessment for oxygen saturation off of supplementation. 2. ESRD: Continue hemodialysis on a Monday/Wednesday/Friday schedule with efforts at maintaining current EDW. 3. Anemia: Hemoglobin low but stable, no overt loss.  Monitor on ESA. 4. CKD-MBD: Significant hyperphosphatemia noted, continue renal diet and phosphorus binders. 5. Nutrition: Continue renal diet with oral nutritional supplementation. 6. Hypertension: Blood pressures acceptable, monitor with UF/HD.  Subjective:   Discussed with Dr. Broadus John of [facility] who saw the patient earlier, he is doing better from an oxygenation standpoint but is apprehensive to go home.  He is currently off of oxygen supplementation and will get repeat SPO2 after dialysis.  I spoke to the RN performing his dialysis at bedside via phone-he is doing well with stable blood pressures and tolerating prescribed UF.   Objective:   BP 111/75   Pulse 71   Temp 97.9 F (36.6 C) (Oral)   Resp 18   Ht 5' 5"  (1.651 m)   Wt 101.6 kg   SpO2 96%   BMI 37.27 kg/m   Physical Exam: Patient not examined-COVID-19 caveat. Pertinent/relevant findings reviewed with hemodialysis RN and Dr. Broadus John of [facility].  Labs: BMET Recent Labs  Lab 03/12/19 1400 03/13/19 0445 03/14/19 0500 03/15/19 0500 03/16/19 0500 03/17/19 0608 03/18/19 1001  NA 133* 137 136 136 137 135 135  K 3.8 3.8 4.0 4.5 4.2 4.1 4.3  CL 92* 96* 94* 96* 95* 90* 94*  CO2 27 30 26 26 24 25 23   GLUCOSE 128* 110* 100* 105* 91 102* 98  BUN 63* 35* 60* 48* 81* 57* 89*  CREATININE 15.15* 9.40* 11.89* 9.47* 11.71* 9.03* 11.93*  CALCIUM 8.6* 8.5* 8.6* 8.4* 9.1 9.0 9.2  PHOS 6.0*  --  6.2* 5.7* 6.4* 6.5* 7.4*   CBC Recent Labs  Lab  03/12/19 1400 03/13/19 0445 03/14/19 0510 03/15/19 0500 03/19/19 0800  WBC 6.3 7.9 8.5 7.6 9.5  NEUTROABS 5.0 6.4 6.6 6.2  --   HGB 9.1* 8.9* 8.4* 8.2* 8.2*  HCT 27.2* 26.9* 25.4* 25.2* 24.9*  MCV 101.5* 103.1* 102.8* 103.3* 101.2*  PLT 280 296 323 338 514*   Medications:    . benzonatate  100 mg Oral TID  . calcitRIOL  1 mcg Oral Q M,W,F-HD  . Chlorhexidine Gluconate Cloth  6 each Topical Q0600  . Chlorhexidine Gluconate Cloth  6 each Topical Q0600  . Chlorhexidine Gluconate Cloth  6 each Topical Q0600  . chlorpheniramine-HYDROcodone  5 mL Oral Q12H  . feeding supplement (NEPRO CARB STEADY)  237 mL Oral BID BM  . guaiFENesin  600 mg Oral BID  . heparin  7,500 Units Subcutaneous Q8H  . heparin  8,000 Units Dialysis Once in dialysis  . [START ON 03/20/2019] heparin  8,000 Units Dialysis Once in dialysis  . multivitamin  1 tablet Oral QHS  . sevelamer carbonate  2,400 mg Oral TID WC  . vitamin C  250 mg Oral BID  . zinc sulfate  220 mg Oral Daily   Elmarie Shiley, MD 03/19/2019, 10:02 AM

## 2019-03-19 NOTE — Progress Notes (Signed)
Nutrition Follow-up  DOCUMENTATION CODES:   Obesity unspecified  INTERVENTION:   - Decrease Nepro Shake po daily, each supplement provides 425 kcal and 19 grams protein -Provide Prostat liquid protein PO 30 ml BID with meals, each supplement provides 100 kcal, 15 grams protein. -Continue Rena-vit daily  NUTRITION DIAGNOSIS:   Increased nutrient needs related to chronic illness(ESRD on HD) as evidenced by estimated needs.  Ongoing.  GOAL:   Patient will meet greater than or equal to 90% of their needs  Not meeting.  MONITOR:   PO intake, Supplement acceptance, Labs, Weight trends, I & O's  ASSESSMENT:   Pt. with PMH of ulcerative colitis, end-stage renal disease on hemodialysis, and hypertension; admitted on 03/09/2019, presented with complaint of shortness of breath, was found to have acute COVID  infection.  **RD working remotely**  Per chart review, pt did not eat well over the weekend. Mainly consumed water, ginger ale and other liquids. Only accepted 1 Nepro supplement on 5/30.  Receiving HD this morning.   Per weight records, weights are trending down. Post-dialysis weight on 5/29 was 221 lbs. This is lower than EDW. Per I/O's: +1.5L since admit.  UOP x 24 hours: 700 ml Medications: Rena-Vit daily, Vitamin C tablet BID, Zinc sulfate capsule (day 7), IV Zofran PRN Labs reviewed: Elevated Phos : 7.4 GFR: 5  Diet Order:   Diet Order            Diet renal with fluid restriction Fluid restriction: 1200 mL Fluid; Room service appropriate? Yes; Fluid consistency: Thin  Diet effective now              EDUCATION NEEDS:   No education needs have been identified at this time  Skin:  Skin Assessment: Reviewed RN Assessment  Last BM:  5/28  Height:   Ht Readings from Last 1 Encounters:  03/09/19 5' 5"  (1.651 m)    Weight:   Wt Readings from Last 1 Encounters:  03/19/19 101.6 kg    Ideal Body Weight:  61.8 kg  BMI:  Body mass index is 37.27  kg/m.  Estimated Nutritional Needs:   Kcal:  7062-3762  Protein:  110-120g  Fluid:  1L + UOP  Clayton Bibles, MS, RD, LDN [facility] Pager: [phone] After Hours Pager: [phone]

## 2019-03-20 ENCOUNTER — Encounter (HOSPITAL_COMMUNITY): Payer: Self-pay

## 2019-03-20 LAB — RENAL FUNCTION PANEL
Albumin: 2.6 g/dL — ABNORMAL LOW (ref 3.5–5.0)
Anion gap: 15 (ref 5–15)
BUN: 69 mg/dL — ABNORMAL HIGH (ref 6–20)
CO2: 25 mmol/L (ref 22–32)
Calcium: 8.8 mg/dL — ABNORMAL LOW (ref 8.9–10.3)
Chloride: 96 mmol/L — ABNORMAL LOW (ref 98–111)
Creatinine, Ser: 10.32 mg/dL — ABNORMAL HIGH (ref 0.61–1.24)
GFR calc Af Amer: 6 mL/min — ABNORMAL LOW (ref >60)
GFR calc non Af Amer: 5 mL/min — ABNORMAL LOW (ref >60)
Glucose, Bld: 103 mg/dL — ABNORMAL HIGH (ref 70–99)
Phosphorus: 6.5 mg/dL — ABNORMAL HIGH (ref 2.5–4.6)
Potassium: 4.1 mmol/L (ref 3.5–5.1)
Sodium: 136 mmol/L (ref 135–145)

## 2019-03-20 LAB — MRSA PCR SCREENING: MRSA by PCR: NEGATIVE

## 2019-03-20 LAB — D-DIMER, QUANTITATIVE: D-Dimer, Quant: 1.89 ug/mL-FEU — ABNORMAL HIGH (ref 0.00–0.50)

## 2019-03-20 LAB — FERRITIN: Ferritin: 911 ng/mL — ABNORMAL HIGH (ref 24–336)

## 2019-03-20 LAB — LACTATE DEHYDROGENASE: LDH: 218 U/L — ABNORMAL HIGH (ref 98–192)

## 2019-03-20 LAB — SARS CORONAVIRUS 2 BY RT PCR (HOSPITAL ORDER, PERFORMED IN ~~LOC~~ HOSPITAL LAB): SARS Coronavirus 2: POSITIVE — AB

## 2019-03-20 LAB — C-REACTIVE PROTEIN: CRP: 11.9 mg/dL — ABNORMAL HIGH (ref <1.0)

## 2019-03-20 NOTE — Progress Notes (Signed)
Patient ID: Colin [name], male   DOB: [birth year], 53 y.o.   MRN: [id number]  [facility] Progress Note   Assessment/ Plan:   1.  COVID-19 pneumonia: Continues to show clinical improvement and now with no additional need for oxygen supplementation allowing discharge home) may need oxygen during dialysis for comfort). 2. ESRD: Continue hemodialysis on a Monday/Wednesday/Friday schedule with efforts at maintaining current EDW, he will transiently get hemodialysis at the [facility] unit with dedicated COVID-19 shift. 3. Anemia: Hemoglobin low but stable, no overt loss.  Monitor on ESA. 4. CKD-MBD: Hyperphosphatemia noted, continue renal diet and phosphorus binders. 5. Nutrition: Continue renal diet with oral nutritional supplementation. 6. Hypertension: Blood pressures acceptable, monitor with UF/HD.  Subjective:   Discussed with Dr. Broadus John of [facility] who examined and discussed with the patient earlier, Colin [name] continues to do better off of oxygen supplementation.  The plan is to discharge him to home today after COVID-19 testing to resume hemodialysis at the [facility] dialysis unit (MWF third shift schedule) until he has his second negative test that would allow him to go back to his usual outpatient dialysis unit.   Objective:   BP 122/87 (BP Location: Left Arm)   Pulse 86   Temp 98.4 F (36.9 C) (Oral)   Resp 16   Ht 5' 5"  (1.651 m)   Wt 100.6 kg   SpO2 94%   BMI 36.91 kg/m   Physical Exam: Patient not examined-COVID-19 caveat. Pertinent/relevant findings reviewed with hemodialysis RN and Dr. Broadus John of [facility].  Labs: BMET Recent Labs  Lab 03/14/19 0500 03/15/19 0500 03/16/19 0500 03/17/19 0608 03/18/19 1001 03/19/19 0500 03/20/19 0939  NA 136 136 137 135 135 134* 136  K 4.0 4.5 4.2 4.1 4.3 4.7 4.1  CL 94* 96* 95* 90* 94* 95* 96*  CO2 26 26 24 25 23  20* 25  GLUCOSE 100* 105* 91 102* 98 108* 103*  BUN 60* 48* 81* 57* 89* 105* 69*   CREATININE 11.89* 9.47* 11.71* 9.03* 11.93* 13.73* 10.32*  CALCIUM 8.6* 8.4* 9.1 9.0 9.2 8.8* 8.8*  PHOS 6.2* 5.7* 6.4* 6.5* 7.4* 7.6* 6.5*   CBC Recent Labs  Lab 03/14/19 0510 03/15/19 0500 03/19/19 0800  WBC 8.5 7.6 9.5  NEUTROABS 6.6 6.2  --   HGB 8.4* 8.2* 8.2*  HCT 25.4* 25.2* 24.9*  MCV 102.8* 103.3* 101.2*  PLT 323 338 514*   Medications:    . benzonatate  100 mg Oral TID  . calcitRIOL  1 mcg Oral Q M,W,F-HD  . Chlorhexidine Gluconate Cloth  6 each Topical Q0600  . chlorpheniramine-HYDROcodone  5 mL Oral Q12H  . feeding supplement (NEPRO CARB STEADY)  237 mL Oral Q24H  . feeding supplement (PRO-STAT SUGAR FREE 64)  30 mL Oral BID  . guaiFENesin  600 mg Oral BID  . heparin  7,500 Units Subcutaneous Q8H  . heparin  8,000 Units Dialysis Once in dialysis  . heparin  8,000 Units Dialysis Once in dialysis  . multivitamin  1 tablet Oral QHS  . sevelamer carbonate  2,400 mg Oral TID WC  . vitamin C  250 mg Oral BID  . zinc sulfate  220 mg Oral Daily   Elmarie Shiley, MD 03/20/2019, 11:18 AM

## 2019-03-20 NOTE — Progress Notes (Signed)
Renal Navigator notified OP HD isolation clinic/[facility] of patient's discharge today, and to expect patient back to clinic tomorrow, 6/3 for next OP HD treatment in order to provide continuity of care.  Alphonzo Cruise, [place] Renal Navigator [phone]

## 2019-03-28 NOTE — Discharge Summary (Signed)
Physician Discharge Summary  Colin [name] FTD:[id number] DOB: 1966-05-23 DOA: 03/09/2019  PCP: Colin Arabian, MD  Admit date: 03/09/2019 Discharge date: 03/20/2019  Time spent: 35 minutes  Recommendations for Outpatient Follow-up:  1. PCP ColinEhinger in 1 week, reassess heart rate and blood pressure at follow-up, determine if low-dose atenolol can be restarted   Discharge Diagnoses:  Principal Problem:   Pneumonia due to COVID-19 virus Active Problems:   Essential hypertension   Ulcerative colitis ([place])   ESRD (end stage renal disease) ([place])   Macrocytic anemia   Acute hypoxemic respiratory failure ([place])   Discharge Condition: Stable  Diet recommendation: Renal  Filed Weights   03/19/19 0600 03/19/19 0731 03/19/19 1110  Weight: 101.6 kg 101.6 kg 100.6 kg    History of present illness:  Pt. with PMH of ulcerative colitis, end-stage renal disease on hemodialysis, and hypertension; admitted on 03/09/2019, presented with complaint of shortness of breath, was found to have acute COVID  infection.  Hospital Course:   1. Acute COVID-19 infection/Acute hypoxic respiratory failure -Chest x-ray on admission showed hazy bilateral peripheral opacities -Treated with IV steroids initially, now off -Not a candidate for remdesivir due to ESRD -Actemra not given earlier in this hospital stay due to history of ulcerative colitis and clinical improvement -Clinically improving, inflammatory markers namely CRP, LDH, ferritin trending down as well -Required as much as 6 L earlier this hospital stay, weaned off O2 at the time of discharge, was also treated with higher dose heparin for DVT prophylaxis -discharged home in stable condition, will go to the dialysis center for COVID positive patient's  2.  ESRD on HD. Continue HD MWF,  -Followed by nephrology this admission -Last COVID-19 PCR was positive from 5/23  3.  Ulcerative colitis. -had cramps and diarrhea this weekend  -Appears to  have improved, continue supportive care  -Humira on hold, followed by GI Colin [name]   4.  Anemia of chronic kidney disease. -Stable, continue EPO and iron with HD  5.  Essential hypertension. Chronic diastolic CHF on HD -antiHypertensives held, BP was soft through most of this hospitalization, hence meds discontinued at discharge -Reassess at follow-up  Consultations:  Nephrology  Discharge Exam: Vitals:   03/19/19 1110 03/19/19 2321  BP: (!) 125/91 122/87  Pulse: 82 86  Resp: 18 16  Temp: 98.5 F (36.9 C) 98.4 F (36.9 C)  SpO2: 97% 94%    General: AAOx3 Cardiovascular: S1S2/RRR Respiratory: CTAB  Discharge Instructions   Discharge Instructions    Diet - low sodium heart healthy   Complete by:  As directed    Increase activity slowly   Complete by:  As directed      Allergies as of 03/20/2019      Reactions   Sulfa Antibiotics Shortness Of Breath, Rash      Medication List    STOP taking these medications   amLODipine 10 MG tablet Commonly known as:  NORVASC   atenolol 50 MG tablet Commonly known as:  TENORMIN   bromocriptine 2.5 MG tablet Commonly known as:  PARLODEL   Humira Pen 40 MG/0.4ML Pnkt Generic drug:  Adalimumab   hydrALAZINE 50 MG tablet Commonly known as:  APRESOLINE   lidocaine 5 % ointment Commonly known as:  XYLOCAINE     TAKE these medications   acetaminophen 500 MG tablet Commonly known as:  TYLENOL Take 500 mg by mouth every 6 (six) hours as needed for mild pain or fever.   allopurinol 100 MG tablet Commonly known  as:  ZYLOPRIM Take 150 mg by mouth daily.   ferrous sulfate 325 (65 FE) MG tablet Take 325 mg by mouth at bedtime.   multivitamin Tabs tablet Take 1 tablet by mouth at bedtime.   ondansetron 4 MG disintegrating tablet Commonly known as:  Zofran ODT Take 1 tablet (4 mg total) by mouth every 8 (eight) hours as needed for nausea or vomiting.   pravastatin 40 MG tablet Commonly known as:  PRAVACHOL Take  40 mg by mouth at bedtime.   sevelamer carbonate 800 MG tablet Commonly known as:  RENVELA Take 2,400 mg by mouth 3 (three) times daily with meals.      Allergies  Allergen Reactions  . Sulfa Antibiotics Shortness Of Breath and Rash   Follow-up Information    Colin Arabian, MD. Schedule an appointment as soon as possible for a visit in 1 week(s).   Specialty:  Family Medicine Why:  Hospital discharge follow up Contact information: [address], [place] [address] [phone]        Colin Ada, MD Follow up in 2 week(s).   Specialty:  Gastroenterology Why:  ulcerative collitis  Contact information: [address] [place] [address] [phone]        continue dialysis MWF Follow up.   Why:  Colin [name] MWF 5:45 pm        Colin [name], [facility] Follow up.   Why:  mwf dialysis 5:45pm Contact information: Colin [name] [address] [phone]            The results of significant diagnostics from this hospitalization (including imaging, microbiology, ancillary and laboratory) are listed below for reference.    Significant Diagnostic Studies: Dg Chest 1 View  Result Date: 03/05/2019 CLINICAL DATA:  Cough and shortness of breath EXAM: CHEST  1 VIEW COMPARISON:  05/07/2017 FINDINGS: Normal heart size and mediastinal contours. No acute infiltrate or edema. No effusion or pneumothorax. No acute osseous findings. Artifact from EKG leads. IMPRESSION: Negative chest. Electronically Signed   By: Monte Fantasia M.D.   On: 03/05/2019 09:20   Dg Chest Port 1 View  Result Date: 03/16/2019 CLINICAL DATA:  Follow-up pneumonia. + Covid EXAM: PORTABLE CHEST 1 VIEW COMPARISON:  03/10/2019 FINDINGS: There is moderate cardiac enlargement. Decreased lung volumes. Multifocal bilateral pulmonary opacities are identified, right greater than left. When compared with 03/10/2019 there is been interval worsening aeration to  the entire right lung and left lower lobe. IMPRESSION: 1. Increase in bilateral pulmonary opacities compatible with inflammatory/infectious process. Electronically Signed   By: Kerby Moors M.D.   On: 03/16/2019 09:30   Dg Chest Portable 1 View  Result Date: 03/10/2019 CLINICAL DATA:  Shortness of breath, positive COVID-19 EXAM: PORTABLE CHEST 1 VIEW COMPARISON:  03/05/2019 FINDINGS: Cardiac shadow is mildly prominent but accentuated by the portable technique. Patchy densities are identified throughout both lungs predominately within the bases but laterally on the right in the mid lung new from the prior exam. These changes would correspond with the given clinical history of positive COVID-19. No sizable effusion is noted. No bony abnormality is seen. IMPRESSION: Mild patchy infiltrates bilaterally right greater than left. Electronically Signed   By: Inez Catalina M.D.   On: 03/10/2019 00:58    Microbiology: Recent Results (from the past 240 hour(s))  MRSA PCR Screening     Status: None   Collection Time: 03/19/19 11:21 PM  Result Value Ref Range Status   MRSA by PCR NEGATIVE  NEGATIVE Final    Comment:        The GeneXpert MRSA Assay (FDA approved for NASAL specimens only), is one component of a comprehensive MRSA colonization surveillance program. It is not intended to diagnose MRSA infection nor to guide or monitor treatment for MRSA infections. Performed at [facility], [place] [address] [place], [place] [address]   SARS Coronavirus 2 (CEPHEID- Performed in [facility] hospital lab), Hosp Order     Status: Abnormal   Collection Time: 03/20/19 11:23 AM  Result Value Ref Range Status   SARS Coronavirus 2 POSITIVE (A) NEGATIVE Final    Comment: RESULT CALLED TO, READ BACK BY AND VERIFIED WITH: Marybelle Killings RN 14:50 03/20/19 ([email or web address]) (NOTE) If result is NEGATIVE SARS-CoV-2 target nucleic acids are NOT DETECTED. The SARS-CoV-2 RNA is generally detectable in upper and lower   respiratory specimens during the acute phase of infection. The lowest  concentration of SARS-CoV-2 viral copies this assay can detect is 250  copies / mL. A negative result does not preclude SARS-CoV-2 infection  and should not be used as the sole basis for treatment or other  patient management decisions.  A negative result may occur with  improper specimen collection / handling, submission of specimen other  than nasopharyngeal swab, presence of viral mutation(s) within the  areas targeted by this assay, and inadequate number of viral copies  (<250 copies / mL). A negative result must be combined with clinical  observations, patient history, and epidemiological information. If result is POSITIVE SARS-CoV-2 target nucleic acids are DETECTED. Th e SARS-CoV-2 RNA is generally detectable in upper and lower  respiratory specimens during the acute phase of infection.  Positive  results are indicative of active infection with SARS-CoV-2.  Clinical  correlation with patient history and other diagnostic information is  necessary to determine patient infection status.  Positive results do  not rule out bacterial infection or co-infection with other viruses. If result is PRESUMPTIVE POSTIVE SARS-CoV-2 nucleic acids MAY BE PRESENT.   A presumptive positive result was obtained on the submitted specimen  and confirmed on repeat testing.  While 2019 novel coronavirus  (SARS-CoV-2) nucleic acids may be present in the submitted sample  additional confirmatory testing may be necessary for epidemiological  and / or clinical management purposes  to differentiate between  SARS-CoV-2 and other Sarbecovirus currently known to infect humans.  If clinically indicated additional testing with an alternate test  methodology [phone]) is  advised. The SARS-CoV-2 RNA is generally  detectable in upper and lower respiratory specimens during the acute  phase of infection. The expected result is Negative. Fact  Sheet for Patients:  StrictlyIdeas.no Fact Sheet for Healthcare Providers: BankingDealers.co.za This test is not yet approved or cleared by the [place] FDA and has been authorized for detection and/or diagnosis of SARS-CoV-2 by FDA under an Emergency Use Authorization (EUA).  This EUA will remain in effect (meaning this test can be used) for the duration of the COVID-19 declaration under Section 564(b)(1) of the Act, 21 U.S.C. section 360bbb-3(b)(1), unless the authorization is terminated or revoked sooner. Performed at [facility], [place] [address], [place], [place] [address]      Labs: Basic Metabolic Panel: No results for input(s): NA, K, CL, CO2, GLUCOSE, BUN, CREATININE, CALCIUM, MG, PHOS in the last 168 hours. Liver Function Tests: No results for input(s): AST, ALT, ALKPHOS, BILITOT, PROT, ALBUMIN in the last 168 hours. No results for input(s): LIPASE, AMYLASE in the last  168 hours. No results for input(s): AMMONIA in the last 168 hours. CBC: No results for input(s): WBC, NEUTROABS, HGB, HCT, MCV, PLT in the last 168 hours. Cardiac Enzymes: No results for input(s): CKTOTAL, CKMB, CKMBINDEX, TROPONINI in the last 168 hours. BNP: BNP (last 3 results) No results for input(s): BNP in the last 8760 hours.  ProBNP (last 3 results) No results for input(s): PROBNP in the last 8760 hours.  CBG: No results for input(s): GLUCAP in the last 168 hours.     Signed:  Domenic Polite MD.  [facility] 03/28/2019, 10:26 AM

## 2019-05-29 IMAGING — DX CHEST  1 VIEW
1 series · 1 of 1 positions shown · non-contrast
Comparison: 05/07/2017

CLINICAL DATA: Cough and shortness of breath

EXAM:
CHEST  1 VIEW

[chest ap]
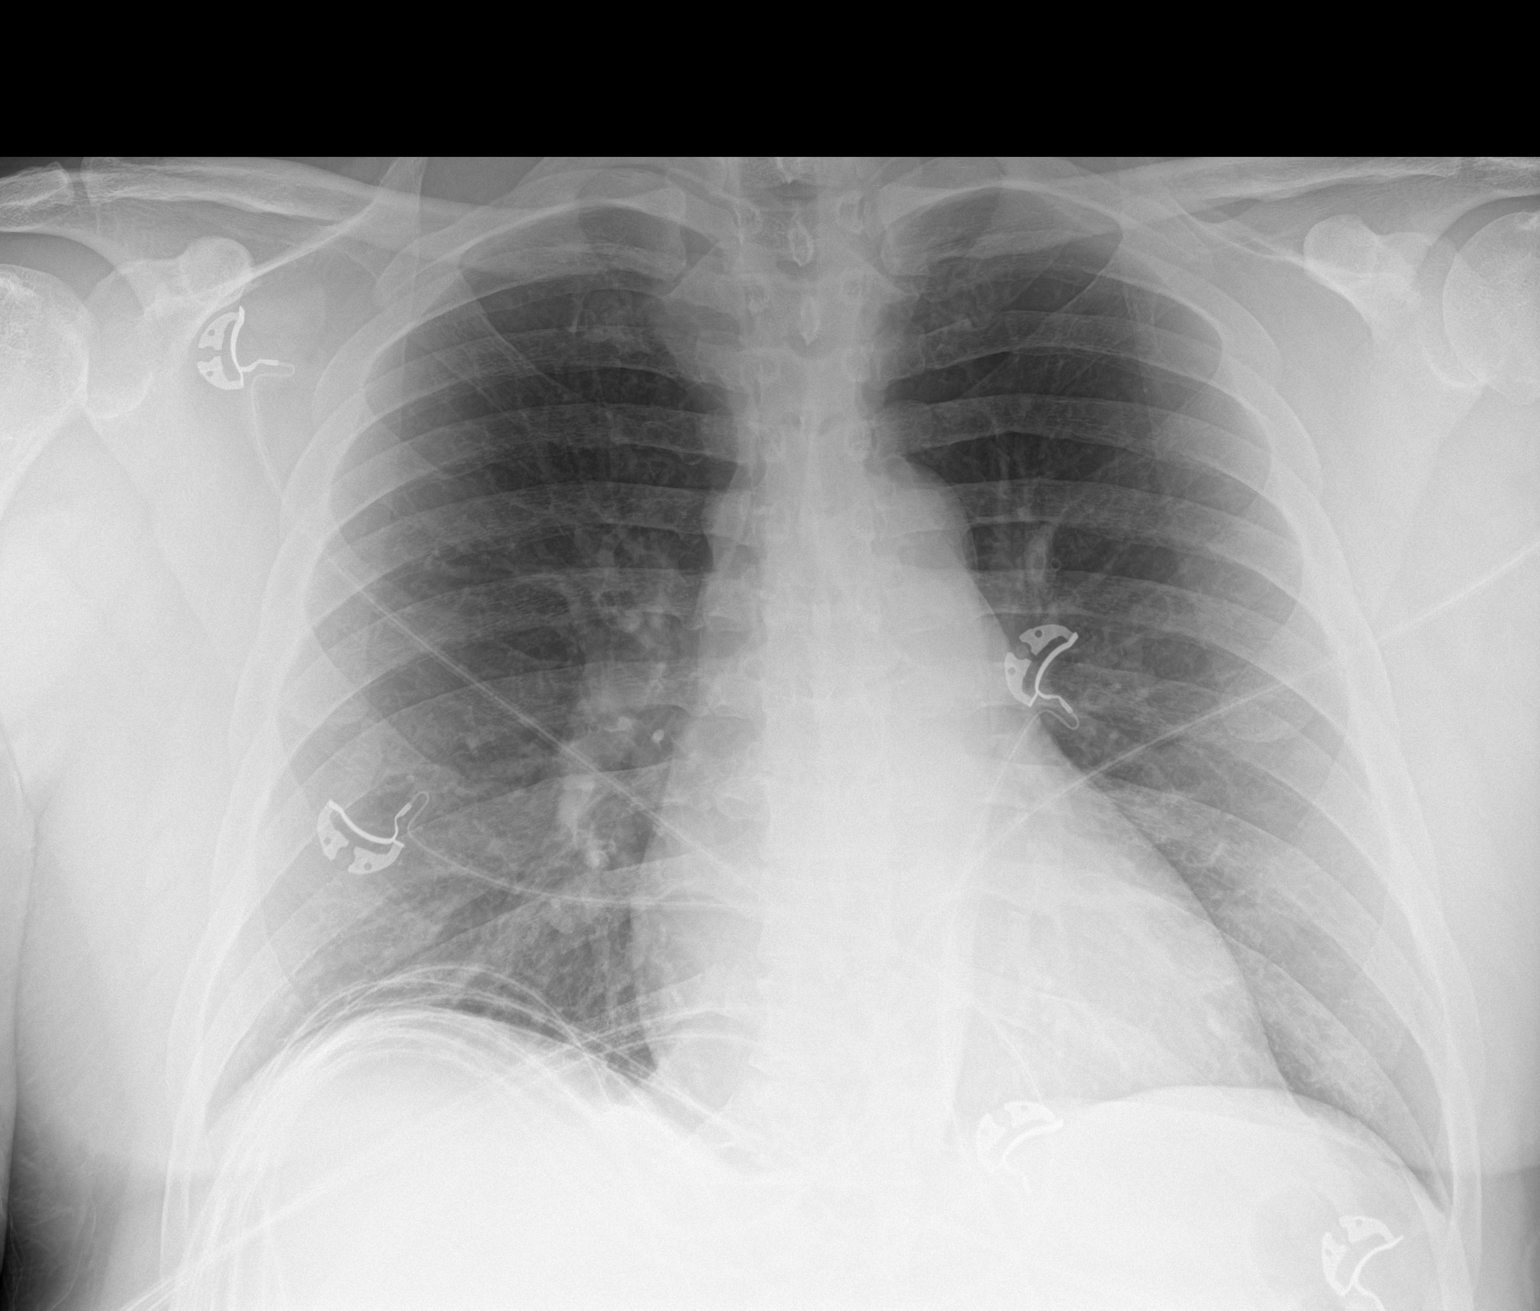

[1 of 1 positions shown; findings below may reference images not displayed]

FINDINGS: Normal heart size and mediastinal contours. No acute infiltrate or
edema. No effusion or pneumothorax. No acute osseous findings.

Artifact from EKG leads.
IMPRESSION: Negative chest.

## 2019-06-03 IMAGING — DX PORTABLE CHEST - 1 VIEW
1 series · 1 of 1 positions shown · non-contrast
Comparison: 03/05/2019

CLINICAL DATA: Shortness of breath, positive NCDGM-U2

EXAM:
PORTABLE CHEST 1 VIEW

[chest ap]
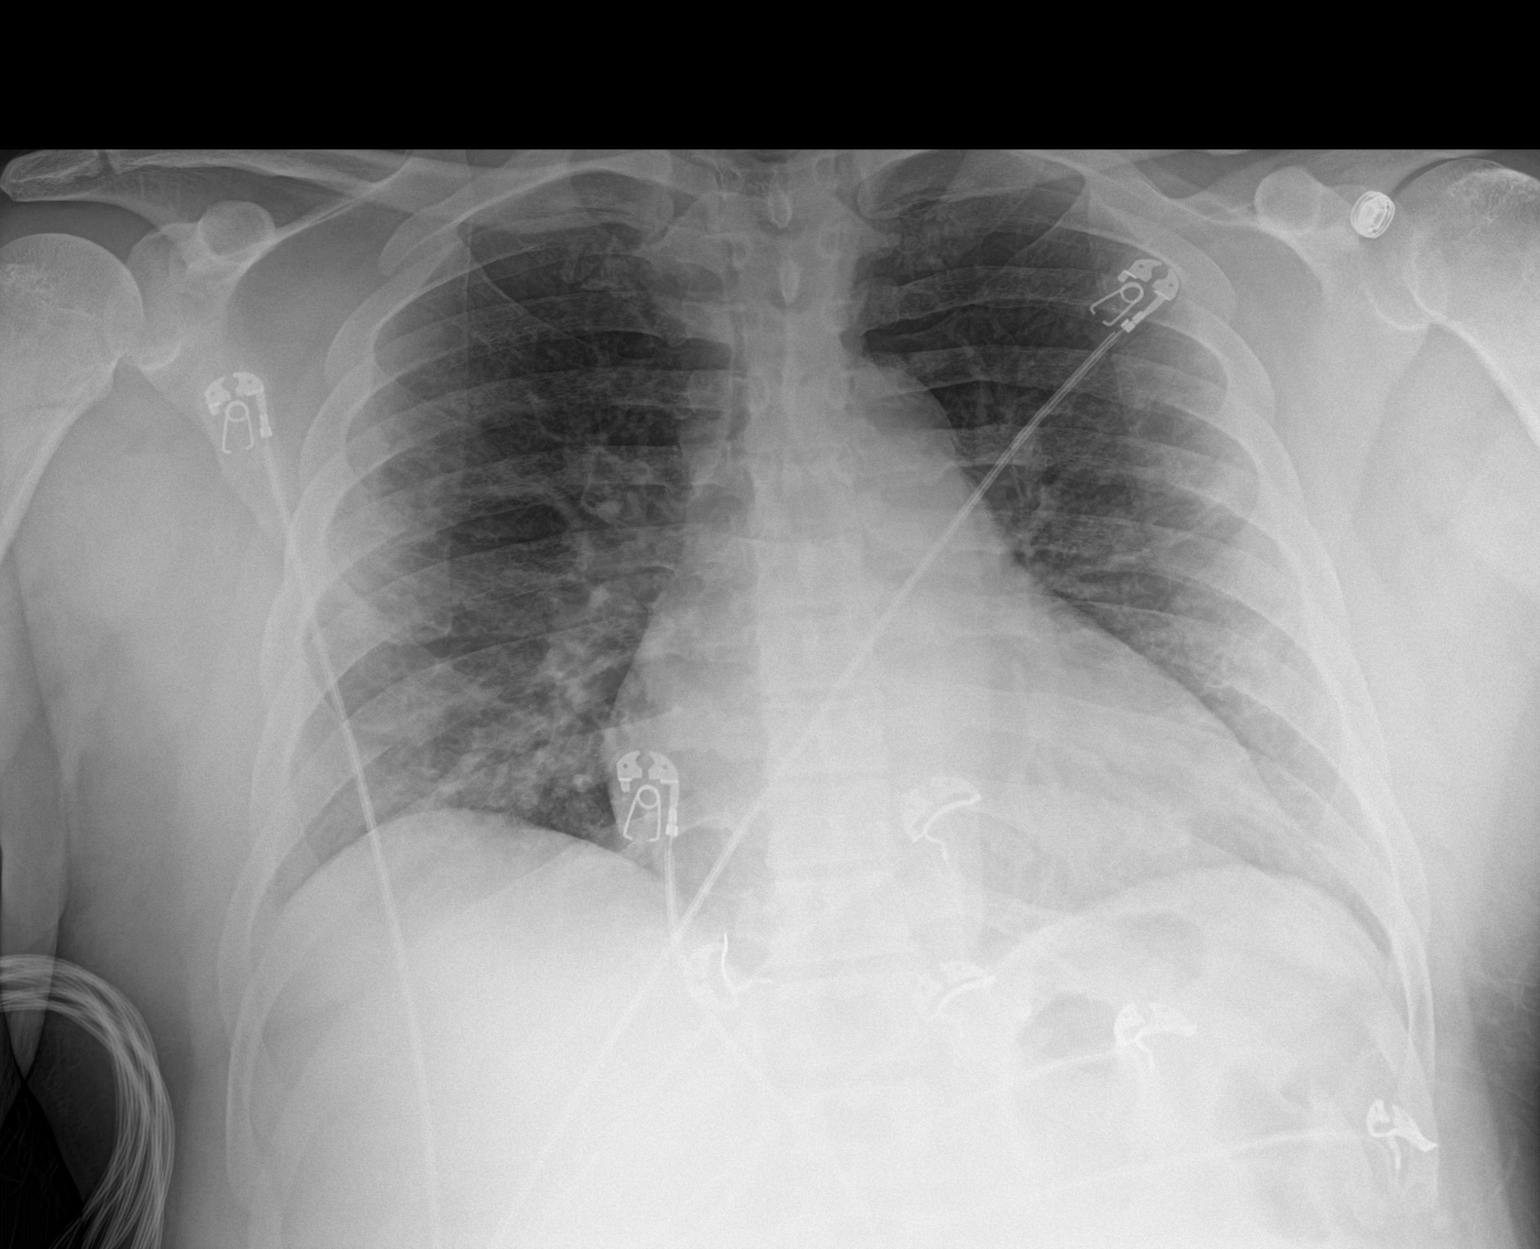

[1 of 1 positions shown; findings below may reference images not displayed]

FINDINGS: Cardiac shadow is mildly prominent but accentuated by the portable
technique. Patchy densities are identified throughout both lungs
predominately within the bases but laterally on the right in the mid
lung new from the prior exam. These changes would correspond with
the given clinical history of positive NCDGM-U2. No sizable effusion
is noted. No bony abnormality is seen.
IMPRESSION: Mild patchy infiltrates bilaterally right greater than left.

## 2019-12-27 ENCOUNTER — Telehealth: Payer: Self-pay | Admitting: Podiatry

## 2019-12-27 NOTE — Telephone Encounter (Signed)
Pt requested medical records form be e-mailed to him at [email or web address]. Told pt he would need to physically sign the release form and once completed he could return it to me via fax, e-mail, or bringing into the office.

## 2019-12-27 NOTE — Telephone Encounter (Signed)
I'm trying to obtain a copy of my medical records. If you could give me a call back please. Thank you.

## 2020-01-01 NOTE — Telephone Encounter (Signed)
Left voicemail letting pt know his records are ready to be picked up at the front desk at his convenience. Told him to call me with any other questions.

## 2020-03-23 ENCOUNTER — Ambulatory Visit (HOSPITAL_COMMUNITY)
Admission: EM | Admit: 2020-03-23 | Discharge: 2020-03-23 | Disposition: A | Discharge status: Home / Self Care | Payer: Commercial Managed Care - PPO

## 2020-03-23 ENCOUNTER — Encounter (HOSPITAL_COMMUNITY): Payer: Self-pay

## 2020-03-23 DIAGNOSIS — H60392 Other infective otitis externa, left ear: Secondary | ICD-10-CM | POA: Diagnosis not present

## 2020-03-23 MED ORDER — NEOMYCIN-POLYMYXIN-HC 3.5-10000-1 OT SUSP: 4.0000 [drp] | Freq: Three times a day (TID) | OTIC | 0 refills | Status: DC

## 2020-03-23 NOTE — ED Provider Notes (Signed)
[place]    CSN: [id number] Arrival date & time: 03/23/20  1616      History   Chief Complaint Chief Complaint  Patient presents with  . Otalgia    HPI Odyn [name] is a 54 y.o. male.   Patient presents for evaluation of 1 day history of left ear pain.  Reports yesterday began having a out and sharp left ear pain.  He reports it hurts to move and touch the left ear.  Denies decrease in hearing.  Denies dizziness or ringing in the ears.  Denies any drainage from the ear.  Denies any trauma to the ear.  Does not use Q-tips or insert other objects into the ear.  No recent upper respiratory congestion.  No fevers no chills.  He reports the pain does radiate somewhat down into his neck.  Denies any pain behind the ear.     Past Medical History:  Diagnosis Date  . Anemia    iron deficiency  . Arthritis    Gout  . CKD (chronic kidney disease) stage 3, GFR 30-59 ml/min    Stage 4  . Gout   . History of chicken pox   . Hypertension   . Melanosis coli   . Mumps   . Pneumonia   . Ulcerative colitis [place], [place]    Patient Active Problem List   Diagnosis Date Noted  . Pneumonia due to COVID-19 virus 03/10/2019  . Macrocytic anemia 03/10/2019  . Acute hypoxemic respiratory failure ([place])   . Hyperprolactinemia ([place]) 01/17/2018  . Screening for malignant neoplasm of prostate 01/17/2018  . Gynecomastia 01/17/2018  . Ingrown toenail 06/21/2017  . Onychomycosis 06/21/2017  . ESRD (end stage renal disease) ([place]) 02/07/2017  . Morbid obesity ([place]) 02/07/2017  . Bilateral leg edema 10/02/2015  . Hyperlipidemia 10/02/2015  . Edema 02/09/2011  . Chronic renal insufficiency 02/09/2011  . ANEMIA, IRON DEFICIENCY 10/16/2007  . Essential hypertension 10/16/2007  . Ulcerative colitis ([place]) 02/13/2007  . MELANOSIS COLI 02/13/2007    Past Surgical History:  Procedure Laterality Date  . AV FISTULA PLACEMENT Right 08/27/2016   Procedure: RIGHT RADIOCEPHALIC  ARTERIOVENOUS FISTULA CREATION;  Surgeon: [name], MD;  Location: [facility] OR;  Service: Vascular;  Laterality: Right;  . AV FISTULA PLACEMENT Right 10/21/2017   Procedure: ARTERIOVENOUS (AV) FISTULA CREATION RIGHT ARM;  Surgeon: [name], MD;  Location: [facility];  Service: Vascular;  Laterality: Right;  . COLONOSCOPY    . FISTULA SUPERFICIALIZATION Right 11/12/2016   Procedure: FISTULA SUPERFICIALIZATION RIGHT FOREEARM;  Surgeon: [name], MD;  Location: [facility] OR;  Service: Vascular;  Laterality: Right;  . REVISON OF ARTERIOVENOUS FISTULA Right 12/23/2017   Procedure: CEPHALIC VEIN TRANSPOSITION ARTERIOVENOUS FISTULA RIGHT UPPER ARM;  Surgeon: [name], MD;  Location: [facility] OR;  Service: Vascular;  Laterality: Right;  . TENDON REPAIR  1987   Left index finger       Home Medications    Prior to Admission medications   Medication Sig Start Date End Date Taking? Authorizing Provider  acetaminophen (TYLENOL) 500 MG tablet Take 500 mg by mouth every 6 (six) hours as needed for mild pain or fever.     [provider]  allopurinol (ZYLOPRIM) 100 MG tablet Take 150 mg by mouth daily.    [provider]  atenolol (TENORMIN) 50 MG tablet Take 50 mg by mouth daily. 01/14/20   [provider]  ferrous sulfate 325 (65 FE) MG  tablet Take 325 mg by mouth at bedtime.    [provider]  multivitamin (RENA-VIT) TABS tablet Take 1 tablet by mouth at bedtime. 07/24/18   [provider]  neomycin-polymyxin-hydrocortisone (CORTISPORIN) 3.5-10000-1 OTIC suspension Place 4 drops into the left ear 3 (three) times daily. 03/23/20   Kaushik Maul, Marguerita Beards, PA-C  ondansetron (ZOFRAN ODT) 4 MG disintegrating tablet Take 1 tablet (4 mg total) by mouth every 8 (eight) hours as needed for nausea or vomiting. 03/05/19   Albrizze, Verline Lema E, PA-C  pravastatin (PRAVACHOL) 40 MG tablet Take 40 mg by mouth at bedtime.     [provider]  sevelamer carbonate (RENVELA) 800 MG tablet  Take 2,400 mg by mouth 3 (three) times daily with meals.     [provider]    Family History Family History  Problem Relation Age of Onset  . Hypertension Father   . Heart disease Father   . Renal Disease Father   . Hypertension Mother     Social History Social History   Tobacco Use  . Smoking status: Never Smoker  . Smokeless tobacco: Former Systems developer    Types: Chew  Substance Use Topics  . Alcohol use: Yes    Comment: rare  . Drug use: No     Allergies   Sulfa antibiotics   Review of Systems Review of Systems   Physical Exam Triage Vital Signs ED Triage Vitals  Enc Vitals Group     BP 03/23/20 1730 125/76     Pulse Rate 03/23/20 1730 82     Resp 03/23/20 1730 16     Temp 03/23/20 1730 98 F (36.7 C)     Temp Source 03/23/20 1730 Oral     SpO2 03/23/20 1730 98 %     Weight 03/23/20 1731 220 lb (99.8 kg)     Height 03/23/20 1731 5' 5"  (1.651 m)     Head Circumference --      Peak Flow --      Pain Score 03/23/20 1730 8     Pain Loc --      Pain Edu? --      Excl. in [place]? --    No data found.  Updated Vital Signs BP 125/76   Pulse 82   Temp 98 F (36.7 C) (Oral)   Resp 16   Ht 5' 5"  (1.651 m)   Wt 220 lb (99.8 kg)   SpO2 98%   BMI 36.61 kg/m   Visual Acuity Right Eye Distance:   Left Eye Distance:   Bilateral Distance:    Right Eye Near:   Left Eye Near:    Bilateral Near:     Physical Exam Vitals and nursing note reviewed.  Constitutional:      Appearance: Normal appearance. He is well-developed. He is not ill-appearing.  HENT:     Head: Normocephalic and atraumatic.     Right Ear: Tympanic membrane normal.     Left Ear: Tympanic membrane normal.     Ears:     Comments: Left ear with significant pain with manipulation of the tragus.  There is swelling and mild discharge in the external canal.  No mastoid swelling or tenderness    Nose: Nose normal. No congestion.     Mouth/Throat:     Mouth: Mucous membranes are moist.      Pharynx: Oropharynx is clear.  Eyes:     Conjunctiva/sclera: Conjunctivae normal.  Cardiovascular:     Rate and Rhythm: Normal rate.  Pulmonary:     Effort: Pulmonary effort is normal. No respiratory distress.  Musculoskeletal:     Cervical back: Neck supple.  Skin:    General: Skin is warm and dry.  Neurological:     Mental Status: He is alert.      UC Treatments / Results  Labs (all labs ordered are listed, but only abnormal results are displayed) Labs Reviewed - No data to display  EKG   Radiology No results found.  Procedures Procedures (including critical care time)  Medications Ordered in UC Medications - No data to display  Initial Impression / Assessment and Plan / UC Course  I have reviewed the triage vital signs and the nursing notes.  Pertinent labs & imaging results that were available during my care of the patient were reviewed by me and considered in my medical decision making (see chart for details).     #Otitis externa Patient is a 54 year old presenting with left-sided otitis externa.  No evidence of tympanic membrane involvement.  will place on Cortisporin drops.  Recommend Tylenol for pain.  Discussed expectations of symptom improvement.  Discussed return and follow-up precautions.  Patient verbalized understanding. Final Clinical Impressions(s) / UC Diagnoses   Final diagnoses:  Other infective acute otitis externa of left ear     Discharge Instructions     Use the eardrops 3 times a day, let it sit in the ear for 5 minutes at a time Use these until you have had symptom resolution and then 1 to 2 days in addition.  This should be up to 10 days in total  Take 2 extra strength Tylenol every 8 hours for the pain.  He may also apply warm compresses.  If not improving over the next 2 to 3 days please follow-up with your primary care     ED Prescriptions    Medication Sig Dispense Auth. Provider   neomycin-polymyxin-hydrocortisone  (CORTISPORIN) 3.5-10000-1 OTIC suspension Place 4 drops into the left ear 3 (three) times daily. 10 mL Renso Swett, Marguerita Beards, PA-C     PDMP not reviewed this encounter.   Purnell Shoemaker, PA-C 03/23/20 2134

## 2020-03-23 NOTE — ED Triage Notes (Signed)
Pt c/o 8/10 sharp pain in left ear started yesterday. Pt denies any other symptoms.

## 2020-03-23 NOTE — Discharge Instructions (Signed)
Use the eardrops 3 times a day, let it sit in the ear for 5 minutes at a time Use these until you have had symptom resolution and then 1 to 2 days in addition.  This should be up to 10 days in total  Take 2 extra strength Tylenol every 8 hours for the pain.  He may also apply warm compresses.  If not improving over the next 2 to 3 days please follow-up with your primary care

## 2020-03-24 ENCOUNTER — Other Ambulatory Visit: Payer: Self-pay

## 2020-03-24 ENCOUNTER — Encounter (HOSPITAL_COMMUNITY): Payer: Self-pay

## 2020-03-24 ENCOUNTER — Ambulatory Visit (HOSPITAL_COMMUNITY)
Admission: EM | Admit: 2020-03-24 | Discharge: 2020-03-24 | Disposition: A | Discharge status: Home / Self Care | Payer: Commercial Managed Care - PPO | Attending: Family Medicine | Admitting: Family Medicine

## 2020-03-24 DIAGNOSIS — H60393 Other infective otitis externa, bilateral: Secondary | ICD-10-CM | POA: Diagnosis not present

## 2020-03-24 MED ORDER — TRAMADOL HCL 50 MG PO TABS: 50.0000 mg | ORAL_TABLET | Freq: Four times a day (QID) | ORAL | 0 refills | Status: DC | PRN

## 2020-03-24 NOTE — Discharge Instructions (Signed)
You also have an infection in your right ear canal, as well as the current left ear canal infection  You have the correct antibiotic drops, I have sent in some pain medication as well  Follow up in 2-3 days if not improving

## 2020-03-24 NOTE — ED Provider Notes (Signed)
[facility]   [id number] 03/24/20 Arrival Time: 0800  CC: EAR PAIN  SUBJECTIVE: History from: patient.  Colin [name] is a 54 y.o. male who presents with of right ear pain for the past day. Was seen in this office yesterday for left otitis externa and prescribed Cortisporin ear drops. Reports that he has been taking Tylenol for pain with no relief and was unable to sleep last night due to the pain. Reports that the pain in his left ear has increased since yesterday. Reports that he has used the ear drops x 3 in the left ear. Denies a precipitating event, such as swimming or wearing ear plugs. Patient states the pain is constant and achy in character. Symptoms are made worse with lying down. Denies fever, chills, fatigue, sinus pain, rhinorrhea, ear discharge, sore throat, SOB, wheezing, chest pain, nausea, changes in bowel or bladder habits.    ROS: As per HPI.  All other pertinent ROS negative.     Past Medical History:  Diagnosis Date  . Anemia    iron deficiency  . Arthritis    Gout  . CKD (chronic kidney disease) stage 3, GFR 30-59 ml/min    Stage 4  . Gout   . History of chicken pox   . Hypertension   . Melanosis coli   . Mumps   . Pneumonia   . Ulcerative colitis [place], [place]   Past Surgical History:  Procedure Laterality Date  . AV FISTULA PLACEMENT Right 08/27/2016   Procedure: RIGHT RADIOCEPHALIC ARTERIOVENOUS FISTULA CREATION;  Surgeon: [name], MD;  Location: [facility] OR;  Service: Vascular;  Laterality: Right;  . AV FISTULA PLACEMENT Right 10/21/2017   Procedure: ARTERIOVENOUS (AV) FISTULA CREATION RIGHT ARM;  Surgeon: [name], MD;  Location: [place] [facility];  Service: Vascular;  Laterality: Right;  . COLONOSCOPY    . FISTULA SUPERFICIALIZATION Right 11/12/2016   Procedure: FISTULA SUPERFICIALIZATION RIGHT FOREEARM;  Surgeon: [name], MD;  Location: [facility] OR;  Service: Vascular;  Laterality: Right;  . REVISON OF ARTERIOVENOUS FISTULA Right 12/23/2017    Procedure: CEPHALIC VEIN TRANSPOSITION ARTERIOVENOUS FISTULA RIGHT UPPER ARM;  Surgeon: [name], MD;  Location: [facility] OR;  Service: Vascular;  Laterality: Right;  . TENDON REPAIR  1987   Left index finger   Allergies  Allergen Reactions  . Sulfa Antibiotics Shortness Of Breath and Rash   No current facility-administered medications on file prior to encounter.   Current Outpatient Medications on File Prior to Encounter  Medication Sig Dispense Refill  . acetaminophen (TYLENOL) 500 MG tablet Take 500 mg by mouth every 6 (six) hours as needed for mild pain or fever.     Marland Kitchen allopurinol (ZYLOPRIM) 100 MG tablet Take 150 mg by mouth daily.    Marland Kitchen atenolol (TENORMIN) 50 MG tablet Take 50 mg by mouth daily.    . ferrous sulfate 325 (65 FE) MG tablet Take 325 mg by mouth at bedtime.    . multivitamin (RENA-VIT) TABS tablet Take 1 tablet by mouth at bedtime.  10  . neomycin-polymyxin-hydrocortisone (CORTISPORIN) 3.5-10000-1 OTIC suspension Place 4 drops into the left ear 3 (three) times daily. 10 mL 0  . ondansetron (ZOFRAN ODT) 4 MG disintegrating tablet Take 1 tablet (4 mg total) by mouth every 8 (eight) hours as needed for nausea or vomiting. 8 tablet 0  . pravastatin (PRAVACHOL) 40 MG tablet Take 40 mg by mouth at bedtime.     . sevelamer carbonate (RENVELA)  800 MG tablet Take 2,400 mg by mouth 3 (three) times daily with meals.      Social History   Socioeconomic History  . Marital status: Married    Spouse name: Not on file  . Number of children: Not on file  . Years of education: Not on file  . Highest education level: Not on file  Occupational History  . Not on file  Tobacco Use  . Smoking status: Never Smoker  . Smokeless tobacco: Former Systems developer    Types: Chew  Substance and Sexual Activity  . Alcohol use: Yes    Comment: rare  . Drug use: No  . Sexual activity: Not on file  Other Topics Concern  . Not on file  Social History Narrative  . Not on file   Social Determinants  of Health   Financial Resource Strain:   . Difficulty of Paying Living Expenses:   Food Insecurity:   . Worried About Charity fundraiser in the Last Year:   . Arboriculturist in the Last Year:   Transportation Needs:   . Film/video editor (Medical):   Marland Kitchen Lack of Transportation (Non-Medical):   Physical Activity:   . Days of Exercise per Week:   . Minutes of Exercise per Session:   Stress:   . Feeling of Stress :   Social Connections:   . Frequency of Communication with Friends and Family:   . Frequency of Social Gatherings with Friends and Family:   . Attends Religious Services:   . Active Member of Clubs or Organizations:   . Attends Archivist Meetings:   Marland Kitchen Marital Status:   Intimate Partner Violence:   . Fear of Current or Ex-Partner:   . Emotionally Abused:   Marland Kitchen Physically Abused:   . Sexually Abused:    Family History  Problem Relation Age of Onset  . Hypertension Father   . Heart disease Father   . Renal Disease Father   . Hypertension Mother     OBJECTIVE:  Vitals:   03/24/20 0813 03/24/20 0814  BP:  (!) 152/97  Pulse: 87   Resp: 16   Temp: 98.2 F (36.8 C)   SpO2: 98%      General appearance: alert; appears fatigued HEENT: Ears: L EAC with white and yellow drainage, R EAC with white and yellow fluid in ear canal, both canals very tender to palpation, TMs pearly gray with visible cone of light, without erythema; Eyes: PERRL, EOMI grossly; Sinuses nontender to palpation; Nose: clear rhinorrhea; Throat: oropharynx mildly erythematous, tonsils 1+ without white tonsillar exudates, uvula midline Neck: supple without LAD Lungs: unlabored respirations, symmetrical air entry; cough: absent; no respiratory distress Heart: regular rate and rhythm.  Radial pulses 2+ symmetrical bilaterally Skin: warm and dry Psychological: alert and cooperative; normal mood and affect  Imaging: No results found.   ASSESSMENT & PLAN:  1. Other infective acute  otitis externa of both ears     Meds ordered this encounter  Medications  . traMADol (ULTRAM) 50 MG tablet    Sig: Take 1 tablet (50 mg total) by mouth every 6 (six) hours as needed for moderate pain or severe pain.    Dispense:  15 tablet    Refill:  0    Order Specific Question:   Supervising Provider    Answer:   Chase Picket [[id number]]    Bilateral Otitis Externa Prescribed Tramadol for pain Continue Cortisporin drops, both ears Rest and drink plenty  of fluids Take medications as directed and to completion Continue to use OTC ibuprofen and/ or tylenol as needed for pain control Follow up with PCP if symptoms persists Return here or go to the ER if you have any new or worsening symptoms   Reviewed expectations re: course of current medical issues. Questions answered. Outlined signs and symptoms indicating need for more acute intervention. Patient verbalized understanding. After Visit Summary given.          Faustino Congress, NP 03/24/20 [phone]

## 2020-03-24 NOTE — ED Triage Notes (Signed)
Pt here yesterday for same, took meds last night but states pain is worse today

## 2020-04-07 ENCOUNTER — Encounter: Payer: Self-pay | Admitting: *Deleted

## 2020-04-08 ENCOUNTER — Ambulatory Visit: Payer: Commercial Managed Care - PPO | Admitting: Diagnostic Neuroimaging

## 2020-04-08 ENCOUNTER — Telehealth: Payer: Self-pay | Admitting: *Deleted

## 2020-04-08 NOTE — Telephone Encounter (Signed)
Patient was no show for new patient appointment today.

## 2020-04-09 ENCOUNTER — Encounter: Payer: Self-pay | Admitting: Diagnostic Neuroimaging

## 2020-05-06 ENCOUNTER — Telehealth: Payer: Self-pay | Admitting: Cardiovascular Disease

## 2020-05-06 ENCOUNTER — Ambulatory Visit: Payer: Commercial Managed Care - PPO | Admitting: Family

## 2020-05-06 DIAGNOSIS — Z01818 Encounter for other preprocedural examination: Secondary | ICD-10-CM

## 2020-05-06 NOTE — Telephone Encounter (Signed)
Left voicemail message that patient would need to arrange appointment before we could order any testing and to please call us back to get that set up.

## 2020-05-06 NOTE — Telephone Encounter (Signed)
Spoke with patient and he states that he gets yearly Lexiscan and Echo as part of the requirements for his transplant team. He states that it needs to be done prior to his upcoming appointment in August. Advised that I would need to check with a provider and then would have scheduling call to put it on and I would call with further instructions. He verbalized understanding with no further questions at this time.

## 2020-05-06 NOTE — Telephone Encounter (Signed)
Per review of office note from April with his transplant team he is due for stress testing, but not for echocardiogram. Recommend reaching out to his transplant team for clarification. He most recently saw Lynita Lombard NP-C at [facility].   Her office phone is [phone]. Her fax number is [phone].   Recommend sending his transplant team a fax to inquire if he requires stress test and echo or solely stress test.  Loel Dubonnet, NP

## 2020-05-06 NOTE — Telephone Encounter (Signed)
Patient calling in stating he was told he needs an echo and myoview before his next appointment. Please place orders if this is correct

## 2020-05-07 NOTE — Telephone Encounter (Signed)
Called number listed in previous note and that was not the correct office for that provider. She did provide me with number and pager for this provider as follows: Phone [phone] and Pager [phone]. Called phone number and left message for provider to call back for clarification of testing that is needed for transplant.

## 2020-05-09 NOTE — Progress Notes (Deleted)
Cardiology Office Note    Date:  05/09/2020   ID:  Colin [name], DOB 05/15/1966, MRN [id number]  PCP:  Gaynelle Arabian, MD  Cardiologist:  Ida Rogue, MD  Electrophysiologist:  None   Chief Complaint: Follow-up  History of Present Illness:   Colin [name] is a 54 y.o. male with history of ESRD secondary to systemic hypertension with focal segmental glomerulosclerosis, macrocytic anemia, ulcerative colitis dating back to 2000 on Humira, and HTN who presents for ***.  He is followed by the transplant service at [facility].  As part of his pretransplant evaluation he has previously undergone echo and stress test.  Most recent echo from 12/2018 demonstrated an EF of 60 to 65%, mildly dilated LV cavity size, mildly increased LV wall thickness, diastolic dysfunction, normal RV systolic function and RV cavity size with normal RV wall thickness, mildly dilated left atrium, unable to estimate RVSP, no significant valvular abnormalities.  Lexiscan MPI from 12/2018 demonstrated no significant ischemia, a small region of fixed defect in the apical region was noted and unable to exclude old scar versus attenuation artifact, normal wall motion with an EF of 67%, GI uptake artifact was noted, and no EKG changes or noted at peak stress or recovery.  Overall, this was a low risk study.  He was most recently evaluated virtually in the setting of the COVID-19 pandemic in 12/2018 and was doing well from a cardiac perspective.  He noted chronic lower extremity edema.  Review of recent CT scan from 10/2018 demonstrated mild atherosclerosis in the proximal left common iliac with otherwise no significant atherosclerosis noted within the distal aorta or femoral arteries.  He denies any symptoms concerning for angina.  ***   Labs independently reviewed: 03/2019 - potassium 4.1, BUN 69, serum creatinine 10.32, albumin 2.6, Hgb 8.2, PLT 514 02/2019 - AST/ALT normal 11/2017 - TC 121, TG 187, HDL 31, LDL 53, A1c 5.0  Past Medical  History:  Diagnosis Date  . Anemia    iron deficiency  . Arthritis    Gout  . CKD (chronic kidney disease) stage 3, GFR 30-59 ml/min    Stage 4  . Gout   . History of chicken pox   . Hypertension   . Melanosis coli   . Mumps   . Peripheral neuropathy   . Pneumonia   . Ulcerative colitis [place], [place]    Past Surgical History:  Procedure Laterality Date  . AV FISTULA PLACEMENT Right 08/27/2016   Procedure: RIGHT RADIOCEPHALIC ARTERIOVENOUS FISTULA CREATION;  Surgeon: [name], MD;  Location: [facility] OR;  Service: Vascular;  Laterality: Right;  . AV FISTULA PLACEMENT Right 10/21/2017   Procedure: ARTERIOVENOUS (AV) FISTULA CREATION RIGHT ARM;  Surgeon: [name], MD;  Location: [facility];  Service: Vascular;  Laterality: Right;  . COLONOSCOPY    . FISTULA SUPERFICIALIZATION Right 11/12/2016   Procedure: FISTULA SUPERFICIALIZATION RIGHT FOREEARM;  Surgeon: [name], MD;  Location: [place];  Service: Vascular;  Laterality: Right;  . REVISON OF ARTERIOVENOUS FISTULA Right 12/23/2017   Procedure: CEPHALIC VEIN TRANSPOSITION ARTERIOVENOUS FISTULA RIGHT UPPER ARM;  Surgeon: [name], MD;  Location: [place];  Service: Vascular;  Laterality: Right;  . TENDON REPAIR  1987   Left index finger    Current Medications: No outpatient medications have been marked as taking for the 05/20/20 encounter (Appointment) with Rise Mu, PA-C.    Allergies:   Sulfa antibiotics   Social History   Socioeconomic  History  . Marital status: Married    Spouse name: Not on file  . Number of children: Not on file  . Years of education: Not on file  . Highest education level: Not on file  Occupational History  . Not on file  Tobacco Use  . Smoking status: Never Smoker  . Smokeless tobacco: Former Systems developer    Types: Secondary school teacher  . Vaping Use: Never used  Substance and Sexual Activity  . Alcohol use: Yes    Comment: rare  . Drug use: No  . Sexual activity: Not on file  Other  Topics Concern  . Not on file  Social History Narrative  . Not on file   Social Determinants of Health   Financial Resource Strain:   . Difficulty of Paying Living Expenses:   Food Insecurity:   . Worried About Charity fundraiser in the Last Year:   . Arboriculturist in the Last Year:   Transportation Needs:   . Film/video editor (Medical):   Marland Kitchen Lack of Transportation (Non-Medical):   Physical Activity:   . Days of Exercise per Week:   . Minutes of Exercise per Session:   Stress:   . Feeling of Stress :   Social Connections:   . Frequency of Communication with Friends and Family:   . Frequency of Social Gatherings with Friends and Family:   . Attends Religious Services:   . Active Member of Clubs or Organizations:   . Attends Archivist Meetings:   Marland Kitchen Marital Status:      Family History:  The patient's family history includes Heart disease in his father; Hypertension in his father and mother; Renal Disease in his father.  ROS:   ROS   EKGs/Labs/Other Studies Reviewed:    Studies reviewed were summarized above. The additional studies were reviewed today:  2D echo 01/11/2019: 1. The left ventricle has normal systolic function with an ejection  fraction of 60-65%. The cavity size was mildly dilated. There is mildly  increased left ventricular wall thickness. Left ventricular diastolic  Doppler parameters are consistent with  impaired relaxation.  2. The right ventricle has normal systolic function. The cavity was  normal. There is no increase in right ventricular wall thickness. Unable  to estimate RVSP  3. Left atrial size was mildly dilated. __________  Carlton Adam MPI 01/16/2019: Pharmacological myocardial perfusion imaging study with no significant  Ischemia Small region of fixed defect in the apical region, unable to exclude old scar vs attenuation artifact Normal wall motion, EF estimated at 67% GI uptake artifact noted No EKG changes concerning  for ischemia at peak stress or in recovery. Low risk scan   EKG:  EKG is ordered today.  The EKG ordered today demonstrates ***  Recent Labs: No results found for requested labs within last 8760 hours.  Recent Lipid Panel No results found for: CHOL, TRIG, HDL, CHOLHDL, VLDL, LDLCALC, LDLDIRECT  PHYSICAL EXAM:    VS:  There were no vitals taken for this visit.  BMI: There is no height or weight on file to calculate BMI.  Physical Exam  Wt Readings from Last 3 Encounters:  03/23/20 220 lb (99.8 kg)  03/19/19 221 lb 12.5 oz (100.6 kg)  03/05/19 230 lb (104.3 kg)     ASSESSMENT & PLAN:   1. ***  Disposition: F/u with Dr. Rockey Situ or an APP in ***.   Medication Adjustments/Labs and Tests Ordered: Current medicines are reviewed at length  with the patient today.  Concerns regarding medicines are outlined above. Medication changes, Labs and Tests ordered today are summarized above and listed in the Patient Instructions accessible in Encounters.   Signed, Christell Faith, PA-C 05/09/2020 11:05 AM     [facility] [address] [place] [address] [place], [place] [address] [phone]

## 2020-05-10 ENCOUNTER — Emergency Department (HOSPITAL_COMMUNITY)
Admission: EM | Admit: 2020-05-10 | Discharge: 2020-05-11 | Disposition: A | Discharge status: Home / Self Care | Payer: Commercial Managed Care - PPO | Attending: Emergency Medicine | Admitting: Emergency Medicine

## 2020-05-10 ENCOUNTER — Encounter (HOSPITAL_COMMUNITY): Payer: Self-pay

## 2020-05-10 DIAGNOSIS — Z5321 Procedure and treatment not carried out due to patient leaving prior to being seen by health care provider: Secondary | ICD-10-CM | POA: Diagnosis not present

## 2020-05-10 DIAGNOSIS — R6884 Jaw pain: Secondary | ICD-10-CM | POA: Insufficient documentation

## 2020-05-10 NOTE — ED Triage Notes (Signed)
Pt arrives to ED w/ c/o L sided jaw pain that started this morning. Pt reports 6/10. Pt denies injury/trauma.

## 2020-05-10 NOTE — ED Notes (Signed)
Pt called x3 for vital recheck, no answer.

## 2020-05-11 ENCOUNTER — Encounter (HOSPITAL_COMMUNITY): Payer: Self-pay | Admitting: Emergency Medicine

## 2020-05-11 ENCOUNTER — Ambulatory Visit (HOSPITAL_COMMUNITY)
Admission: EM | Admit: 2020-05-11 | Discharge: 2020-05-11 | Disposition: A | Discharge status: Home / Self Care | Payer: Commercial Managed Care - PPO | Source: Home / Self Care

## 2020-05-11 ENCOUNTER — Other Ambulatory Visit: Payer: Self-pay

## 2020-05-11 DIAGNOSIS — M25522 Pain in left elbow: Secondary | ICD-10-CM

## 2020-05-11 DIAGNOSIS — M109 Gout, unspecified: Secondary | ICD-10-CM | POA: Diagnosis not present

## 2020-05-11 DIAGNOSIS — R6884 Jaw pain: Secondary | ICD-10-CM | POA: Diagnosis not present

## 2020-05-11 DIAGNOSIS — S0300XA Dislocation of jaw, unspecified side, initial encounter: Secondary | ICD-10-CM | POA: Diagnosis not present

## 2020-05-11 MED ORDER — COLCHICINE 0.6 MG PO TABS: 0.6000 mg | ORAL_TABLET | Freq: Every day | ORAL | 0 refills | Status: DC

## 2020-05-11 MED ORDER — HYDROCODONE-ACETAMINOPHEN 5-325 MG PO TABS: 1.0000 | ORAL_TABLET | ORAL | 0 refills | Status: DC | PRN

## 2020-05-11 NOTE — Discharge Instructions (Signed)
Sent in colchicine for you for your gout flare.  I have also sent in Quail for your jaw pain  Follow-up with the dentist if this is not helping her jaw pain  Follow-up with primary care and with dialysis as scheduled

## 2020-05-11 NOTE — ED Provider Notes (Signed)
[facility]   [id number] 05/11/20 Arrival Time: 1001  LK:GMWNU PAIN  SUBJECTIVE: History from: patient. Colin [name] is a 54 y.o. male complains of left elbow pain that began ain that began yesterday morning.  He has history of gout.  Patient is also having left lower jaw pain.  Denies any actual tooth pain.  But is having pain with talking and chewing.  He is a dialysis patient.  Has taken Tylenol for this with no relief. Denies fever, chills, erythema, ecchymosis, effusion, weakness, numbness and tingling, saddle paresthesias, loss of bowel or bladder function.      ROS: As per HPI.  All other pertinent ROS negative.     Past Medical History:  Diagnosis Date  . Anemia    iron deficiency  . Arthritis    Gout  . CKD (chronic kidney disease) stage 3, GFR 30-59 ml/min    Stage 4  . Gout   . History of chicken pox   . Hypertension   . Melanosis coli   . Mumps   . Peripheral neuropathy   . Pneumonia   . Ulcerative colitis [place], [place]   Past Surgical History:  Procedure Laterality Date  . AV FISTULA PLACEMENT Right 08/27/2016   Procedure: RIGHT RADIOCEPHALIC ARTERIOVENOUS FISTULA CREATION;  Surgeon: [name], MD;  Location: [facility] OR;  Service: Vascular;  Laterality: Right;  . AV FISTULA PLACEMENT Right 10/21/2017   Procedure: ARTERIOVENOUS (AV) FISTULA CREATION RIGHT ARM;  Surgeon: [name], MD;  Location: [place];  Service: Vascular;  Laterality: Right;  . COLONOSCOPY    . FISTULA SUPERFICIALIZATION Right 11/12/2016   Procedure: FISTULA SUPERFICIALIZATION RIGHT FOREEARM;  Surgeon: [name], MD;  Location: [facility] OR;  Service: Vascular;  Laterality: Right;  . REVISON OF ARTERIOVENOUS FISTULA Right 12/23/2017   Procedure: CEPHALIC VEIN TRANSPOSITION ARTERIOVENOUS FISTULA RIGHT UPPER ARM;  Surgeon: [name], MD;  Location: [facility] OR;  Service: Vascular;  Laterality: Right;  . TENDON REPAIR  1987   Left index finger   Allergies  Allergen Reactions  .  Sulfa Antibiotics Shortness Of Breath and Rash    Hives, itching   No current facility-administered medications on file prior to encounter.   Current Outpatient Medications on File Prior to Encounter  Medication Sig Dispense Refill  . allopurinol (ZYLOPRIM) 100 MG tablet Take 150 mg by mouth daily.    . ferrous sulfate 325 (65 FE) MG tablet Take 325 mg by mouth at bedtime.    . Methoxy PEG-Epoetin Beta (MIRCERA IJ) Mircera    . multivitamin (RENA-VIT) TABS tablet Take 1 tablet by mouth at bedtime.  10  . pravastatin (PRAVACHOL) 40 MG tablet Take 40 mg by mouth at bedtime.     . sevelamer carbonate (RENVELA) 800 MG tablet Take 2,400 mg by mouth 3 (three) times daily with meals.     Marland Kitchen acetaminophen (TYLENOL) 500 MG tablet Take 500 mg by mouth every 6 (six) hours as needed for mild pain or fever.     Marland Kitchen amitriptyline (ELAVIL) 25 MG tablet amitriptyline 25 mg tablet  TAKE 3 TABLETS BY MOUTH AT BEDTIME    . atenolol (TENORMIN) 50 MG tablet Take 50 mg by mouth daily.    Marland Kitchen neomycin-polymyxin-hydrocortisone (CORTISPORIN) 3.5-10000-1 OTIC suspension Place 4 drops into the left ear 3 (three) times daily. 10 mL 0  . ondansetron (ZOFRAN ODT) 4 MG disintegrating tablet Take 1 tablet (4 mg total) by mouth every 8 (eight) hours as  needed for nausea or vomiting. 8 tablet 0  . traMADol (ULTRAM) 50 MG tablet Take 1 tablet (50 mg total) by mouth every 6 (six) hours as needed for moderate pain or severe pain. 15 tablet 0   Social History   Socioeconomic History  . Marital status: Married    Spouse name: Not on file  . Number of children: Not on file  . Years of education: Not on file  . Highest education level: Not on file  Occupational History  . Not on file  Tobacco Use  . Smoking status: Never Smoker  . Smokeless tobacco: Former Systems developer    Types: Secondary school teacher  . Vaping Use: Never used  Substance and Sexual Activity  . Alcohol use: Yes    Comment: rare  . Drug use: No  . Sexual activity: Not  on file  Other Topics Concern  . Not on file  Social History Narrative  . Not on file   Social Determinants of Health   Financial Resource Strain:   . Difficulty of Paying Living Expenses:   Food Insecurity:   . Worried About Charity fundraiser in the Last Year:   . Arboriculturist in the Last Year:   Transportation Needs:   . Film/video editor (Medical):   Marland Kitchen Lack of Transportation (Non-Medical):   Physical Activity:   . Days of Exercise per Week:   . Minutes of Exercise per Session:   Stress:   . Feeling of Stress :   Social Connections:   . Frequency of Communication with Friends and Family:   . Frequency of Social Gatherings with Friends and Family:   . Attends Religious Services:   . Active Member of Clubs or Organizations:   . Attends Archivist Meetings:   Marland Kitchen Marital Status:   Intimate Partner Violence:   . Fear of Current or Ex-Partner:   . Emotionally Abused:   Marland Kitchen Physically Abused:   . Sexually Abused:    Family History  Problem Relation Age of Onset  . Hypertension Father   . Heart disease Father   . Renal Disease Father   . Hypertension Mother     OBJECTIVE:  Vitals:   05/11/20 1024  BP: 120/83  Pulse: 89  Resp: 20  Temp: 98.5 F (36.9 C)  TempSrc: Oral  SpO2: 97%    General appearance: ALERT; in no acute distress.  Head: NCAT Lungs: Normal respiratory effort CV:  pulses 2+ bilaterally. Cap refill < 2 seconds Musculoskeletal:  Inspection: Skin warm, dry, clear and intact without obvious erythema, effusion, or ecchymosis.  Palpation: Medial aspect of left elbow tender to palpation ROM: FROM active and passive Skin: warm and dry Neurologic: Ambulates without difficulty; Sensation intact about the upper/ lower extremities Psychological: alert and cooperative; normal mood and affect  DIAGNOSTIC STUDIES:  No results found.   ASSESSMENT & PLAN:  1. Left elbow pain   2. Acute gout of left elbow, unspecified cause   3. Jaw  pain   4. Dislocation of temporomandibular joint, initial encounter     Meds ordered this encounter  Medications  . colchicine 0.6 MG tablet    Sig: Take 1 tablet (0.6 mg total) by mouth daily.    Dispense:  10 tablet    Refill:  0    Order Specific Question:   Supervising Provider    Answer:   Chase Picket [id number]  . HYDROcodone-acetaminophen (NORCO/VICODIN) 5-325 MG tablet  Sig: Take 1 tablet by mouth every 4 (four) hours as needed for moderate pain or severe pain.    Dispense:  10 tablet    Refill:  0    Order Specific Question:   Supervising Provider    Answer:   Chase Picket [[id number]]   Prescribed colchicine for gout Prescribe Norco for jaw pain If jaw pain is not getting better over the next week, follow-up with the dentist Suspect the jaw pain is related to TMJ Continue conservative management of rest, ice, and gentle stretches Follow up with PCP if symptoms persist Return or go to the ER if you have any new or worsening symptoms (fever, chills, chest pain, abdominal pain, changes in bowel or bladder habits, pain radiating into lower legs)   [place] Controlled Substances Registry consulted for this patient. I feel the risk/benefit ratio today is favorable for proceeding with this prescription for a controlled substance. Medication sedation precautions given.  Reviewed expectations re: course of current medical issues. Questions answered. Outlined signs and symptoms indicating need for more acute intervention. Patient verbalized understanding. After Visit Summary given.       Faustino Congress, NP 05/11/20 1052

## 2020-05-11 NOTE — ED Triage Notes (Addendum)
Patient reports his left elbow started hurting yesterday morning.  History of gout.  Patient says it feels like gout.    Patient is having left jaw or TMJ painwith movement of mouth, chewing, talking .  States this started yesterday  Last visit in June 2021 was for a left ear ache

## 2020-05-11 NOTE — ED Notes (Signed)
Pt called x3 for vital sign recheck and registration. No response. Pt not seen within [facility] ED waiting room, waiting room bathrooms, or outside [facility] ED lobby doors.

## 2020-05-13 NOTE — Telephone Encounter (Signed)
Pt calling in to see update for scheduling. We still have not received clarification from transplant team if he needs stress test or not.   I called them and left a message.   In the meantime I went ahead and placed order for echo as they are back logged a few weeks for scheduling in case it needs to be adjusted.   Stress test order postponed until we can reach transplant to clarify.

## 2020-05-20 ENCOUNTER — Ambulatory Visit: Payer: Commercial Managed Care - PPO | Admitting: Physician Assistant

## 2020-05-23 NOTE — Telephone Encounter (Signed)
Spoke with patient and reviewed that we were not able to reach his transplant team. Advised that since we know he needs stress test each year order has been entered and I will have scheduling give him a call. Reviewed all instructions with him for stress test and advised to please call us if any further questions. He verbalized understanding with no further questions at this time.

## 2020-05-26 NOTE — Telephone Encounter (Signed)
Attempted to schedule no ans no vm  

## 2020-05-27 ENCOUNTER — Other Ambulatory Visit (HOSPITAL_COMMUNITY): Payer: Commercial Managed Care - PPO

## 2020-05-29 ENCOUNTER — Other Ambulatory Visit: Payer: Commercial Managed Care - PPO

## 2020-05-29 ENCOUNTER — Ambulatory Visit: Payer: Commercial Managed Care - PPO | Admitting: Family

## 2020-06-17 ENCOUNTER — Encounter
Admission: RE | Admit: 2020-06-17 | Discharge: 2020-06-17 | Disposition: A | Discharge status: Home / Self Care | Payer: Commercial Managed Care - PPO | Source: Ambulatory Visit | Attending: Cardiovascular Disease | Admitting: Cardiovascular Disease

## 2020-06-17 ENCOUNTER — Other Ambulatory Visit: Payer: Self-pay

## 2020-06-17 ENCOUNTER — Other Ambulatory Visit: Payer: Commercial Managed Care - PPO

## 2020-06-17 DIAGNOSIS — Z01818 Encounter for other preprocedural examination: Secondary | ICD-10-CM | POA: Insufficient documentation

## 2020-06-17 MED ORDER — REGADENOSON 0.4 MG/5ML IV SOLN
0.4000 mg | Freq: Once | INTRAVENOUS | Status: AC
  Administered 2020-06-17: 0.4 mg via INTRAVENOUS
  Filled 2020-06-17: qty 5

## 2020-06-17 MED ORDER — TECHNETIUM TC 99M TETROFOSMIN IV KIT
30.6000 | PACK | Freq: Once | INTRAVENOUS | Status: AC | PRN
  Administered 2020-06-17: 30.6 via INTRAVENOUS

## 2020-06-17 MED ORDER — TECHNETIUM TC 99M TETROFOSMIN IV KIT
9.2600 | PACK | Freq: Once | INTRAVENOUS | Status: AC | PRN
  Administered 2020-06-17: 9.26 via INTRAVENOUS

## 2020-06-17 NOTE — Telephone Encounter (Signed)
Pt has myoview scheduled for today.   Encounter completed.

## 2020-06-18 LAB — NM MYOCAR MULTI W/SPECT W/WALL MOTION / EF
Estimated workload: 1 METS
Exercise duration (min): 0 min
Exercise duration (sec): 0 s
MPHR: 165 {beats}/min
Peak HR: 93 {beats}/min
Percent HR: 56 %
Rest HR: 73 {beats}/min

## 2020-06-23 NOTE — Progress Notes (Deleted)
Office Visit    Patient Name: Daymian Lill Date of Encounter: 06/23/2020  Primary Care Provider:  Gaynelle Arabian, MD Primary Cardiologist:  Ida Rogue, MD Electrophysiologist:  None   Chief Complaint    Arhum Peeples is a 54 y.o. male with a hx of HTN, ESRD, ulcerative colitis, lower extremity edema, obesity presents today for ***   Past Medical History    Past Medical History:  Diagnosis Date  . Anemia    iron deficiency  . Arthritis    Gout  . CKD (chronic kidney disease) stage 3, GFR 30-59 ml/min    Stage 4  . Gout   . History of chicken pox   . Hypertension   . Melanosis coli   . Mumps   . Peripheral neuropathy   . Pneumonia   . Ulcerative colitis [place], [place]   Past Surgical History:  Procedure Laterality Date  . AV FISTULA PLACEMENT Right 08/27/2016   Procedure: RIGHT RADIOCEPHALIC ARTERIOVENOUS FISTULA CREATION;  Surgeon: [name], MD;  Location: [facility] OR;  Service: Vascular;  Laterality: Right;  . AV FISTULA PLACEMENT Right 10/21/2017   Procedure: ARTERIOVENOUS (AV) FISTULA CREATION RIGHT ARM;  Surgeon: [name], MD;  Location: [place];  Service: Vascular;  Laterality: Right;  . COLONOSCOPY    . FISTULA SUPERFICIALIZATION Right 11/12/2016   Procedure: FISTULA SUPERFICIALIZATION RIGHT FOREEARM;  Surgeon: [name], MD;  Location: [facility] OR;  Service: Vascular;  Laterality: Right;  . REVISON OF ARTERIOVENOUS FISTULA Right 12/23/2017   Procedure: CEPHALIC VEIN TRANSPOSITION ARTERIOVENOUS FISTULA RIGHT UPPER ARM;  Surgeon: [name], MD;  Location: [facility] OR;  Service: Vascular;  Laterality: Right;  . TENDON REPAIR  1987   Left index finger    Allergies  Allergies  Allergen Reactions  . Sulfa Antibiotics Shortness Of Breath and Rash    Hives, itching    History of Present Illness    Maricus Tanzi is a 54 y.o. male with a hx of f HTN, ESRD, ulcerative colitis, focal segmental glomerulosclerosis, lower extremity edema, obesity. He was last  seen 12/2018 by Dr. Rockey Situ via telemedicine.  He has undergone routine cardiac testing in the past for transplant work-up.  Echocardiogram 12/2018 LVEF 60 to 65%, LV mildly dilated, mild LVH, LV diastolic parameters consistent with impaired relaxation, RV normal size and function, LA mildly dilated, no significant valvular abnormalities.  Lexiscan Myoview 01/05/2019 low risk scan with no significant ischemia, small region of fixed defect in apical region unable to exclude old scar versus attenuation.  He was hospitalized May 2020 with Covid.  He had Lexiscan Myoview 06/17/20 with small region of ischemic in distal anterior wall, apical region on attenuation correction images with concern for overcorrection of images.  Nonattenuation correction images no significant defect.  EF 53%.  No EKG changes.  CT attenuation images with no significant coronary calcification or aortic atherosclerosis.  Low risk study.  ***  EKGs/Labs/Other Studies Reviewed:   The following studies were reviewed today:  Leane Call 06/17/20 Pharmacological myocardial perfusion imaging study with small region of ischemia in the distal anterior wall, apical region on attenuation correction images (concern for overcorrection of images) Nonattenuation correction images with no significant distal anterior wall or apical defect, there is diaphragmatic attenuation artifact Normal wall motion, EF estimated at 53% No EKG changes concerning for ischemia at peak stress or in recovery. CT attenuation correction images with no significant coronary calcification, no significant aortic atherosclerosis Low risk scan,  clinical correlation suggested, possible over processing of attenuation correction images.   If clinically indicated consider alternate imaging study   EKG:  EKG is ordered today.  The ekg ordered today demonstrates ***  Recent Labs: No results found for requested labs within last 8760 hours.  Recent Lipid Panel No  results found for: CHOL, TRIG, HDL, CHOLHDL, VLDL, LDLCALC, LDLDIRECT  Home Medications   No outpatient medications have been marked as taking for the 06/24/20 encounter (Appointment) with Loel Dubonnet, NP.      Review of Systems    ***   ROS All other systems reviewed and are otherwise negative except as noted above.  Physical Exam    VS:  There were no vitals taken for this visit. , BMI There is no height or weight on file to calculate BMI. GEN: Well nourished, well developed, in no acute distress. HEENT: normal. Neck: Supple, no JVD, carotid bruits, or masses. Cardiac: ***RRR, no murmurs, rubs, or gallops. No clubbing, cyanosis, edema.  ***Radials/DP/PT 2+ and equal bilaterally.  Respiratory:  ***Respirations regular and unlabored, clear to auscultation bilaterally. GI: Soft, nontender, nondistended, BS + x 4. MS: No deformity or atrophy. Skin: Warm and dry, no rash. Neuro:  Strength and sensation are intact. Psych: Normal affect.  Assessment & Plan    1. HTN -  2. ESRD -  3. Obesity -  4. LE edema -   Disposition: Follow up {follow up:15908} with Dr. Rockey Situ or APP   Loel Dubonnet, NP 06/23/2020, 7:50 PM

## 2020-06-24 ENCOUNTER — Ambulatory Visit: Payer: Commercial Managed Care - PPO | Admitting: Family

## 2020-06-25 ENCOUNTER — Encounter: Payer: Self-pay | Admitting: Family

## 2020-08-12 ENCOUNTER — Telehealth: Payer: Self-pay | Admitting: Cardiovascular Disease

## 2020-08-12 NOTE — Telephone Encounter (Signed)
LVM for patient to reschedule.

## 2020-08-12 NOTE — Telephone Encounter (Signed)
Records received from [facility] and [place] (Dr. Marisue Humble). These records were faxed in September for appt on 06/24/20, which patient no-showed. Pt had low risk stress test on 06/17/20.   Routing to Scheduling to schedule overdue 6 month follow-up appointment.

## 2020-09-01 ENCOUNTER — Ambulatory Visit: Payer: Commercial Managed Care - PPO | Admitting: Family

## 2020-09-04 ENCOUNTER — Other Ambulatory Visit: Payer: Self-pay

## 2020-09-04 ENCOUNTER — Encounter (HOSPITAL_COMMUNITY): Payer: Self-pay | Admitting: Emergency Medicine

## 2020-09-04 ENCOUNTER — Emergency Department (HOSPITAL_COMMUNITY)
Admission: EM | Admit: 2020-09-04 | Discharge: 2020-09-05 | Disposition: A | Discharge status: Home / Self Care | Payer: Commercial Managed Care - PPO | Attending: Emergency Medicine | Admitting: Emergency Medicine

## 2020-09-04 DIAGNOSIS — R519 Headache, unspecified: Secondary | ICD-10-CM | POA: Insufficient documentation

## 2020-09-04 DIAGNOSIS — Z992 Dependence on renal dialysis: Secondary | ICD-10-CM | POA: Insufficient documentation

## 2020-09-04 DIAGNOSIS — I120 Hypertensive chronic kidney disease with stage 5 chronic kidney disease or end stage renal disease: Secondary | ICD-10-CM | POA: Diagnosis not present

## 2020-09-04 DIAGNOSIS — R42 Dizziness and giddiness: Secondary | ICD-10-CM | POA: Diagnosis present

## 2020-09-04 DIAGNOSIS — E861 Hypovolemia: Secondary | ICD-10-CM | POA: Diagnosis not present

## 2020-09-04 DIAGNOSIS — N186 End stage renal disease: Secondary | ICD-10-CM | POA: Insufficient documentation

## 2020-09-04 LAB — COMPREHENSIVE METABOLIC PANEL
ALT: 24 U/L (ref 0–44)
AST: 25 U/L (ref 15–41)
Albumin: 4 g/dL (ref 3.5–5.0)
Alkaline Phosphatase: 40 U/L (ref 38–126)
Anion gap: 14 (ref 5–15)
BUN: 34 mg/dL — ABNORMAL HIGH (ref 6–20)
CO2: 29 mmol/L (ref 22–32)
Calcium: 9.5 mg/dL (ref 8.9–10.3)
Chloride: 88 mmol/L — ABNORMAL LOW (ref 98–111)
Creatinine, Ser: 8.79 mg/dL — ABNORMAL HIGH (ref 0.61–1.24)
GFR, Estimated: 7 mL/min — ABNORMAL LOW (ref >60)
Glucose, Bld: 137 mg/dL — ABNORMAL HIGH (ref 70–99)
Potassium: 3.4 mmol/L — ABNORMAL LOW (ref 3.5–5.1)
Sodium: 131 mmol/L — ABNORMAL LOW (ref 135–145)
Total Bilirubin: 0.6 mg/dL (ref 0.3–1.2)
Total Protein: 8.4 g/dL — ABNORMAL HIGH (ref 6.5–8.1)

## 2020-09-04 LAB — I-STAT VENOUS BLOOD GAS, ED
Acid-Base Excess: 8 mmol/L — ABNORMAL HIGH (ref 0.0–2.0)
Bicarbonate: 32.4 mmol/L — ABNORMAL HIGH (ref 20.0–28.0)
Calcium, Ion: 1.07 mmol/L — ABNORMAL LOW (ref 1.15–1.40)
HCT: 31 % — ABNORMAL LOW (ref 39.0–52.0)
Hemoglobin: 10.5 g/dL — ABNORMAL LOW (ref 13.0–17.0)
O2 Saturation: 58 %
Potassium: 3.3 mmol/L — ABNORMAL LOW (ref 3.5–5.1)
Sodium: 132 mmol/L — ABNORMAL LOW (ref 135–145)
TCO2: 34 mmol/L — ABNORMAL HIGH (ref 22–32)
pCO2, Ven: 44.8 mmHg (ref 44.0–60.0)
pH, Ven: 7.467 — ABNORMAL HIGH (ref 7.250–7.430)
pO2, Ven: 29 mmHg — CL (ref 32.0–45.0)

## 2020-09-04 LAB — CBC
HCT: 29.3 % — ABNORMAL LOW (ref 39.0–52.0)
Hemoglobin: 10 g/dL — ABNORMAL LOW (ref 13.0–17.0)
MCH: 36.4 pg — ABNORMAL HIGH (ref 26.0–34.0)
MCHC: 34.1 g/dL (ref 30.0–36.0)
MCV: 106.5 fL — ABNORMAL HIGH (ref 80.0–100.0)
Platelets: 129 10*3/uL — ABNORMAL LOW (ref 150–400)
RBC: 2.75 MIL/uL — ABNORMAL LOW (ref 4.22–5.81)
RDW: 14.7 % (ref 11.5–15.5)
WBC: 10.8 10*3/uL — ABNORMAL HIGH (ref 4.0–10.5)
nRBC: 0 % (ref 0.0–0.2)

## 2020-09-04 MED ORDER — SODIUM CHLORIDE 0.9 % IV BOLUS
1000.0000 mL | Freq: Once | INTRAVENOUS | Status: AC
  Administered 2020-09-04: 1000 mL via INTRAVENOUS

## 2020-09-04 MED ORDER — ONDANSETRON HCL 4 MG/2ML IJ SOLN
4.0000 mg | Freq: Once | INTRAMUSCULAR | Status: AC
  Administered 2020-09-04: 4 mg via INTRAVENOUS
  Filled 2020-09-04: qty 2

## 2020-09-04 NOTE — ED Provider Notes (Signed)
[facility] Emergency Department Provider Note MRN:  [id number]  Arrival date & time: 09/05/20     Chief Complaint   Dizziness   History of Present Illness   Colin [name] is a 54 y.o. year-old male with a history of ESRD, ulcerative colitis presenting to the ED with chief complaint of dizziness.  Patient explains that he recently started on hemodialysis, headache session today, unsure how much fluid was taken off.  Afterwards feeling very dizzy, nauseated.  Dizziness described as a lightheadedness.  Denies any room spinning, no chest pain or shortness of breath, abdominal pain, no numbness or weakness to the arms or legs.  Feeling generally weak and unwell.  Symptoms are moderate, constant, no exacerbating relieving factors.  Review of Systems  A complete 10 system review of systems was obtained and all systems are negative except as noted in the HPI and PMH.   Patient's Health History    Past Medical History:  Diagnosis Date  . Anemia    iron deficiency  . Arthritis    Gout  . CKD (chronic kidney disease) stage 3, GFR 30-59 ml/min (HCC)    Stage 4  . Gout   . History of chicken pox   . Hypertension   . Melanosis coli   . Mumps   . Peripheral neuropathy   . Pneumonia   . Ulcerative colitis [place], [place]    Past Surgical History:  Procedure Laterality Date  . AV FISTULA PLACEMENT Right 08/27/2016   Procedure: RIGHT RADIOCEPHALIC ARTERIOVENOUS FISTULA CREATION;  Surgeon: [name], MD;  Location: [facility] OR;  Service: Vascular;  Laterality: Right;  . AV FISTULA PLACEMENT Right 10/21/2017   Procedure: ARTERIOVENOUS (AV) FISTULA CREATION RIGHT ARM;  Surgeon: [name], MD;  Location: [place];  Service: Vascular;  Laterality: Right;  . COLONOSCOPY    . FISTULA SUPERFICIALIZATION Right 11/12/2016   Procedure: FISTULA SUPERFICIALIZATION RIGHT FOREEARM;  Surgeon: [name], MD;  Location: [facility] OR;  Service: Vascular;  Laterality: Right;  .  REVISON OF ARTERIOVENOUS FISTULA Right 12/23/2017   Procedure: CEPHALIC VEIN TRANSPOSITION ARTERIOVENOUS FISTULA RIGHT UPPER ARM;  Surgeon: [name], MD;  Location: [facility] OR;  Service: Vascular;  Laterality: Right;  . TENDON REPAIR  1987   Left index finger    Family History  Problem Relation Age of Onset  . Hypertension Father   . Heart disease Father   . Renal Disease Father   . Hypertension Mother     Social History   Socioeconomic History  . Marital status: Married    Spouse name: Not on file  . Number of children: Not on file  . Years of education: Not on file  . Highest education level: Not on file  Occupational History  . Not on file  Tobacco Use  . Smoking status: Never Smoker  . Smokeless tobacco: Former Systems developer    Types: Secondary school teacher  . Vaping Use: Never used  Substance and Sexual Activity  . Alcohol use: Yes    Comment: rare  . Drug use: No  . Sexual activity: Not on file  Other Topics Concern  . Not on file  Social History Narrative  . Not on file   Social Determinants of Health   Financial Resource Strain:   . Difficulty of Paying Living Expenses: Not on file  Food Insecurity:   . Worried About Charity fundraiser in the Last Year: Not on file  .  Ran Out of Food in the Last Year: Not on file  Transportation Needs:   . Lack of Transportation (Medical): Not on file  . Lack of Transportation (Non-Medical): Not on file  Physical Activity:   . Days of Exercise per Week: Not on file  . Minutes of Exercise per Session: Not on file  Stress:   . Feeling of Stress : Not on file  Social Connections:   . Frequency of Communication with Friends and Family: Not on file  . Frequency of Social Gatherings with Friends and Family: Not on file  . Attends Religious Services: Not on file  . Active Member of Clubs or Organizations: Not on file  . Attends Archivist Meetings: Not on file  . Marital Status: Not on file  Intimate Partner Violence:   . Fear  of Current or Ex-Partner: Not on file  . Emotionally Abused: Not on file  . Physically Abused: Not on file  . Sexually Abused: Not on file     Physical Exam   Vitals:   09/04/20 2330 09/04/20 2345  BP: 119/83 124/79  Pulse: 76 87  Resp: 20 20  Temp:    SpO2: 100% 99%    CONSTITUTIONAL: Well-appearing, NAD NEURO:  Alert and oriented x 3, no focal deficits EYES:  eyes equal and reactive ENT/NECK:  no LAD, no JVD CARDIO: Regular rate, well-perfused, normal S1 and S2 PULM:  CTAB no wheezing or rhonchi GI/GU:  normal bowel sounds, non-distended, non-tender MSK/SPINE:  No gross deformities, no edema SKIN:  no rash, atraumatic PSYCH:  Appropriate speech and behavior  *Additional and/or pertinent findings included in MDM below  Diagnostic and Interventional Summary    EKG Interpretation  Date/Time:  Thursday September 04 2020 21:50:05 EST Ventricular Rate:  77 PR Interval:    QRS Duration: 94 QT Interval:  430 QTC Calculation: 487 R Axis:   46 Text Interpretation: Sinus rhythm Borderline prolonged QT interval Confirmed by Gerlene Fee [phone]) on 09/04/2020 10:20:12 PM      Labs Reviewed  CBC - Abnormal; Notable for the following components:      Result Value   WBC 10.8 (*)    RBC 2.75 (*)    Hemoglobin 10.0 (*)    HCT 29.3 (*)    MCV 106.5 (*)    MCH 36.4 (*)    Platelets 129 (*)    All other components within normal limits  COMPREHENSIVE METABOLIC PANEL - Abnormal; Notable for the following components:   Sodium 131 (*)    Potassium 3.4 (*)    Chloride 88 (*)    Glucose, Bld 137 (*)    BUN 34 (*)    Creatinine, Ser 8.79 (*)    Total Protein 8.4 (*)    GFR, Estimated 7 (*)    All other components within normal limits  I-STAT VENOUS BLOOD GAS, ED - Abnormal; Notable for the following components:   pH, Ven 7.467 (*)    pO2, Ven 29.0 (*)    Bicarbonate 32.4 (*)    TCO2 34 (*)    Acid-Base Excess 8.0 (*)    Sodium 132 (*)    Potassium 3.3 (*)    Calcium,  Ion 1.07 (*)    HCT 31.0 (*)    Hemoglobin 10.5 (*)    All other components within normal limits  CULTURE, BLOOD (SINGLE)    No orders to display    Medications  ondansetron (ZOFRAN) injection 4 mg (4 mg Intravenous Given 09/04/20 2252)  sodium chloride 0.9 % bolus 1,000 mL (1,000 mLs Intravenous New Bag/Given 09/04/20 2255)     Procedures  /  Critical Care Procedures  ED Course and Medical Decision Making  I have reviewed the triage vital signs, the nursing notes, and pertinent available records from the EMR.  Listed above are laboratory and imaging tests that I personally ordered, reviewed, and interpreted and then considered in my medical decision making (see below for details).  Soft blood pressure, malaise, lightheadedness, nausea after dialysis session.  New to home dialysis.  Suspect he is volume down.  No fever, no infectious symptoms.  Providing fluids, nausea medicine, checking labs, will reassess.  Anticipating discharge.     Work-up is reassuring, patient feeling much better after IV fluids and Zofran.  Vital signs normal, blood pressure improved.  No fever, appropriate for discharge with close nephrology follow-up.  Barth Kirks. Sedonia Small, [place] [email or web address]  Final Clinical Impressions(s) / ED Diagnoses     ICD-10-CM   1. Hypovolemia  E86.1     ED Discharge Orders    None       Discharge Instructions Discussed with and Provided to Patient:     Discharge Instructions     You were evaluated in the Emergency Department and after careful evaluation, we did not find any emergent condition requiring admission or further testing in the hospital.  Your exam/testing today is overall reassuring.  Your symptoms seem to be due to dehydration, it is possible that too much fluid was taken off during your dialysis at home.  Please follow-up with your nephrologist as soon as possible to discuss this emergency  department visit.  Please return to the Emergency Department if you experience any worsening of your condition.   Thank you for allowing [place] to be a part of your care.       Maudie Flakes, MD 09/05/20 [phone]

## 2020-09-04 NOTE — ED Triage Notes (Signed)
Patient from home and was doing a home hemodialysis treatment, became nauseated, dizzy, diaphoretic.  Patient became hypertensive en route to ED.  Patient's patient did come down.  Unknown home much fluid patient had taken off during the treatment.  HR 80, 122/78, CBG 137.

## 2020-09-05 ENCOUNTER — Ambulatory Visit: Payer: Commercial Managed Care - PPO | Admitting: Family

## 2020-09-05 NOTE — Discharge Instructions (Addendum)
You were evaluated in the Emergency Department and after careful evaluation, we did not find any emergent condition requiring admission or further testing in the hospital.  Your exam/testing today is overall reassuring.  Your symptoms seem to be due to dehydration, it is possible that too much fluid was taken off during your dialysis at home.  Please follow-up with your nephrologist as soon as possible to discuss this emergency department visit.  Please return to the Emergency Department if you experience any worsening of your condition.   Thank you for allowing [place] to be a part of your care.

## 2020-09-09 LAB — CULTURE, BLOOD (SINGLE)
Culture: NO GROWTH
Special Requests: ADEQUATE

## 2020-09-10 ENCOUNTER — Ambulatory Visit: Payer: Commercial Managed Care - PPO | Admitting: Family

## 2020-09-16 ENCOUNTER — Ambulatory Visit: Payer: Commercial Managed Care - PPO | Admitting: Family

## 2020-09-21 NOTE — Progress Notes (Signed)
Cardiology Office Note  Date:  09/21/2020   ID:  Colin [name], DOB 13-May-1966, MRN [id number]  PCP:  Gaynelle Arabian, MD  Cardiologist:  Dr. Rockey Situ  _____________  Follow-up echo and Myoview/12 month f/u  _____________   History of Present Illness: Colin [name] is a 54 y.o. male with pmh of HTN, ESRD on HD, ulcertaive colitis, chronic leg edema who is being seen for annual follow-up (overdue). He was last seen 2018 and was stable from a cardiac perspective and no changes were made. He had a lexiscan myoview 06/2020 that showed normal wall motion, EF 53%, low risk, small region of ischemia in the distal anterior wall, apical attenuation correction. Echo in August at outside office showed EF 55-60%, G1DD, mod concentric LVH, no significant valvular disease.   Today, he reports generalized fatigue and shortness of breath. This has been going on for the last month. Feels generally very tired and out of energy. He gets sob walking up stairs and has to rest. Denies chest pain. No BRBPR or dark stools. Appetite is normal. [facility] his [facility] was low and said he might need injections. He started home dialysis about 3 weeks ago. He does it 4 days a week- M, T, TH, Fr. Bps during dialysis are normally stable 109-116/70s. He was seen in the ED 11/18 for dizziness suspected secondary to hypovolemia. He feels fatigue and sob are different than the ER visit. He denies symptoms of palpitations, chest pain, orthopnea, PND, lower extremity edema, claudication, dizziness, presyncope, syncope, bleeding, or neurologic sequela. The patient is tolerating medications without difficulties and is otherwise without complaint today.   _____________   Past Medical History:  Diagnosis Date  . Anemia    iron deficiency  . Arthritis    Gout  . CKD (chronic kidney disease) stage 3, GFR 30-59 ml/min (HCC)    Stage 4  . Gout   . History of chicken pox   . Hypertension   .  Melanosis coli   . Mumps   . Peripheral neuropathy   . Pneumonia   . Ulcerative colitis [place], [place]   Past Surgical History:  Procedure Laterality Date  . AV FISTULA PLACEMENT Right 08/27/2016   Procedure: RIGHT RADIOCEPHALIC ARTERIOVENOUS FISTULA CREATION;  Surgeon: [name], MD;  [facility] OR;  Service: Vascular;  Laterality: Right;  . AV FISTULA PLACEMENT Right 10/21/2017   Procedure: ARTERIOVENOUS (AV) FISTULA CREATION RIGHT ARM;  Surgeon: [name], MD;  Location: [place];  Service: Vascular;  Laterality: Right;  . COLONOSCOPY    . FISTULA SUPERFICIALIZATION Right 11/12/2016   Procedure: FISTULA SUPERFICIALIZATION RIGHT FOREEARM;  Surgeon: [name], MD;  Location: [facility] OR;  Service: Vascular;  Laterality: Right;  . REVISON OF ARTERIOVENOUS FISTULA Right 12/23/2017   Procedure: CEPHALIC VEIN TRANSPOSITION ARTERIOVENOUS FISTULA RIGHT UPPER ARM;  Surgeon: [name], MD;  Location: [facility] OR;  Service: Vascular;  Laterality: Right;  . TENDON REPAIR  1987   Left index finger   _____________  Current Outpatient Medications  Medication Sig Dispense Refill  . acetaminophen (TYLENOL) 500 MG tablet Take 500 mg by mouth every 6 (six) hours as needed for mild pain or fever.     Marland Kitchen allopurinol (ZYLOPRIM) 100 MG tablet Take 150 mg by mouth daily.    Marland Kitchen amitriptyline (ELAVIL) 25 MG tablet amitriptyline 25 mg tablet  TAKE 3 TABLETS BY MOUTH AT BEDTIME    .  atenolol (TENORMIN) 50 MG tablet Take 50 mg by mouth daily.    . calcitRIOL (ROCALTROL) 0.5 MCG capsule Take by mouth.    . colchicine 0.6 MG tablet Take 1 tablet (0.6 mg total) by mouth daily. 10 tablet 0  . diclofenac Sodium (VOLTAREN) 1 % GEL APPLY 2 GRAMS TO THE AFFECTED AREA(S) BY TOPICAL ROUTE 4 TIMES PER DAY    . ferrous sulfate 325 (65 FE) MG tablet Take 325 mg by mouth at bedtime.    Marland Kitchen FLUoxetine (PROZAC) 10 MG capsule Take 10 mg by mouth daily.    Marland Kitchen gabapentin (NEURONTIN) 100 MG capsule Take by mouth.    Marland Kitchen  HYDROcodone-acetaminophen (NORCO/VICODIN) 5-325 MG tablet Take 1 tablet by mouth every 4 (four) hours as needed for moderate pain or severe pain. 10 tablet 0  . Methoxy PEG-Epoetin Beta (MIRCERA IJ) Mircera    . multivitamin (RENA-VIT) TABS tablet Take 1 tablet by mouth at bedtime.  10  . mupirocin ointment (BACTROBAN) 2 % Apply topically.    Marland Kitchen neomycin-polymyxin-hydrocortisone (CORTISPORIN) 3.5-10000-1 OTIC suspension Place 4 drops into the left ear 3 (three) times daily. 10 mL 0  . ondansetron (ZOFRAN ODT) 4 MG disintegrating tablet Take 1 tablet (4 mg total) by mouth every 8 (eight) hours as needed for nausea or vomiting. 8 tablet 0  . PEPCID 20 MG tablet Take 20 mg by mouth daily.    . pravastatin (PRAVACHOL) 40 MG tablet Take 40 mg by mouth at bedtime.     . sevelamer carbonate (RENVELA) 800 MG tablet Take 2,400 mg by mouth 3 (three) times daily with meals.     . traMADol (ULTRAM) 50 MG tablet Take 1 tablet (50 mg total) by mouth every 6 (six) hours as needed for moderate pain or severe pain. 15 tablet 0  . VELPHORO 500 MG chewable tablet Chew by mouth.     No current facility-administered medications for this visit.   _____________   Allergies:   Sulfa antibiotics  _____________   Social History:  The patient  reports that he has never smoked. He has quit using smokeless tobacco.  His smokeless tobacco use included chew. He reports current alcohol use. He reports that he does not use drugs.  _____________   Family History:  The patient's family history includes Heart disease in his father; Hypertension in his father and mother; Renal Disease in his father.  _____________   ROS:  Please see the history of present illness.   Positive for generalized fatigue and doe,   All other systems are reviewed and negative.  _____________   PHYSICAL EXAM: VS:  There were no vitals taken for this visit. , BMI There is no height or weight on file to calculate BMI. GEN: Well nourished, well  developed, in no acute distress  HEENT: normal  Neck: no JVD, carotid bruits, or masses Cardiac: RRR; no murmurs, rubs, or gallops. No clubbing, cyanosis, edema.  Radials/DP/PT 2+ and equal bilaterally.  Respiratory:  clear to auscultation bilaterally, normal work of breathing GI: soft, nontender, nondistended, + BS MS: no deformity or atrophy  Skin: warm and dry, no rash Neuro:  Strength and sensation are intact Psych: euthymic mood, full affect _____________  EKG:   The ekg ordered today shows NSR, 72bpm, nonspecific T wave changes III  Recent Labs: 09/04/2020: ALT 24; BUN 34; Creatinine, Ser 8.79; Hemoglobin 10.5; Platelets 129; Potassium 3.3; Sodium 132  No results found for requested labs within last 8760 hours.  CrCl cannot be  calculated (Unknown ideal weight.).  Wt Readings from Last 3 Encounters:  05/10/20 (!) 227 lb 1.2 oz (103 kg)  03/23/20 220 lb (99.8 kg)  03/19/19 221 lb 12.5 oz (100.6 kg)    Myoview lexiscan 2021   Pharmacological myocardial perfusion imaging study with small region of ischemia in the distal anterior wall, apical region on attenuation correction images (concern for overcorrection of images) Nonattenuation correction images with no significant distal anterior wall or apical defect, there is diaphragmatic attenuation artifact Normal wall motion, EF estimated at 53% No EKG changes concerning for ischemia at peak stress or in recovery. CT attenuation correction images with no significant coronary calcification, no significant aortic atherosclerosis Low risk scan, clinical correlation suggested, possible over processing of attenuation correction images.   If clinically indicated consider alternate imaging study  Signed, Esmond Plants, MD, Ph.D [facility]     Echo 2020 1. The left ventricle has normal systolic function with an ejection  fraction of 60-65%. The cavity size was mildly dilated. There is mildly  increased left ventricular wall  thickness. Left ventricular diastolic  Doppler parameters are consistent with  impaired relaxation.  2. The right ventricle has normal systolic function. The cavity was  normal. There is no increase in right ventricular wall thickness. Unable  to estimate RVSP  3. Left atrial size was mildly dilated.  _____________   ASSESSMENT AND PLAN:  Fatigue/sob Unclear etiology. [facility], which [facility] will [facility] on. Also started home HD 4 days a week but feels symptoms started before this. Denies bleeding issues. No chest pain. No hx of CAD. Myoview in 06/2020 was low risk, normal EF with small region of ischemia in the distal anterior wall, apical attenuation correction. Echo at outside office showed EF 55-60%, G1DD, mod concentric LVH, no significant valvular disease. EKG today stable. Will check TSH, CBC, BMET. Patient will further follow-up with PCP/[facility].   HTN Bps stable during home dialysis. BP today 120/88. Continue atenolol.   HLD Continue pravastatin. Followed by PCP.  ESRD on HD Recently started on HD. Was seen in the ER for dizziness susepcted secodnary to hypovolemia. Labs showed mild hypokalemia. He was given IVF bolus.    Disposition:   FU in 1 year   Signed, Twanisha Foulk Ninfa Meeker, PA-C 09/21/2020 9:41 PM    _____________ [facility] [address] [place] [address]  [phone] (office) [phone] (fax)

## 2020-09-22 ENCOUNTER — Encounter: Payer: Self-pay | Admitting: Medical

## 2020-09-22 ENCOUNTER — Other Ambulatory Visit
Admission: RE | Admit: 2020-09-22 | Discharge: 2020-09-22 | Disposition: A | Discharge status: Home / Self Care | Payer: Commercial Managed Care - PPO | Source: Ambulatory Visit | Attending: Family | Admitting: Family

## 2020-09-22 ENCOUNTER — Other Ambulatory Visit: Payer: Self-pay

## 2020-09-22 ENCOUNTER — Ambulatory Visit (INDEPENDENT_AMBULATORY_CARE_PROVIDER_SITE_OTHER): Payer: Commercial Managed Care - PPO | Admitting: Medical

## 2020-09-22 VITALS — BP 120/88 | HR 72 | Ht 65.0 in | Wt 240.0 lb

## 2020-09-22 DIAGNOSIS — E782 Mixed hyperlipidemia: Secondary | ICD-10-CM

## 2020-09-22 DIAGNOSIS — N186 End stage renal disease: Secondary | ICD-10-CM | POA: Diagnosis not present

## 2020-09-22 DIAGNOSIS — R5383 Other fatigue: Secondary | ICD-10-CM | POA: Diagnosis not present

## 2020-09-22 DIAGNOSIS — I10 Essential (primary) hypertension: Secondary | ICD-10-CM | POA: Insufficient documentation

## 2020-09-22 LAB — CBC
HCT: 23.8 % — ABNORMAL LOW (ref 39.0–52.0)
Hemoglobin: 8.3 g/dL — ABNORMAL LOW (ref 13.0–17.0)
MCH: 37.1 pg — ABNORMAL HIGH (ref 26.0–34.0)
MCHC: 34.9 g/dL (ref 30.0–36.0)
MCV: 106.3 fL — ABNORMAL HIGH (ref 80.0–100.0)
Platelets: 249 10*3/uL (ref 150–400)
RBC: 2.24 MIL/uL — ABNORMAL LOW (ref 4.22–5.81)
RDW: 13.8 % (ref 11.5–15.5)
WBC: 7.7 10*3/uL (ref 4.0–10.5)
nRBC: 0 % (ref 0.0–0.2)

## 2020-09-22 LAB — BASIC METABOLIC PANEL
Anion gap: 16 — ABNORMAL HIGH (ref 5–15)
BUN: 74 mg/dL — ABNORMAL HIGH (ref 6–20)
CO2: 24 mmol/L (ref 22–32)
Calcium: 9.4 mg/dL (ref 8.9–10.3)
Chloride: 96 mmol/L — ABNORMAL LOW (ref 98–111)
Creatinine, Ser: 13.87 mg/dL — ABNORMAL HIGH (ref 0.61–1.24)
GFR, Estimated: 4 mL/min — ABNORMAL LOW (ref >60)
Glucose, Bld: 93 mg/dL (ref 70–99)
Potassium: 4.7 mmol/L (ref 3.5–5.1)
Sodium: 136 mmol/L (ref 135–145)

## 2020-09-22 LAB — TSH: TSH: 2.376 u[IU]/mL (ref 0.350–4.500)

## 2020-09-22 NOTE — Patient Instructions (Addendum)
Medication Instructions:  No medication changes today.  *If you need a refill on your cardiac medications before your next appointment, please call your pharmacy*  Lab Work: Your provider recommends  lab work today: BMP, CBC, TSH  If you have labs (blood work) drawn today and your tests are completely normal, you will receive your results only by: Marland Kitchen MyChart Message (if you have MyChart) OR . A paper copy in the mail If you have any lab test that is abnormal or we need to change your treatment, we will call you to review the results.  Testing/Procedures: Your EKG today showed normal sinus rhythm. This is a good result!  Follow-Up: At [facility], you and your health needs are our priority.  As part of our continuing mission to provide you with exceptional heart care, we have created designated Provider Care Teams.  These Care Teams include your primary Cardiologist (physician) and Advanced Practice Providers (APPs -  Physician Assistants and Nurse Practitioners) who all work together to provide you with the care you need, when you need it.  We recommend signing up for the patient portal called "MyChart".  Sign up information is provided on this After Visit Summary.  MyChart is used to connect with patients for Virtual Visits (Telemedicine).  Patients are able to view lab/test results, encounter notes, upcoming appointments, etc.  Non-urgent messages can be sent to your provider as well.   To learn more about what you can do with MyChart, go to [email or web address].    Your next appointment:   1 year(s)  The format for your next appointment:   In Person  Provider:   You may see Ida Rogue, MD or one of the following Advanced Practice Providers on your designated Care Team:    Murray Hodgkins, NP  Christell Faith, PA-C  Marrianne Mood, PA-C  Cadence [place], [place]  Laurann [place], NP

## 2020-09-23 ENCOUNTER — Telehealth: Payer: Self-pay | Admitting: Family

## 2020-09-23 NOTE — Telephone Encounter (Signed)
The patient has been notified of the result and verbalized understanding.  All questions (if any) were answered. He has appointment with his nephrologist tomorrow and he will be sure to let him know. Also, link sent to patient's email to set up MyChart.

## 2020-09-23 NOTE — Telephone Encounter (Signed)
Patient returning call .  Unavailable 3-4 today for call back

## 2020-09-23 NOTE — Telephone Encounter (Signed)
Patient is returning the call

## 2020-09-23 NOTE — Telephone Encounter (Signed)
Attempted to call the patient. He answered and advised he will need to call back as he is on a conference call.   No results given to the patient.

## 2020-09-23 NOTE — Telephone Encounter (Signed)
Loel Dubonnet, NP  09/22/2020 5:05 PM EST     Hb has dropped from 10.5 two weeks ago to 8.3 today. Anemia of CKD is likely driving his fatigue. I have forwarded to his PCP as FYI. He will also need to provide [place] the name of his nephrologist so we can forward lab as he will likely need IV iron and erythropoietin with his HD. Will defer management to PCP/nephrology.   Stable renal function. Normal electrolytes.

## 2020-10-20 ENCOUNTER — Other Ambulatory Visit: Payer: Self-pay

## 2020-10-20 ENCOUNTER — Ambulatory Visit (INDEPENDENT_AMBULATORY_CARE_PROVIDER_SITE_OTHER): Payer: Commercial Managed Care - PPO | Admitting: Podiatry

## 2020-10-20 DIAGNOSIS — M2141 Flat foot [pes planus] (acquired), right foot: Secondary | ICD-10-CM

## 2020-10-20 DIAGNOSIS — M2142 Flat foot [pes planus] (acquired), left foot: Secondary | ICD-10-CM

## 2020-10-20 NOTE — Progress Notes (Signed)
   Subjective:  55 y.o. male presenting today as a reestablish new patient for evaluation of fatigue and achiness especially at the end of the day.  Aggravated by periods of long standing and walking.  Patient states that his feet do not feel right by the end of the day.  He presents for further treatment evaluation  Past Medical History:  Diagnosis Date  . Anemia    iron deficiency  . Arthritis    Gout  . CKD (chronic kidney disease) stage 3, GFR 30-59 ml/min (HCC)    Stage 4  . Gout   . History of chicken pox   . Hypertension   . Melanosis coli   . Mumps   . Peripheral neuropathy   . Pneumonia   . Ulcerative colitis [place], [place]    Objective/Physical Exam General: The patient is alert and oriented x3 in no acute distress.  Dermatology: Skin is warm, dry and supple bilateral lower extremities. Negative for open lesions or macerations.  Vascular: Palpable pedal pulses bilaterally. No edema or erythema noted. Capillary refill within normal limits.  Neurological: Epicritic and protective threshold grossly intact bilaterally.   Musculoskeletal Exam: Range of motion within normal limits to all pedal and ankle joints bilateral. Muscle strength 5/5 in all groups bilateral.  Upon weightbearing there is a medial longitudinal arch collapse bilaterally. Remove foot valgus noted to the bilateral lower extremities with excessive pronation upon mid stance.  Assessment: 1. pes planus bilateral   Plan of Care:  1. Patient was evaluated.  2.  Appointment with Pedorthist for custom molded orthotics 3.  Recommend good supportive shoes 4.  Return to clinic as needed   Edrick Kins, DPM [facility]  Dr. Edrick Kins, DPM    [address], [place] [address]                Office [phone]  Fax [phone]

## 2020-11-06 ENCOUNTER — Other Ambulatory Visit: Payer: Commercial Managed Care - PPO | Admitting: Orthotics

## 2020-11-18 ENCOUNTER — Encounter (HOSPITAL_COMMUNITY): Payer: Self-pay | Admitting: Emergency Medicine

## 2020-11-18 ENCOUNTER — Emergency Department (HOSPITAL_COMMUNITY)
Admission: EM | Admit: 2020-11-18 | Discharge: 2020-11-19 | Disposition: A | Discharge status: Home / Self Care | Payer: Commercial Managed Care - PPO | Attending: Emergency Medicine | Admitting: Emergency Medicine

## 2020-11-18 ENCOUNTER — Other Ambulatory Visit: Payer: Self-pay

## 2020-11-18 DIAGNOSIS — Z79899 Other long term (current) drug therapy: Secondary | ICD-10-CM | POA: Insufficient documentation

## 2020-11-18 DIAGNOSIS — E875 Hyperkalemia: Secondary | ICD-10-CM | POA: Diagnosis not present

## 2020-11-18 DIAGNOSIS — N186 End stage renal disease: Secondary | ICD-10-CM | POA: Insufficient documentation

## 2020-11-18 DIAGNOSIS — F17290 Nicotine dependence, other tobacco product, uncomplicated: Secondary | ICD-10-CM | POA: Insufficient documentation

## 2020-11-18 DIAGNOSIS — R109 Unspecified abdominal pain: Secondary | ICD-10-CM | POA: Diagnosis not present

## 2020-11-18 DIAGNOSIS — Z992 Dependence on renal dialysis: Secondary | ICD-10-CM | POA: Diagnosis not present

## 2020-11-18 DIAGNOSIS — I120 Hypertensive chronic kidney disease with stage 5 chronic kidney disease or end stage renal disease: Secondary | ICD-10-CM | POA: Insufficient documentation

## 2020-11-18 LAB — CBC
HCT: 26.9 % — ABNORMAL LOW (ref 39.0–52.0)
Hemoglobin: 8.7 g/dL — ABNORMAL LOW (ref 13.0–17.0)
MCH: 36.4 pg — ABNORMAL HIGH (ref 26.0–34.0)
MCHC: 32.3 g/dL (ref 30.0–36.0)
MCV: 112.6 fL — ABNORMAL HIGH (ref 80.0–100.0)
Platelets: 238 10*3/uL (ref 150–400)
RBC: 2.39 MIL/uL — ABNORMAL LOW (ref 4.22–5.81)
RDW: 14.4 % (ref 11.5–15.5)
WBC: 9.6 10*3/uL (ref 4.0–10.5)
nRBC: 0 % (ref 0.0–0.2)

## 2020-11-18 LAB — COMPREHENSIVE METABOLIC PANEL
ALT: 18 U/L (ref 0–44)
AST: 11 U/L — ABNORMAL LOW (ref 15–41)
Albumin: 4.1 g/dL (ref 3.5–5.0)
Alkaline Phosphatase: 28 U/L — ABNORMAL LOW (ref 38–126)
Anion gap: 18 — ABNORMAL HIGH (ref 5–15)
BUN: 89 mg/dL — ABNORMAL HIGH (ref 6–20)
CO2: 22 mmol/L (ref 22–32)
Calcium: 8.7 mg/dL — ABNORMAL LOW (ref 8.9–10.3)
Chloride: 93 mmol/L — ABNORMAL LOW (ref 98–111)
Creatinine, Ser: 18.94 mg/dL — ABNORMAL HIGH (ref 0.61–1.24)
GFR, Estimated: 3 mL/min — ABNORMAL LOW (ref >60)
Glucose, Bld: 85 mg/dL (ref 70–99)
Potassium: 6.1 mmol/L — ABNORMAL HIGH (ref 3.5–5.1)
Sodium: 133 mmol/L — ABNORMAL LOW (ref 135–145)
Total Bilirubin: 0.7 mg/dL (ref 0.3–1.2)
Total Protein: 8.2 g/dL — ABNORMAL HIGH (ref 6.5–8.1)

## 2020-11-18 LAB — URINALYSIS, ROUTINE W REFLEX MICROSCOPIC
Bilirubin Urine: NEGATIVE
Glucose, UA: NEGATIVE mg/dL
Ketones, ur: NEGATIVE mg/dL
Nitrite: NEGATIVE
Protein, ur: 30 mg/dL — AB
Specific Gravity, Urine: 1.025 (ref 1.005–1.030)
pH: 5.5 (ref 5.0–8.0)

## 2020-11-18 LAB — URINALYSIS, MICROSCOPIC (REFLEX)

## 2020-11-18 LAB — LIPASE, BLOOD: Lipase: 36 U/L (ref 11–51)

## 2020-11-18 NOTE — ED Triage Notes (Addendum)
Pt arrives to ED with c/o RUQ abdominal pain that started yesterday. The pain comes and goes. The pain is sharp. No N/V/D. Pt is on dialysis, missed appointment today. Pain does not radiate. Pt also noticed dysuria that started today. No hematuria.

## 2020-11-19 ENCOUNTER — Emergency Department (HOSPITAL_COMMUNITY): Payer: Commercial Managed Care - PPO

## 2020-11-19 LAB — BASIC METABOLIC PANEL
Anion gap: 20 — ABNORMAL HIGH (ref 5–15)
BUN: 97 mg/dL — ABNORMAL HIGH (ref 6–20)
CO2: 20 mmol/L — ABNORMAL LOW (ref 22–32)
Calcium: 8.5 mg/dL — ABNORMAL LOW (ref 8.9–10.3)
Chloride: 91 mmol/L — ABNORMAL LOW (ref 98–111)
Creatinine, Ser: 21 mg/dL — ABNORMAL HIGH (ref 0.61–1.24)
GFR, Estimated: 2 mL/min — ABNORMAL LOW (ref >60)
Glucose, Bld: 83 mg/dL (ref 70–99)
Potassium: 5.7 mmol/L — ABNORMAL HIGH (ref 3.5–5.1)
Sodium: 131 mmol/L — ABNORMAL LOW (ref 135–145)

## 2020-11-19 MED ORDER — SODIUM ZIRCONIUM CYCLOSILICATE 10 G PO PACK
10.0000 g | PACK | Freq: Once | ORAL | Status: AC
  Administered 2020-11-19: 10 g via ORAL
  Filled 2020-11-19: qty 1

## 2020-11-19 MED ORDER — HYDROCODONE-ACETAMINOPHEN 5-325 MG PO TABS
1.0000 | ORAL_TABLET | Freq: Once | ORAL | Status: AC
  Administered 2020-11-19: 1 via ORAL
  Filled 2020-11-19: qty 1

## 2020-11-19 MED ORDER — OXYCODONE HCL 5 MG PO TABS
5.0000 mg | ORAL_TABLET | Freq: Four times a day (QID) | ORAL | 0 refills | Status: DC | PRN
Start: 2020-11-19 — End: 2020-12-08

## 2020-11-19 NOTE — ED Provider Notes (Signed)
[facility] EMERGENCY DEPARTMENT Provider Note   CSN: [id number] Arrival date & time: 11/18/20  1655     History Chief Complaint  Patient presents with  . Abdominal Pain    Colin [name] is a 55 y.o. male.  The history is provided by the patient.  Abdominal Pain Pain location:  Epigastric and RUQ Pain quality: aching   Pain radiates to:  Does not radiate Pain severity:  Mild Onset quality:  Gradual Duration:  3 days Timing:  Constant Progression:  Unchanged Chronicity:  New Context: not previous surgeries, not recent illness and not suspicious food intake   Relieved by:  Nothing Worsened by:  Nothing Associated symptoms: nausea   Associated symptoms: no chest pain, no chills, no cough, no dysuria, no fever, no hematuria, no shortness of breath, no sore throat and no vomiting   Risk factors: has not had multiple surgeries   Risk factors comment:  ESRD on home dialysis      Past Medical History:  Diagnosis Date  . Anemia    iron deficiency  . Arthritis    Gout  . CKD (chronic kidney disease) stage 3, GFR 30-59 ml/min (HCC)    Stage 4  . Gout   . History of chicken pox   . Hypertension   . Melanosis coli   . Mumps   . Peripheral neuropathy   . Pneumonia   . Ulcerative colitis [place], [place]    Patient Active Problem List   Diagnosis Date Noted  . Pneumonia due to COVID-19 virus 03/10/2019  . Macrocytic anemia 03/10/2019  . Acute hypoxemic respiratory failure ([place])   . Hyperprolactinemia ([place]) 01/17/2018  . Screening for malignant neoplasm of prostate 01/17/2018  . Gynecomastia 01/17/2018  . Ingrown toenail 06/21/2017  . Onychomycosis 06/21/2017  . ESRD (end stage renal disease) ([place]) 02/07/2017  . Morbid obesity ([place]) 02/07/2017  . Bilateral leg edema 10/02/2015  . Hyperlipidemia 10/02/2015  . Edema 02/09/2011  . Chronic renal insufficiency 02/09/2011  . ANEMIA, IRON DEFICIENCY 10/16/2007  . Essential hypertension  10/16/2007  . Ulcerative colitis ([place]) 02/13/2007  . MELANOSIS COLI 02/13/2007    Past Surgical History:  Procedure Laterality Date  . AV FISTULA PLACEMENT Right 08/27/2016   Procedure: RIGHT RADIOCEPHALIC ARTERIOVENOUS FISTULA CREATION;  Surgeon: [name], MD;  Location: [facility] OR;  Service: Vascular;  Laterality: Right;  . AV FISTULA PLACEMENT Right 10/21/2017   Procedure: ARTERIOVENOUS (AV) FISTULA CREATION RIGHT ARM;  Surgeon: [name], MD;  Location: [facility];  Service: Vascular;  Laterality: Right;  . COLONOSCOPY    . FISTULA SUPERFICIALIZATION Right 11/12/2016   Procedure: FISTULA SUPERFICIALIZATION RIGHT FOREEARM;  Surgeon: [name], MD;  Location: [facility] OR;  Service: Vascular;  Laterality: Right;  . REVISON OF ARTERIOVENOUS FISTULA Right 12/23/2017   Procedure: CEPHALIC VEIN TRANSPOSITION ARTERIOVENOUS FISTULA RIGHT UPPER ARM;  Surgeon: [name], MD;  [facility] OR;  Service: Vascular;  Laterality: Right;  . TENDON REPAIR  1987   Left index finger       Family History  Problem Relation Age of Onset  . Hypertension Father   . Heart disease Father   . Renal Disease Father   . Hypertension Mother     Social History   Tobacco Use  . Smoking status: Never Smoker  . Smokeless tobacco: Former Systems developer    Types: Secondary school teacher  . Vaping Use: Never used  Substance Use Topics  . Alcohol  use: Yes    Comment: rare  . Drug use: No    Home Medications Prior to Admission medications   Medication Sig Start Date End Date Taking? Authorizing Provider  oxyCODONE (ROXICODONE) 5 MG immediate release tablet Take 1 tablet (5 mg total) by mouth every 6 (six) hours as needed for up to 6 doses for breakthrough pain. 11/19/20  Yes Lunabella Badgett, DO  acetaminophen (TYLENOL) 500 MG tablet Take 500 mg by mouth every 6 (six) hours as needed for mild pain or fever.     [provider]  allopurinol (ZYLOPRIM) 100 MG tablet Take 150 mg by mouth daily.    [provider]   atenolol (TENORMIN) 50 MG tablet Take 50 mg by mouth daily. 01/14/20   [provider]  calcitRIOL (ROCALTROL) 0.5 MCG capsule Take by mouth. 08/11/20   [provider]  ferrous sulfate 325 (65 FE) MG tablet Take 325 mg by mouth at bedtime.    [provider]  multivitamin (RENA-VIT) TABS tablet Take 1 tablet by mouth at bedtime. 07/24/18   [provider]  PEPCID 20 MG tablet Take 20 mg by mouth every other day.  06/06/20   [provider]  pravastatin (PRAVACHOL) 40 MG tablet Take 40 mg by mouth at bedtime.     [provider]  sevelamer carbonate (RENVELA) 800 MG tablet Take 2,400 mg by mouth 3 (three) times daily with meals.     [provider]    Allergies    Sulfa antibiotics  Review of Systems   Review of Systems  Constitutional: Negative for chills and fever.  HENT: Negative for ear pain and sore throat.   Eyes: Negative for pain and visual disturbance.  Respiratory: Negative for cough and shortness of breath.   Cardiovascular: Negative for chest pain and palpitations.  Gastrointestinal: Positive for abdominal pain and nausea. Negative for vomiting.  Genitourinary: Negative for dysuria and hematuria.  Musculoskeletal: Negative for arthralgias and back pain.  Skin: Negative for color change and rash.  Neurological: Negative for seizures and syncope.  All other systems reviewed and are negative.   Physical Exam Updated Vital Signs BP 129/83   Pulse 81   Temp 98.6 F (37 C) (Oral)   Resp (!) 29   Ht 5' 5"  (1.651 m)   Wt 104.3 kg   SpO2 91%   BMI 38.27 kg/m   Physical Exam Vitals and nursing note reviewed.  Constitutional:      General: He is not in acute distress.    Appearance: He is well-developed and well-nourished. He is not ill-appearing.  HENT:     Head: Normocephalic and atraumatic.     Mouth/Throat:     Mouth: Mucous membranes are moist.  Eyes:     Extraocular Movements: Extraocular movements  intact.     Conjunctiva/sclera: Conjunctivae normal.  Cardiovascular:     Rate and Rhythm: Normal rate and regular rhythm.     Heart sounds: Normal heart sounds. No murmur heard.   Pulmonary:     Effort: Pulmonary effort is normal. No respiratory distress.     Breath sounds: Normal breath sounds.  Abdominal:     Palpations: Abdomen is soft.     Tenderness: There is abdominal tenderness in the right upper quadrant and epigastric area.  Musculoskeletal:        General: No edema.     Cervical back: Neck supple.  Skin:    General: Skin is warm and dry.  Capillary Refill: Capillary refill takes less than 2 seconds.  Neurological:     General: No focal deficit present.     Mental Status: He is alert.  Psychiatric:        Mood and Affect: Mood and affect normal.     ED Results / Procedures / Treatments   Labs (all labs ordered are listed, but only abnormal results are displayed) Labs Reviewed  COMPREHENSIVE METABOLIC PANEL - Abnormal; Notable for the following components:      Result Value   Sodium 133 (*)    Potassium 6.1 (*)    Chloride 93 (*)    BUN 89 (*)    Creatinine, Ser 18.94 (*)    Calcium 8.7 (*)    Total Protein 8.2 (*)    AST 11 (*)    Alkaline Phosphatase 28 (*)    GFR, Estimated 3 (*)    Anion gap 18 (*)    All other components within normal limits  CBC - Abnormal; Notable for the following components:   RBC 2.39 (*)    Hemoglobin 8.7 (*)    HCT 26.9 (*)    MCV 112.6 (*)    MCH 36.4 (*)    All other components within normal limits  URINALYSIS, ROUTINE W REFLEX MICROSCOPIC - Abnormal; Notable for the following components:   Hgb urine dipstick SMALL (*)    Protein, ur 30 (*)    Leukocytes,Ua TRACE (*)    All other components within normal limits  URINALYSIS, MICROSCOPIC (REFLEX) - Abnormal; Notable for the following components:   Bacteria, UA RARE (*)    All other components within normal limits  BASIC METABOLIC PANEL - Abnormal; Notable for the  following components:   Sodium 131 (*)    Potassium 5.7 (*)    Chloride 91 (*)    CO2 20 (*)    BUN 97 (*)    Creatinine, Ser 21.00 (*)    Calcium 8.5 (*)    GFR, Estimated 2 (*)    Anion gap 20 (*)    All other components within normal limits  URINE CULTURE  LIPASE, BLOOD    EKG EKG Interpretation  Date/Time:  Wednesday November 19 2020 08:36:21 EST Ventricular Rate:  86 PR Interval:    QRS Duration: 99 QT Interval:  377 QTC Calculation: 451 R Axis:   54 Text Interpretation: Sinus rhythm Baseline wander in lead(s) V1 V2 Confirmed by Lennice Sites [phone]) on 11/19/2020 8:42:57 AM   Radiology CT ABDOMEN PELVIS WO CONTRAST  Result Date: 11/19/2020 CLINICAL DATA:  Abdominal distension for 3 days. Right upper quadrant pain EXAM: CT ABDOMEN AND PELVIS WITHOUT CONTRAST TECHNIQUE: Multidetector CT imaging of the abdomen and pelvis was performed following the standard protocol without IV contrast. COMPARISON:  11/14/2010 FINDINGS: Lower chest:  Scattered coronary atherosclerotic calcifications. Hepatobiliary: Hepatic steatosis. There is sparing at the lower fissures/gallbladder fossa.No evidence of biliary obstruction or stone. Pancreas: Unremarkable. Spleen: Unremarkable. Adrenals/Urinary Tract: Negative adrenals. No hydronephrosis or stone. Symmetric renal atrophy. Unremarkable bladder. Stomach/Bowel:  No obstruction. No appendicitis. Vascular/Lymphatic: No acute vascular abnormality. Scattered atheromatous calcifications. No mass or adenopathy. Reproductive:No acute finding. Other: No ascites or pneumoperitoneum. Musculoskeletal: No acute abnormalities. IMPRESSION: 1. No acute finding. 2. Hepatic steatosis. 3. Renal atrophy. Electronically Signed   By: Monte Fantasia M.D.   On: 11/19/2020 09:39    Procedures Procedures   Medications Ordered in ED Medications  sodium zirconium cyclosilicate (LOKELMA) packet 10 g (10 g Oral Given 11/19/20 0839)  HYDROcodone-acetaminophen (NORCO/VICODIN)  5-325 MG per tablet 1 tablet (1 tablet Oral Given 11/19/20 1950)    ED Course  I have reviewed the triage vital signs and the nursing notes.  Pertinent labs & imaging results that were available during my care of the patient were reviewed by me and considered in my medical decision making (see chart for details).    MDM Rules/Calculators/A&P                          Colin [name] is a 55 year old male with history of end-stage renal disease on hemodialysis who presents the ED with abdominal pain.  Has had fairly persistent upper abdominal pain for the last 3 days.  Worse with movement and palpation.  Does not know if it is related to eating food or not.  Has already had lab work prior to my evaluation.  Gallbladder and liver enzymes within normal limits.  Lipase normal.  Lower suspicion for cholecystitis or pancreatitis but will get a CT scan of the abdomen and pelvis to evaluate for those things as well as appendicitis, bowel obstruction, kidney stone.  Could be a musculoskeletal issue as could be pain in the rib area.  Denies any other infectious symptoms.  Does have some irritation when he urinates but urinalysis overall equivocal for UTI.  May consider antibiotics while we await a culture.  However lab work was significant for potassium of 6.1.  Patient does home dialysis.  Has not had dialysis since Saturday because he did not feel well do it on Monday and he has been here for almost 18 hours and was unable to do dialysis yesterday due to being in the waiting room.  EKG however shows sinus rhythm.  No hyperkalemic changes.  We will give him a dose of Lokelma.  Will get CT scan and may need to get an ultrasound of the gallbladder.  Will repeat BMP given that his BMP was originally collected about 12 hours ago but EKG is reassuring.  Patient with repeat BMP that showed improved potassium of 5.7.  Talked with nephrology and recommend obviously dialysis today and then possibly tomorrow at home to make up  for session.  Otherwise general follow-up.  CT scan of the abdomen and pelvis showed no gallstones, no cholecystitis, no pancreatitis.  No pneumonia in the lower lungs.  Overall suspect musculoskeletal type pain.  Urine culture has been sent.  Lower suspicion for UTI.  Will prescribe narcotic pain medication for breakthrough pain as patient cannot take ibuprofen.  Recommend Tylenol and lidocaine patch.  Recommend follow-up with primary care doctor or return to ED if symptoms worsen.  Discharged in good condition.  This chart was dictated using voice recognition software.  Despite best efforts to proofread,  errors can occur which can change the documentation meaning.    Final Clinical Impression(s) / ED Diagnoses Final diagnoses:  Abdominal pain, unspecified abdominal location  Hyperkalemia    Rx / DC Orders ED Discharge Orders         Ordered    oxyCODONE (ROXICODONE) 5 MG immediate release tablet  Every 6 hours PRN        11/19/20 [place], [place], DO 11/19/20 1038

## 2020-11-19 NOTE — ED Notes (Signed)
Patient transported to CT 

## 2020-11-19 NOTE — Discharge Instructions (Addendum)
Recommend Tylenol lidocaine patch and narcotic pain medicine for breakthrough pain for right-sided abdominal/rib pain.  Please do dialysis today and consider even dialysis session tomorrow.  Please return to the ED if symptoms worsen.  Follow-up with your primary care doctor.

## 2020-11-20 LAB — URINE CULTURE: Culture: NO GROWTH

## 2020-12-06 NOTE — Progress Notes (Signed)
Date:  12/06/2020   ID:  Colin [name], DOB [birth year], MRN [id number]  Patient Location:  [place]  [place] [address]   Provider location:   [facility], [place] office  PCP:  Gaynelle Arabian, MD  Cardiologist:  Ida Rogue, MD   Chief Complaint  Patient presents with  . office visit-Patient c/o fatigue, DOE, and lightheadedness    History of Present Illness:    [name] is a 55 y.o. male past medical history of hypertension,  stage IV kidney disease,attributed to systemic hypertension , diagnosis of focal segmental glomerulosclerosis AV fistula, for HD ulcerative colitis dating back to 2000 on Humira  Chronic leg edema Echo normal in 01/2014 awaiting kidney transplant at [facility] Who presents for leg edema, HTN, prekidney transplant eval  Last seen in person in clinic 01/2017 Visit with one of our providers: 09/2020   lexiscan myoview 06/2020 that showed normal wall motion, EF 53%, low risk, small region of ischemia in the distal anterior wall, apical attenuation correction.   Echo in August at outside office showed EF 55-60%, G1DD, mod concentric LVH, no significant valvular disease  testosterone was low , now being treated Covid 02/2019  Also started summer 2021 home dialysis, chemo, started Nov 2021 4 days a week- M, T, TH, Fr. Dr. Abigail Butts following Periodically with low blood pressure, sometimes lightheaded Nephrology increased dry weight  In the ER 11/19/2020: records reviewed ABD pain, CR 18-21, BUN 89, HGB 8.7, sodium 131  ER 08/2020: dizziness after HD  Main complaint today is no energy No regular exercise program, sleeping in the daytime Sleeping issues as detailed below  Echo at the [facility] 05/2020, moderate LVH  Sleep study at [facility] On CPAP, started at [place], started Jan 2022 Comes off face Wakes up feeling tired, naps in the day  He is concerned about elevated heart rate Does not take atenolol, only as needed  Recent lab work  reviewed November 06, 2020 hemoglobin 8.3 Vitamin D 9.5 Total cholesterol 178 LDL 76 Hemoglobin 10 point 1 AM 03/02/2021  EKG personally reviewed by myself on todays visit Sinus tachycardia rate 110 bpm  Past medical history reviewed CT scan January 2020 showing mild atherosclerosis in the proximal left common iliac vessel Otherwise relatively clean distal aorta and iliac, femoral artery vessels   Prior CV studies:   The following studies were reviewed today:  Echocardiogram April 2015 ejection fraction 55 to 60%  Past Medical History:  Diagnosis Date  . Anemia    iron deficiency  . Arthritis    Gout  . CKD (chronic kidney disease) stage 3, GFR 30-59 ml/min (HCC)    Stage 4  . Gout   . History of chicken pox   . Hypertension   . Melanosis coli   . Mumps   . Peripheral neuropathy   . Pneumonia   . Ulcerative colitis [place], [place]   Past Surgical History:  Procedure Laterality Date  . AV FISTULA PLACEMENT Right 08/27/2016   Procedure: RIGHT RADIOCEPHALIC ARTERIOVENOUS FISTULA CREATION;  Surgeon: [name], MD;  Location: [facility] OR;  Service: Vascular;  Laterality: Right;  . AV FISTULA PLACEMENT Right 10/21/2017   Procedure: ARTERIOVENOUS (AV) FISTULA CREATION RIGHT ARM;  Surgeon: [name], MD;  Location: [facility];  Service: Vascular;  Laterality: Right;  . COLONOSCOPY    . FISTULA SUPERFICIALIZATION Right 11/12/2016   Procedure: FISTULA SUPERFICIALIZATION RIGHT FOREEARM;  Surgeon: [name], MD;  Location: [facility] OR;  Service: Vascular;  Laterality: Right;  . REVISON OF ARTERIOVENOUS FISTULA Right 12/23/2017   Procedure: CEPHALIC VEIN TRANSPOSITION ARTERIOVENOUS FISTULA RIGHT UPPER ARM;  Surgeon: [name], MD;  Location: [facility] OR;  Service: Vascular;  Laterality: Right;  . TENDON REPAIR  1987   Left index finger     No outpatient medications have been marked as taking for the 12/08/20 encounter (Appointment) with Minna Merritts, MD.     Allergies:   Sulfa  antibiotics   Social History   Tobacco Use  . Smoking status: Never Smoker  . Smokeless tobacco: Former Systems developer    Types: Secondary school teacher  . Vaping Use: Never used  Substance Use Topics  . Alcohol use: Yes    Comment: rare  . Drug use: No     Family Hx: The patient's family history includes Heart disease in his father; Hypertension in his father and mother; Renal Disease in his father.  ROS:   Please see the history of present illness.    Review of Systems  Constitutional: Negative.   Respiratory: Negative.   Cardiovascular: Negative.   Gastrointestinal: Negative.   Musculoskeletal: Negative.   Neurological: Negative.   Psychiatric/Behavioral: Negative.   All other systems reviewed and are negative.    Labs/Other Tests and Data Reviewed:    Recent Labs: 09/22/2020: TSH 2.376 11/18/2020: ALT 18; Hemoglobin 8.7; Platelets 238 11/19/2020: BUN 97; Creatinine, Ser 21.00; Potassium 5.7; Sodium 131   Recent Lipid Panel No results found for: CHOL, TRIG, HDL, CHOLHDL, LDLCALC, LDLDIRECT  Wt Readings from Last 3 Encounters:  11/18/20 230 lb (104.3 kg)  09/22/20 240 lb (108.9 kg)  05/10/20 (!) 227 lb 1.2 oz (103 kg)     Exam:    BP 126/79 (BP Location: Left Arm, Patient Position: Sitting, Cuff Size: Large)   Pulse (!) 110   Ht 5' 5"  (1.651 m)   Wt 238 lb (108 kg)   SpO2 95%   BMI 39.61 kg/m  Constitutional:  oriented to person, place, and time. No distress.  HENT:  Head: Grossly normal Eyes:  no discharge. No scleral icterus.  Neck: No JVD, no carotid bruits  Cardiovascular: Regular rate and rhythm, no murmurs appreciated Pulmonary/Chest: Clear to auscultation bilaterally, no wheezes or rails Abdominal: Soft.  no distension.  no tenderness.  Musculoskeletal: Normal range of motion Neurological:  normal muscle tone. Coordination normal. No atrophy Skin: Skin warm and dry Psychiatric: normal affect, pleasant  ASSESSMENT & PLAN:    1. Morbid obesity ([place]) We have  encouraged continued exercise, careful diet management in an effort to lose weight.  2. ESRD (end stage renal disease) ([place]) Followed by nephrology Echo done at the [facility] moderate LVH normal LV function no significant valve disease  3. Essential hypertension Some blood pressure running low, If numbers get worse could add midodrine Orthostatics checked today, minimal drop in blood pressure no reported lightheadedness with systolic pressure between 998 and 338 systolic  4. Localized edema Stable, managed by dialysis  5.  Chronic fatigue Concern for poor sleep hygiene, CPAP started January 2022, has not felt well since that time, napping in the daytime, does not wake up feeling refreshed -Recommend he discuss this with sleep doctor at the [place] -Supplemental vitamin D - ZIO monitor placed for sinus tachycardia He has periodic anemia which may contribute to fatigue Also concerned he may have fatigue just being on dialysis Denies depression  6.  Sinus tachycardia Etiology unclear, likely related  to volume/hypovolemia, anemia Normal LV function on echo August 2021   Records requested from [place], echocardiogram reviewed, lab work and notes reviewed from nephrology  Total encounter time more than 35 minutes  Greater than 50% was spent in counseling and coordination of care with the patient   Signed, Ida Rogue, MD  12/06/2020 1:28 PM    [facility] [id number], [place], [place] [address]

## 2020-12-08 ENCOUNTER — Ambulatory Visit: Payer: Commercial Managed Care - PPO | Admitting: Cardiovascular Disease

## 2020-12-08 ENCOUNTER — Encounter: Payer: Self-pay | Admitting: Cardiovascular Disease

## 2020-12-08 ENCOUNTER — Other Ambulatory Visit: Payer: Self-pay

## 2020-12-08 ENCOUNTER — Ambulatory Visit: Payer: Commercial Managed Care - PPO

## 2020-12-08 VITALS — BP 126/79 | HR 110 | Ht 65.0 in | Wt 238.0 lb

## 2020-12-08 DIAGNOSIS — E782 Mixed hyperlipidemia: Secondary | ICD-10-CM

## 2020-12-08 DIAGNOSIS — N186 End stage renal disease: Secondary | ICD-10-CM

## 2020-12-08 DIAGNOSIS — I10 Essential (primary) hypertension: Secondary | ICD-10-CM

## 2020-12-08 DIAGNOSIS — I479 Paroxysmal tachycardia, unspecified: Secondary | ICD-10-CM

## 2020-12-08 DIAGNOSIS — R5382 Chronic fatigue, unspecified: Secondary | ICD-10-CM

## 2020-12-08 DIAGNOSIS — R600 Localized edema: Secondary | ICD-10-CM | POA: Diagnosis not present

## 2020-12-08 NOTE — Patient Instructions (Addendum)
For your ongoing Fatigue:  think about...  Sleep/CPAP  Vit D  Low blood count  Fast heart rate  Medication Instructions:  No changes Talk with [place], Do we need MIDODRINE as needed, to push blood pressure up  Lab work: No new labs needed  Testing/Procedures:  ZIO monitor  (paroxysmal tachycardia)  Your physician has recommended that you wear a Zio monitor. This monitor is a medical device that records the heart's electrical activity. Doctors most often use these monitors to diagnose arrhythmias. Arrhythmias are problems with the speed or rhythm of the heartbeat. The monitor is a small device applied to your chest. You can wear one while you do your normal daily activities. While wearing this monitor if you have any symptoms to push the button and record what you felt. Once you have worn this monitor for the period of time provider prescribed (Usually 14 days), you will return the monitor device in the postage paid box. Once it is returned they will download the data collected and provide [place] with a report which the provider will then review and we will call you with those results. Important tips:  1. Avoid showering during the first 24 hours of wearing the monitor. 2. Avoid excessive sweating to help maximize wear time. 3. Do not submerge the device, no hot tubs, and no swimming pools. 4. Keep any lotions or oils away from the patch. 5. After 24 hours you may shower with the patch on. Take brief showers with your back facing the shower head.  6. Do not remove patch once it has been placed because that will interrupt data and decrease adhesive wear time. 7. Push the button when you have any symptoms and write down what you were feeling. 8. Once you have completed wearing your monitor, remove and place into box which has postage paid and place in your outgoing mailbox.  9. If for some reason you have misplaced your box then call our office and we can provide another box and/or mail it  off for you.        Follow-Up:  . You will need a follow up appointment in 6 months , app ok  . Providers on your designated Care Team:   . Murray Hodgkins, NP . Christell Faith, PA-C . Marrianne Mood, PA-C  Any Other Special Instructions Will Be Listed Below (If Applicable).  Monitor blood pressure Please monitor blood pressures and keep a log of your readings.    How to use a home blood pressure monitor. . Be still. . Measure at the same time every day. It's important to take the readings at the same time each day, such as morning and evening. Take reading approximately 1 1/2 to 2 hours after BP medications.   AVOID these things for 30 minutes before checking your blood pressure:  Drinking caffeine.  Drinking alcohol.  Eating.  Smoking.  Exercising.   Five minutes before checking your blood pressure:  Pee.  Sit in a dining chair. Avoid sitting in a soft couch or armchair.  Be quiet. Do not talk.       Sit correctly. Sit with your back straight and supported (on a dining chair, rather than a sofa). Your feet should be flat on the floor and your legs should not be crossed. Your arm should be supported on a flat surface (such as a table) with the upper arm at heart level. Make sure the bottom of the cuff is placed directly above the bend of the  elbow.

## 2020-12-10 ENCOUNTER — Other Ambulatory Visit: Payer: Self-pay

## 2020-12-10 ENCOUNTER — Ambulatory Visit (HOSPITAL_COMMUNITY)
Admission: EM | Admit: 2020-12-10 | Discharge: 2020-12-10 | Disposition: A | Discharge status: Home / Self Care | Payer: Commercial Managed Care - PPO | Attending: Urgent Care | Admitting: Urgent Care

## 2020-12-10 ENCOUNTER — Encounter (HOSPITAL_COMMUNITY): Payer: Self-pay

## 2020-12-10 ENCOUNTER — Ambulatory Visit (INDEPENDENT_AMBULATORY_CARE_PROVIDER_SITE_OTHER): Payer: Commercial Managed Care - PPO

## 2020-12-10 DIAGNOSIS — Z992 Dependence on renal dialysis: Secondary | ICD-10-CM

## 2020-12-10 DIAGNOSIS — M79675 Pain in left toe(s): Secondary | ICD-10-CM

## 2020-12-10 DIAGNOSIS — S99922A Unspecified injury of left foot, initial encounter: Secondary | ICD-10-CM | POA: Diagnosis not present

## 2020-12-10 DIAGNOSIS — S92512A Displaced fracture of proximal phalanx of left lesser toe(s), initial encounter for closed fracture: Secondary | ICD-10-CM | POA: Diagnosis not present

## 2020-12-10 DIAGNOSIS — N186 End stage renal disease: Secondary | ICD-10-CM

## 2020-12-10 MED ORDER — HYDROCODONE-ACETAMINOPHEN 5-325 MG PO TABS: 1.0000 | ORAL_TABLET | Freq: Four times a day (QID) | ORAL | 0 refills | Status: DC | PRN

## 2020-12-10 MED ORDER — ACETAMINOPHEN 325 MG PO TABS
ORAL_TABLET | ORAL | Status: AC
  Filled 2020-12-10: qty 2

## 2020-12-10 MED ORDER — ACETAMINOPHEN 325 MG PO TABS
650.0000 mg | ORAL_TABLET | Freq: Once | ORAL | Status: AC
  Administered 2020-12-10: 650 mg via ORAL

## 2020-12-10 NOTE — ED Provider Notes (Signed)
[place]   MRN: [id number] DOB: [birth year]  Subjective:   Colin [name] is a 55 y.o. male presenting for suffering a left fifth toe injury last night.  Patient states that he stubbed his foot against the bed.  Has had some swelling and persistent pain, slight difficulty bearing weight on the left foot.  Has not taken any medications for pain relief.  Patient is on dialysis, has ESRD.   Current Facility-Administered Medications:  .  acetaminophen (TYLENOL) tablet 650 mg, 650 mg, Oral, Once, Jaynee Eagles, PA-C  Current Outpatient Medications:  .  allopurinol (ZYLOPRIM) 100 MG tablet, Take 100 mg by mouth daily., Disp: , Rfl:  .  amitriptyline (ELAVIL) 25 MG tablet, Take 75 mg by mouth at bedtime., Disp: , Rfl:  .  atenolol (TENORMIN) 50 MG tablet, Take 50 mg by mouth daily as needed., Disp: , Rfl:  .  calcitRIOL (ROCALTROL) 0.25 MCG capsule, Take 0.25 mcg by mouth daily., Disp: , Rfl:  .  cinacalcet (SENSIPAR) 30 MG tablet, Take 30 mg by mouth daily., Disp: , Rfl:  .  ferrous sulfate 325 (65 FE) MG tablet, Take 325 mg by mouth at bedtime., Disp: , Rfl:  .  FLUoxetine (PROZAC) 10 MG tablet, Take 10 mg by mouth daily., Disp: , Rfl:  .  levothyroxine (SYNTHROID) 50 MCG tablet, Take 50 mcg by mouth daily before breakfast., Disp: , Rfl:  .  multivitamin (RENA-VIT) TABS tablet, Take 1 tablet by mouth at bedtime., Disp: , Rfl: 10 .  PEPCID 20 MG tablet, Take 20 mg by mouth every other day. , Disp: , Rfl:  .  pravastatin (PRAVACHOL) 40 MG tablet, Take 40 mg by mouth at bedtime. , Disp: , Rfl:  .  sevelamer carbonate (RENVELA) 800 MG tablet, Takes 6 tablets TID with meals PRN and 4 tablets with snacks BID PRN, Disp: , Rfl:  .  traMADol (ULTRAM) 50 MG tablet, Take by mouth every 6 (six) hours as needed., Disp: , Rfl:    Allergies  Allergen Reactions  . Sulfa Antibiotics Shortness Of Breath and Rash    Hives, itching    Past Medical History:  Diagnosis Date  . Anemia     iron deficiency  . Arthritis    Gout  . CKD (chronic kidney disease) stage 3, GFR 30-59 ml/min (HCC)    Stage 4  . Gout   . History of chicken pox   . Hypertension   . Melanosis coli   . Mumps   . Peripheral neuropathy   . Pneumonia   . Ulcerative colitis [place], [place]     Past Surgical History:  Procedure Laterality Date  . AV FISTULA PLACEMENT Right 08/27/2016   Procedure: RIGHT RADIOCEPHALIC ARTERIOVENOUS FISTULA CREATION;  Surgeon: [name], MD;  Location: [facility] OR;  Service: Vascular;  Laterality: Right;  . AV FISTULA PLACEMENT Right 10/21/2017   Procedure: ARTERIOVENOUS (AV) FISTULA CREATION RIGHT ARM;  Surgeon: [name], MD;  Location: [place];  Service: Vascular;  Laterality: Right;  . COLONOSCOPY    . FISTULA SUPERFICIALIZATION Right 11/12/2016   Procedure: FISTULA SUPERFICIALIZATION RIGHT FOREEARM;  Surgeon: [name], MD;  Location: [place];  Service: Vascular;  Laterality: Right;  . REVISON OF ARTERIOVENOUS FISTULA Right 12/23/2017   Procedure: CEPHALIC VEIN TRANSPOSITION ARTERIOVENOUS FISTULA RIGHT UPPER ARM;  Surgeon: [name], MD;  Location: [facility];  Service: Vascular;  Laterality: Right;  . [place]  Left index finger    Family History  Problem Relation Age of Onset  . Hypertension Father   . Heart disease Father   . Renal Disease Father   . Hypertension Mother     Social History   Tobacco Use  . Smoking status: Never Smoker  . Smokeless tobacco: Former Systems developer    Types: Secondary school teacher  . Vaping Use: Never used  Substance Use Topics  . Alcohol use: Yes    Comment: rare  . Drug use: No    ROS   Objective:   Vitals: BP 126/78 (BP Location: Right Arm)   Pulse (!) 125   Temp 98.8 F (37.1 C) (Oral)   Resp 20   SpO2 96%   Physical Exam Constitutional:      General: He is not in acute distress.    Appearance: Normal appearance. He is well-developed and normal weight. He is not ill-appearing, toxic-appearing or  diaphoretic.  HENT:     Head: Normocephalic and atraumatic.     Right Ear: External ear normal.     Left Ear: External ear normal.     Nose: Nose normal.     Mouth/Throat:     Pharynx: Oropharynx is clear.  Eyes:     General: No scleral icterus.       Right eye: No discharge.        Left eye: No discharge.     Extraocular Movements: Extraocular movements intact.     Pupils: Pupils are equal, round, and reactive to light.  Cardiovascular:     Rate and Rhythm: Normal rate.  Pulmonary:     Effort: Pulmonary effort is normal.  Musculoskeletal:     Cervical back: Normal range of motion.       Feet:  Neurological:     Mental Status: He is alert and oriented to person, place, and time.  Psychiatric:        Mood and Affect: Mood normal.        Behavior: Behavior normal.        Thought Content: Thought content normal.        Judgment: Judgment normal.     DG Toe 5th Left  Result Date: 12/10/2020 CLINICAL DATA:  Pain following injury EXAM: DG TOE 5TH LEFT:3 V COMPARISON:  Left foot radiographs October 28, 2006 FINDINGS: Frontal, oblique, and lateral views were obtained. There is an obliquely oriented fracture of the distal aspect of the fifth proximal phalanx with lateral angulation distally. No other fracture. No dislocation. Joint spaces appear normal. No erosive change. IMPRESSION: Obliquely oriented fracture distal aspect second proximal phalanx with lateral angulation distally. No other fracture. No dislocation. No arthropathy. These results will be called to the ordering clinician or representative by the Radiologist Assistant, and communication documented in the PACS or [employer]. Electronically Signed   By: Lowella Grip III M.D.   On: 12/10/2020 08:46     Assessment and Plan :   PDMP not reviewed this encounter.  1. Closed displaced fracture of proximal phalanx of lesser toe of left foot, initial encounter   2. Toe pain, left   3. ESRD on dialysis [facility])      Patient is neurovascularly intact.  We will place him in a postop shoe, follow-up with podiatry.  Schedule Tylenol for pain relief, use hydrocodone for breakthrough pain. Counseled patient on potential for adverse effects with medications prescribed/recommended today, ER and return-to-clinic precautions discussed, patient verbalized understanding.    Jaynee Eagles,  PA-C 12/10/20 [id number]

## 2020-12-10 NOTE — ED Triage Notes (Signed)
Pt presents with pain to the left toe x last night. Pt states he hit his left pinky toe on the headboard last night. He states he did not notice swelling.

## 2020-12-10 NOTE — Discharge Instructions (Signed)
Please schedule Tylenol at 500 mg - 650 mg once every 6 hours as needed for aches and pains.  If you still have pain despite taking Tylenol regularly, this is breakthrough pain.  You can use hydrocodone once every 6 hours for this.  Once your pain is better controlled, switch back to just Tylenol. Please follow up with a podiatrist listed below for further management.     [place] ([place] [place] in [place], [place]: [email or web address] Get online care: [email or web address] Address: [address], [place], [place] [address] Phone: [phone] Appointments: [email or web address]   [place], [place] [facility] in [place], [place] Address: [address] [place], [place] [address] Phone: [phone]

## 2020-12-12 ENCOUNTER — Telehealth: Payer: Self-pay

## 2020-12-12 NOTE — Telephone Encounter (Signed)
NOTES ON FILE FROM [place] [phone], SENT REFERRAL TO SCHEDULING

## 2020-12-24 ENCOUNTER — Ambulatory Visit: Payer: Commercial Managed Care - PPO | Admitting: Podiatry

## 2020-12-31 ENCOUNTER — Ambulatory Visit: Payer: Commercial Managed Care - PPO | Admitting: Podiatry

## 2020-12-31 ENCOUNTER — Other Ambulatory Visit: Payer: Self-pay

## 2020-12-31 ENCOUNTER — Ambulatory Visit (INDEPENDENT_AMBULATORY_CARE_PROVIDER_SITE_OTHER): Payer: Commercial Managed Care - PPO

## 2020-12-31 DIAGNOSIS — S9030XA Contusion of unspecified foot, initial encounter: Secondary | ICD-10-CM

## 2020-12-31 DIAGNOSIS — S92504A Nondisplaced unspecified fracture of right lesser toe(s), initial encounter for closed fracture: Secondary | ICD-10-CM

## 2021-01-21 NOTE — Progress Notes (Signed)
   HPI: 55 y.o. male presenting today as a referral from the urgent care for evaluation of a fracture to the fifth digit of the left foot.  Patient states that he injured his foot approximately 2 weeks ago and he was seen urgent care.  DOI: 12/18/2020.  At urgent care he was diagnosed with a fracture and referred here for further treatment and evaluation.  He is currently wearing a postsurgical shoe.  Currently the pain is tolerable  Past Medical History:  Diagnosis Date  . Anemia    iron deficiency  . Arthritis    Gout  . CKD (chronic kidney disease) stage 3, GFR 30-59 ml/min (HCC)    Stage 4  . Gout   . History of chicken pox   . Hypertension   . Melanosis coli   . Mumps   . Peripheral neuropathy   . Pneumonia   . Ulcerative colitis [place], [place]     Physical Exam: General: The patient is alert and oriented x3 in no acute distress.  Dermatology: Skin is warm, dry and supple bilateral lower extremities. Negative for open lesions or macerations.  Vascular: Palpable pedal pulses bilaterally. No edema or erythema noted. Capillary refill within normal limits.  Neurological: Epicritic and protective threshold grossly intact bilaterally.   Musculoskeletal Exam: Range of motion within normal limits to all pedal and ankle joints bilateral. Muscle strength 5/5 in all groups bilateral.   Radiographic Exam:  Normal osseous mineralization. Joint spaces preserved.  Closed, minimally displaced fracture of the phalanx of the left fifth toe noted  Assessment: 1.  Fracture left fifth toe closed, nondisplaced, initial encounter   Plan of Care:  1. Patient evaluated. X-Rays reviewed.  2.  Continue minimal weightbearing and conservative treatments including rest ice elevation and compression. 3.  Patient is getting custom molded orthotics at the [facility] tomorrow. 4.  Continue wearing postsurgical shoe x3-4 weeks until the pain resolves and then he can transfer into the custom molded  orthotics 5.  Return to clinic in 6 weeks for follow-up x-ray      Edrick Kins, DPM [facility]  Dr. Edrick Kins, DPM    [address], [place] [address]                Office [phone]  Fax [phone]

## 2021-01-29 ENCOUNTER — Telehealth: Payer: Self-pay | Admitting: *Deleted

## 2021-01-29 NOTE — Telephone Encounter (Signed)
Spoke with Aldona Bar at the dialysis center about pt report that he was unable to complete his dialysis today. There is a section of the fistula that has lost a thrill and bruit. She was calling to find out if we have availability for a fistulogram before Tuesday 4/19. We do not have available. The patient will keep his appt with [facility] on Tuesday. If the patient needs a dialysis catheter over the weekend, the patient can call our office to notify the on call surgeon this weekend.

## 2021-02-06 ENCOUNTER — Encounter (HOSPITAL_COMMUNITY): Payer: Self-pay

## 2021-02-06 ENCOUNTER — Emergency Department (HOSPITAL_COMMUNITY): Payer: Commercial Managed Care - PPO

## 2021-02-06 ENCOUNTER — Other Ambulatory Visit: Payer: Self-pay

## 2021-02-06 ENCOUNTER — Observation Stay (HOSPITAL_COMMUNITY)
Admission: EM | Admit: 2021-02-06 | Discharge: 2021-02-07 | Disposition: A | Discharge status: Home / Self Care | Payer: Commercial Managed Care - PPO | Attending: Internal Medicine | Admitting: Internal Medicine

## 2021-02-06 DIAGNOSIS — D62 Acute posthemorrhagic anemia: Secondary | ICD-10-CM | POA: Diagnosis not present

## 2021-02-06 DIAGNOSIS — E875 Hyperkalemia: Secondary | ICD-10-CM | POA: Insufficient documentation

## 2021-02-06 DIAGNOSIS — D649 Anemia, unspecified: Secondary | ICD-10-CM

## 2021-02-06 DIAGNOSIS — I120 Hypertensive chronic kidney disease with stage 5 chronic kidney disease or end stage renal disease: Secondary | ICD-10-CM | POA: Diagnosis not present

## 2021-02-06 DIAGNOSIS — F32A Depression, unspecified: Secondary | ICD-10-CM | POA: Insufficient documentation

## 2021-02-06 DIAGNOSIS — Z79899 Other long term (current) drug therapy: Secondary | ICD-10-CM | POA: Insufficient documentation

## 2021-02-06 DIAGNOSIS — E211 Secondary hyperparathyroidism, not elsewhere classified: Secondary | ICD-10-CM | POA: Diagnosis not present

## 2021-02-06 DIAGNOSIS — Z87891 Personal history of nicotine dependence: Secondary | ICD-10-CM | POA: Insufficient documentation

## 2021-02-06 DIAGNOSIS — N186 End stage renal disease: Secondary | ICD-10-CM | POA: Insufficient documentation

## 2021-02-06 DIAGNOSIS — E785 Hyperlipidemia, unspecified: Secondary | ICD-10-CM | POA: Insufficient documentation

## 2021-02-06 DIAGNOSIS — R42 Dizziness and giddiness: Secondary | ICD-10-CM | POA: Diagnosis present

## 2021-02-06 DIAGNOSIS — E039 Hypothyroidism, unspecified: Secondary | ICD-10-CM | POA: Diagnosis not present

## 2021-02-06 DIAGNOSIS — I9589 Other hypotension: Secondary | ICD-10-CM | POA: Insufficient documentation

## 2021-02-06 DIAGNOSIS — Z20822 Contact with and (suspected) exposure to covid-19: Secondary | ICD-10-CM | POA: Insufficient documentation

## 2021-02-06 DIAGNOSIS — Z992 Dependence on renal dialysis: Secondary | ICD-10-CM

## 2021-02-06 LAB — PREPARE RBC (CROSSMATCH)

## 2021-02-06 LAB — BASIC METABOLIC PANEL
Anion gap: 16 — ABNORMAL HIGH (ref 5–15)
BUN: 113 mg/dL — ABNORMAL HIGH (ref 6–20)
CO2: 21 mmol/L — ABNORMAL LOW (ref 22–32)
Calcium: 8.4 mg/dL — ABNORMAL LOW (ref 8.9–10.3)
Chloride: 97 mmol/L — ABNORMAL LOW (ref 98–111)
Creatinine, Ser: 20.22 mg/dL — ABNORMAL HIGH (ref 0.61–1.24)
GFR, Estimated: 2 mL/min — ABNORMAL LOW (ref >60)
Glucose, Bld: 96 mg/dL (ref 70–99)
Potassium: 6.1 mmol/L — ABNORMAL HIGH (ref 3.5–5.1)
Sodium: 134 mmol/L — ABNORMAL LOW (ref 135–145)

## 2021-02-06 LAB — RESP PANEL BY RT-PCR (FLU A&B, COVID) ARPGX2
Influenza A by PCR: NEGATIVE
Influenza B by PCR: NEGATIVE
SARS Coronavirus 2 by RT PCR: NEGATIVE

## 2021-02-06 LAB — CBC
HCT: 21.8 % — ABNORMAL LOW (ref 39.0–52.0)
Hemoglobin: 7.1 g/dL — ABNORMAL LOW (ref 13.0–17.0)
MCH: 36.2 pg — ABNORMAL HIGH (ref 26.0–34.0)
MCHC: 32.6 g/dL (ref 30.0–36.0)
MCV: 111.2 fL — ABNORMAL HIGH (ref 80.0–100.0)
Platelets: 213 10*3/uL (ref 150–400)
RBC: 1.96 MIL/uL — ABNORMAL LOW (ref 4.22–5.81)
RDW: 13.9 % (ref 11.5–15.5)
WBC: 5.8 10*3/uL (ref 4.0–10.5)
nRBC: 0 % (ref 0.0–0.2)

## 2021-02-06 MED ORDER — ONDANSETRON HCL 4 MG/2ML IJ SOLN
4.0000 mg | Freq: Four times a day (QID) | INTRAMUSCULAR | Status: DC | PRN
  Administered 2021-02-06: 4 mg via INTRAVENOUS
  Filled 2021-02-06: qty 2

## 2021-02-06 MED ORDER — SODIUM POLYSTYRENE SULFONATE 15 GM/60ML PO SUSP
30.0000 g | Freq: Once | ORAL | Status: AC
  Administered 2021-02-06: 30 g via ORAL
  Filled 2021-02-06: qty 120

## 2021-02-06 MED ORDER — SODIUM CHLORIDE 0.9 % IV SOLN
10.0000 mL/h | Freq: Once | INTRAVENOUS | Status: AC
  Administered 2021-02-06: 10 mL/h via INTRAVENOUS

## 2021-02-06 MED ORDER — DARBEPOETIN ALFA 150 MCG/0.3ML IJ SOSY: 150.0000 ug | PREFILLED_SYRINGE | INTRAMUSCULAR | Status: DC

## 2021-02-06 MED ORDER — CHLORHEXIDINE GLUCONATE CLOTH 2 % EX PADS
6.0000 | MEDICATED_PAD | Freq: Every day | CUTANEOUS | Status: DC
  Administered 2021-02-07: 6 via TOPICAL

## 2021-02-06 MED ORDER — LEVOTHYROXINE SODIUM 50 MCG PO TABS
50.0000 ug | ORAL_TABLET | Freq: Every day | ORAL | Status: DC
  Administered 2021-02-07: 50 ug via ORAL
  Filled 2021-02-06: qty 1

## 2021-02-06 MED ORDER — ALLOPURINOL 100 MG PO TABS
100.0000 mg | ORAL_TABLET | Freq: Every day | ORAL | Status: DC
  Administered 2021-02-07: 100 mg via ORAL
  Filled 2021-02-06: qty 1

## 2021-02-06 MED ORDER — CALCITRIOL 0.25 MCG PO CAPS
0.2500 ug | ORAL_CAPSULE | Freq: Every day | ORAL | Status: DC
  Administered 2021-02-07: 0.25 ug via ORAL
  Filled 2021-02-06: qty 1

## 2021-02-06 MED ORDER — HYDROCODONE-ACETAMINOPHEN 5-325 MG PO TABS
1.0000 | ORAL_TABLET | Freq: Four times a day (QID) | ORAL | Status: DC | PRN
  Administered 2021-02-07: 1 via ORAL
  Filled 2021-02-06: qty 1

## 2021-02-06 MED ORDER — FERROUS SULFATE 325 (65 FE) MG PO TABS
325.0000 mg | ORAL_TABLET | Freq: Every day | ORAL | Status: DC
  Filled 2021-02-06: qty 1

## 2021-02-06 MED ORDER — CINACALCET HCL 30 MG PO TABS: 30.0000 mg | ORAL_TABLET | Freq: Every day | ORAL | Status: DC

## 2021-02-06 MED ORDER — PRAVASTATIN SODIUM 40 MG PO TABS: 40.0000 mg | ORAL_TABLET | Freq: Every day | ORAL | Status: DC

## 2021-02-06 MED ORDER — FLUOXETINE HCL 20 MG PO TABS: 10.0000 mg | ORAL_TABLET | Freq: Every day | ORAL | Status: DC

## 2021-02-06 MED ORDER — AMITRIPTYLINE HCL 50 MG PO TABS
75.0000 mg | ORAL_TABLET | Freq: Every day | ORAL | Status: DC
  Filled 2021-02-06: qty 3

## 2021-02-06 MED ORDER — SEVELAMER CARBONATE 800 MG PO TABS
3200.0000 mg | ORAL_TABLET | Freq: Three times a day (TID) | ORAL | Status: DC
  Administered 2021-02-06 – 2021-02-07 (×2): 3200 mg via ORAL
  Filled 2021-02-06 (×3): qty 4

## 2021-02-06 MED ORDER — FAMOTIDINE 20 MG PO TABS
20.0000 mg | ORAL_TABLET | ORAL | Status: DC
  Filled 2021-02-06: qty 1

## 2021-02-06 MED ORDER — SODIUM CHLORIDE 0.9 % IV BOLUS: 500.0000 mL | Freq: Once | INTRAVENOUS | Status: DC

## 2021-02-06 MED ORDER — CALCITRIOL 0.25 MCG PO CAPS
0.2500 ug | ORAL_CAPSULE | Freq: Every day | ORAL | Status: DC
  Filled 2021-02-06: qty 1

## 2021-02-06 NOTE — H&P (Signed)
History and Physical    Colin [name] ZOX:[id number] DOB: [birth year] DOA: 02/06/2021  PCP: Gaynelle Arabian, MD   Patient coming from: Home    Chief Complaint: Bleeding from fistula site, dizziness  HPI: Colin [name] is a 55 y.o. male with medical history significant of ESRD on home hemo-dialysis, chronic anemia, gout, ulcerative colitis who presented to the emergency department with complaints of weakness, dizziness after sustaining bleeding from his dialysis access. Patient is on home dialysis.  As per the report, he was setting up his machine for dialysis at home then suddenly the needle became dislodged from the AV fistula on the left arm and he had prolonged and heavy bleeding from the venous side.  He applied pressure on the bleeding site and it ultimately stopped but there was report that he lost about half pint of blood.  He started feeling dizziness and lightheaded and weak and presented to the emergency department.  There was no report of bleeding from any other site. Patient states that his blood pressure usually runs low with systolic of 81X to low 914N Patient seen and examined at the bedside this afternoon.  During my evaluation, his blood pressure still soft but had improved .  He denied any chest pain, shortness of breath, palpitations, abdomen pain, nausea, vomiting, headache, diarrhea or dysuria.  ED Course: On presentation, he was hypotensive with systolic blood pressure of 80s.  Hemoglobin was 7.1.  He was transfused with a unit of PRBC.  Lab work showed creatinine of 20, BUN of 113, potassium of 6.1.  Chest x-ray showed mild diffuse edema .  Nephrology consulted for dialysis.  Review of Systems: As per HPI otherwise 10 point review of systems negative.    Past Medical History:  Diagnosis Date  . Anemia    iron deficiency  . Arthritis    Gout  . CKD (chronic kidney disease) stage 3, GFR 30-59 ml/min (HCC)    Stage 4  . Gout   . History of chicken pox   . Hypertension    . Melanosis coli   . Mumps   . Peripheral neuropathy   . Pneumonia   . Ulcerative colitis [place], [place]    Past Surgical History:  Procedure Laterality Date  . AV FISTULA PLACEMENT Right 08/27/2016   Procedure: RIGHT RADIOCEPHALIC ARTERIOVENOUS FISTULA CREATION;  Surgeon: [name], MD;  Location: [facility] OR;  Service: Vascular;  Laterality: Right;  . AV FISTULA PLACEMENT Right 10/21/2017   Procedure: ARTERIOVENOUS (AV) FISTULA CREATION RIGHT ARM;  Surgeon: [name], MD;  Location: [facility];  Service: Vascular;  Laterality: Right;  . COLONOSCOPY    . FISTULA SUPERFICIALIZATION Right 11/12/2016   Procedure: FISTULA SUPERFICIALIZATION RIGHT FOREEARM;  Surgeon: [name], MD;  Location: [facility] OR;  Service: Vascular;  Laterality: Right;  . REVISON OF ARTERIOVENOUS FISTULA Right 12/23/2017   Procedure: CEPHALIC VEIN TRANSPOSITION ARTERIOVENOUS FISTULA RIGHT UPPER ARM;  Surgeon: [name], MD;  Location: [facility] OR;  Service: Vascular;  Laterality: Right;  . TENDON REPAIR  1987   Left index finger     reports that he has never smoked. He has quit using smokeless tobacco.  His smokeless tobacco use included chew. He reports current alcohol use. He reports that he does not use drugs.  Allergies  Allergen Reactions  . Sulfa Antibiotics Shortness Of Breath and Rash    Hives, itching    Family History  Problem Relation Age of Onset  . Hypertension  Father   . Heart disease Father   . Renal Disease Father   . Hypertension Mother      Prior to Admission medications   Medication Sig Start Date End Date Taking? Authorizing Provider  allopurinol (ZYLOPRIM) 100 MG tablet Take 100 mg by mouth daily.    [provider]  amitriptyline (ELAVIL) 25 MG tablet Take 75 mg by mouth at bedtime.    [provider]  atenolol (TENORMIN) 50 MG tablet Take 50 mg by mouth daily as needed.    [provider]  calcitRIOL (ROCALTROL) 0.25 MCG capsule Take 0.25 mcg by mouth  daily.    [provider]  cinacalcet (SENSIPAR) 30 MG tablet Take 30 mg by mouth daily.    [provider]  ferrous sulfate 325 (65 FE) MG tablet Take 325 mg by mouth at bedtime.    [provider]  FLUoxetine (PROZAC) 10 MG tablet Take 10 mg by mouth daily.    [provider]  HYDROcodone-acetaminophen (NORCO/VICODIN) 5-325 MG tablet Take 1 tablet by mouth every 6 (six) hours as needed for severe pain. 12/10/20   Jaynee Eagles, PA-C  levothyroxine (SYNTHROID) 50 MCG tablet Take 50 mcg by mouth daily before breakfast.    [provider]  multivitamin (RENA-VIT) TABS tablet Take 1 tablet by mouth at bedtime. 07/24/18   [provider]  PEPCID 20 MG tablet Take 20 mg by mouth every other day.  06/06/20   [provider]  pravastatin (PRAVACHOL) 40 MG tablet Take 40 mg by mouth at bedtime.     [provider]  sevelamer carbonate (RENVELA) 800 MG tablet Takes 6 tablets TID with meals PRN and 4 tablets with snacks BID PRN    [provider]  traMADol (ULTRAM) 50 MG tablet Take by mouth every 6 (six) hours as needed.    [provider]    Physical Exam: Vitals:   02/06/21 1610 02/06/21 1615 02/06/21 1630 02/06/21 1645  BP: (!) 86/51 (!) 85/53 (!) 91/57 (!) 96/55  Pulse: 77 75 74 72  Resp: 19 15 16    Temp: 98 F (36.7 C)     TempSrc: Oral     SpO2: 98% 100% 100% 99%    Constitutional: NAD, calm, comfortable,morbidly obese Vitals:   02/06/21 1610 02/06/21 1615 02/06/21 1630 02/06/21 1645  BP: (!) 86/51 (!) 85/53 (!) 91/57 (!) 96/55  Pulse: 77 75 74 72  Resp: 19 15 16    Temp: 98 F (36.7 C)     TempSrc: Oral     SpO2: 98% 100% 100% 99%   Eyes: PERRL, lids and conjunctivae normal ENMT: Mucous membranes are moist.  Neck: normal, supple, no masses, no thyromegaly Respiratory: clear to auscultation bilaterally, no wheezing, no crackles. Normal respiratory effort. No accessory muscle use.  Cardiovascular:  Regular rate and rhythm, no murmurs / rubs / gallops. No extremity edema.  Abdomen: no tenderness, no masses palpated. No hepatosplenomegaly. Bowel sounds positive.  Musculoskeletal: no clubbing / cyanosis. No joint deformity upper and lower extremities.  AV fistula on the left forearm Skin: no rashes, lesions, ulcers. No induration Neurologic: CN 2-12 grossly intact.  Strength 5/5 in all 4.  Psychiatric: Normal judgment and insight. Alert and oriented x 3. Normal mood.   Foley Catheter:None  Labs on Admission: I have personally reviewed following labs and imaging studies  CBC: Recent Labs  Lab 02/06/21 1125  WBC 5.8  HGB 7.1*  HCT 21.8*  MCV 111.2*  PLT 213  Basic Metabolic Panel: Recent Labs  Lab 02/06/21 1125  NA 134*  K 6.1*  CL 97*  CO2 21*  GLUCOSE 96  BUN 113*  CREATININE 20.22*  CALCIUM 8.4*   GFR: CrCl cannot be calculated (Unknown ideal weight.). Liver Function Tests: No results for input(s): AST, ALT, ALKPHOS, BILITOT, PROT, ALBUMIN in the last 168 hours. No results for input(s): LIPASE, AMYLASE in the last 168 hours. No results for input(s): AMMONIA in the last 168 hours. Coagulation Profile: No results for input(s): INR, PROTIME in the last 168 hours. Cardiac Enzymes: No results for input(s): CKTOTAL, CKMB, CKMBINDEX, TROPONINI in the last 168 hours. BNP (last 3 results) No results for input(s): PROBNP in the last 8760 hours. HbA1C: No results for input(s): HGBA1C in the last 72 hours. CBG: No results for input(s): GLUCAP in the last 168 hours. Lipid Profile: No results for input(s): CHOL, HDL, LDLCALC, TRIG, CHOLHDL, LDLDIRECT in the last 72 hours. Thyroid Function Tests: No results for input(s): TSH, T4TOTAL, FREET4, T3FREE, THYROIDAB in the last 72 hours. Anemia Panel: No results for input(s): VITAMINB12, FOLATE, FERRITIN, TIBC, IRON, RETICCTPCT in the last 72 hours. Urine analysis:    Component Value Date/Time   COLORURINE YELLOW 11/18/2020  1945   APPEARANCEUR CLEAR 11/18/2020 1945   LABSPEC 1.025 11/18/2020 1945   PHURINE 5.5 11/18/2020 1945   GLUCOSEU NEGATIVE 11/18/2020 1945   HGBUR SMALL (A) 11/18/2020 1945   BILIRUBINUR NEGATIVE 11/18/2020 1945   KETONESUR NEGATIVE 11/18/2020 1945   PROTEINUR 30 (A) 11/18/2020 1945   UROBILINOGEN 0.2 11/14/2010 1927   NITRITE NEGATIVE 11/18/2020 1945   LEUKOCYTESUR TRACE (A) 11/18/2020 1945    Radiological Exams on Admission: DG Chest 2 View  Result Date: 02/06/2021 CLINICAL DATA:  Missed dialysis.  Weakness EXAM: CHEST - 2 VIEW COMPARISON:  03/16/2019 FINDINGS: Stable cardiomegaly. Mild diffuse interstitial prominence. No lobar consolidation. No pleural effusion. No pneumothorax. IMPRESSION: Cardiomegaly with mild diffuse interstitial prominence which may reflect mild edema. Electronically Signed   By: Davina Poke D.O.   On: 02/06/2021 12:30     Assessment/Plan Active Problems:   Acute blood loss anemia   Acute blood loss anemia/acute on chronic microcytic anemia: Symptomatic anemia from blood loss from dialysis access site.  Report of dislodgment of the needle while attempting hemodialysis at home.  Apparently the bleeding had stopped after application of pressure but the bleeding was significant and he became dizzy and weak.  On presentation he was hypotensive. Patient was transfused with a unit of PRBC.  We will continue to monitor H&H.  Blood pressure has improved.   Patient has chronic anemia secondary to chronic kidney disease.  Also on iron supplementation at home. Will check vitamin ,folic acid  ESRD/anion gap metabolic acidosis: On home hemodialysis.  Dialyzed 4 times per week on Monday, Tuesday, Thursday and Friday.  Nephrology following for dialysis. Chest x-ray showed features of volume overload.  Respiratory status is stable.  Hyperkalemia: Potassium was elevated at 6.1.  We will give him a dose of Kayexalate, nephrology planning for initiating dialysis  today  Hypotension: Secondary to acute blood loss anemia.  Blood pressure improved after he was given blood.  Monitor blood pressure in telemetry.  Avoid antihypertensives  Secondary hyperparathyroidism: Continue phosphate binders, calcitriol.  Hypothyroidism: On Synthyroid  Hyperlipidemia: On pravastatin  History of depression: On Prozac  Severity of Illness: The appropriate patient status for this patient is OBSERVATION    DVT prophylaxis: SCD Code Status: Full Family Communication: None at bedside  Consults called: nephrology following     Shelly Coss MD [facility]  02/06/2021, 5:02 PM

## 2021-02-06 NOTE — ED Provider Notes (Signed)
  Face-to-face evaluation   History: Patient here because of dizziness, following attempt at home dialysis, when he was unable to place the dialysis needle in the fistula.  During this ED had a large amount of bleeding, seen by the patient and his wife.  He denies chest pain, shortness of breath, focal weakness or paresthesia.  He has not had any fever.  He was treated for hyperkalemia recently with oral medication.  He has been on home dialysis, for 5 months.  Physical exam: Overweight middle-age male, was alert and cooperative.  No respiratory distress.  Left arm fistula is not currently bleeding.  Medical screening examination/treatment/procedure(s) were conducted as a shared visit with non-physician practitioner(s) and myself.  I personally evaluated the patient during the encounter    Daleen Bo, MD 02/07/21 [phone]

## 2021-02-06 NOTE — ED Notes (Signed)
Attempted to call report to 5N.

## 2021-02-06 NOTE — Consult Note (Signed)
[facility] Renal Consultation Note    Indication for Consultation:  Management of ESRD/hemodialysis, anemia, hypertension/volume, and secondary hyperparathyroidism.  HPI: Colin [name] is a 55 y.o. male was ESRD on home dialysis Monday, Tuesday, Thursday, Friday, hypertension, anemia, gout, and ulcerative colitis.  He reports he was setting up his machine for dialysis today when his venous needle became dislodged and he had prolonged bleeding from his venous site.  Reports bleeding was eventually stopped with pressure but he lost a large amount of blood, estimated to be about half a quart.  Afterwards he felt dizzy and weak and was advised to come to the emergency room. He reports his hemoglobin is low at baseline recently. Most recent outpatient Hgb 8.9 on 02/02/21. Denies any other known blood loss including melena and hematochezia. He does note that he has generally been tired and weak lately. He fatigues easily and has dyspnea on exertion. Reports fatigue started when he had COVID last year but seems to be worse over the past few months. He also notes periods after he eats when he feels nausea and sometimes "blacks out," for a second. Reports he was told symptoms were GERD related. He was referred to cardiology for tachycardia and fatigue but reports his stress test was normal. At present, pt denies any SOB, CP, palpitations, dizziness, abdominal pain, and nausea. No peripheral edema, fevers or chills.   ED course notable for hypotension with BP in the 73'S systolic, Hgb 7.1, K+ 6.1, BUN 113, Cr 20.22, CO2 21. Baseline creatinine appears to be around 15. Chest x-ray showed mild diffuse edema. Nephrology consulted for management of ESRD.   Past Medical History:  Diagnosis Date  . Anemia    iron deficiency  . Arthritis    Gout  . CKD (chronic kidney disease) stage 3, GFR 30-59 ml/min (HCC)    Stage 4  . Gout   . History of chicken pox   . Hypertension   . Melanosis coli   . Mumps    . Peripheral neuropathy   . Pneumonia   . Ulcerative colitis [place], [place]   Past Surgical History:  Procedure Laterality Date  . AV FISTULA PLACEMENT Right 08/27/2016   Procedure: RIGHT RADIOCEPHALIC ARTERIOVENOUS FISTULA CREATION;  Surgeon: [name], MD;  Location: [facility] OR;  Service: Vascular;  Laterality: Right;  . AV FISTULA PLACEMENT Right 10/21/2017   Procedure: ARTERIOVENOUS (AV) FISTULA CREATION RIGHT ARM;  Surgeon: [name], MD;  Location: [place];  Service: Vascular;  Laterality: Right;  . COLONOSCOPY    . FISTULA SUPERFICIALIZATION Right 11/12/2016   Procedure: FISTULA SUPERFICIALIZATION RIGHT FOREEARM;  Surgeon: [name], MD;  Location: [facility] OR;  Service: Vascular;  Laterality: Right;  . REVISON OF ARTERIOVENOUS FISTULA Right 12/23/2017   Procedure: CEPHALIC VEIN TRANSPOSITION ARTERIOVENOUS FISTULA RIGHT UPPER ARM;  Surgeon: [name], MD;  Location: [facility] OR;  Service: Vascular;  Laterality: Right;  . TENDON REPAIR  1987   Left index finger   Family History  Problem Relation Age of Onset  . Hypertension Father   . Heart disease Father   . Renal Disease Father   . Hypertension Mother    Social History:  reports that he has never smoked. He has quit using smokeless tobacco.  His smokeless tobacco use included chew. He reports current alcohol use. He reports that he does not use drugs.  ROS: As per HPI otherwise negative.  Physical Exam: Vitals:   02/06/21 1530 02/06/21  1545 02/06/21 1554 02/06/21 1600  BP: (!) 83/66 (!) 88/44 (!) 88/50 (!) 75/47  Pulse: 73 (!) 109 76 (!) 135  Resp: 16  18 19   Temp:   97.8 F (36.6 C)   TempSrc:   Oral   SpO2: 98% (!) 10%  94%     General: Well developed, well nourished, in no acute distress. Head: Normocephalic, atraumatic, sclera non-icteric, mucus membranes are moist. Neck: JVD not elevated. Lungs: Clear bilaterally to auscultation without wheezes, rales, or rhonchi. Breathing is unlabored on RA Heart: RRR  with normal S1, S2. No murmurs, rubs, or gallops appreciated. Abdomen: Soft, non-tender, non-distended with normoactive bowel sounds. No rebound/guarding. No obvious abdominal masses. Musculoskeletal:  Strength and tone appear normal for age. Lower extremities: No edema bilateral lower extremities Neuro: Alert and oriented X 3. Moves all extremities spontaneously. Psych:  Responds to questions appropriately with a normal affect. Dialysis Access: LUE AVF + bruit, buttonholes, no active bleeding  Allergies  Allergen Reactions  . Sulfa Antibiotics Shortness Of Breath and Rash    Hives, itching   Prior to Admission medications   Medication Sig Start Date End Date Taking? Authorizing Provider  allopurinol (ZYLOPRIM) 100 MG tablet Take 100 mg by mouth daily.    [provider]  amitriptyline (ELAVIL) 25 MG tablet Take 75 mg by mouth at bedtime.    [provider]  atenolol (TENORMIN) 50 MG tablet Take 50 mg by mouth daily as needed.    [provider]  calcitRIOL (ROCALTROL) 0.25 MCG capsule Take 0.25 mcg by mouth daily.    [provider]  cinacalcet (SENSIPAR) 30 MG tablet Take 30 mg by mouth daily.    [provider]  ferrous sulfate 325 (65 FE) MG tablet Take 325 mg by mouth at bedtime.    [provider]  FLUoxetine (PROZAC) 10 MG tablet Take 10 mg by mouth daily.    [provider]  HYDROcodone-acetaminophen (NORCO/VICODIN) 5-325 MG tablet Take 1 tablet by mouth every 6 (six) hours as needed for severe pain. 12/10/20   Jaynee Eagles, PA-C  levothyroxine (SYNTHROID) 50 MCG tablet Take 50 mcg by mouth daily before breakfast.    [provider]  multivitamin (RENA-VIT) TABS tablet Take 1 tablet by mouth at bedtime. 07/24/18   [provider]  PEPCID 20 MG tablet Take 20 mg by mouth every other day.  06/06/20   [provider]  pravastatin (PRAVACHOL) 40 MG tablet Take 40 mg by mouth at bedtime.     [provider]  sevelamer carbonate (RENVELA) 800 MG tablet Takes 6 tablets TID with meals PRN and 4 tablets with snacks BID PRN    [provider]  traMADol (ULTRAM) 50 MG tablet Take by mouth every 6 (six) hours as needed.    [provider]   Current Facility-Administered Medications  Medication Dose Route Frequency Provider Last Rate Last Admin  . [START ON 02/07/2021] Chlorhexidine Gluconate Cloth 2 % PADS 6 each  6 each Topical Q0600 Janalee Dane, PA-C       Current Outpatient Medications  Medication Sig Dispense Refill  . allopurinol (ZYLOPRIM) 100 MG tablet Take 100 mg by mouth daily.    Marland Kitchen amitriptyline (ELAVIL) 25 MG tablet Take 75 mg by mouth at bedtime.    Marland Kitchen atenolol (TENORMIN) 50 MG tablet Take 50 mg by mouth daily as needed.    . calcitRIOL (ROCALTROL) 0.25 MCG capsule Take 0.25 mcg by mouth daily.    Marland Kitchen  cinacalcet (SENSIPAR) 30 MG tablet Take 30 mg by mouth daily.    . ferrous sulfate 325 (65 FE) MG tablet Take 325 mg by mouth at bedtime.    Marland Kitchen FLUoxetine (PROZAC) 10 MG tablet Take 10 mg by mouth daily.    Marland Kitchen HYDROcodone-acetaminophen (NORCO/VICODIN) 5-325 MG tablet Take 1 tablet by mouth every 6 (six) hours as needed for severe pain. 15 tablet 0  . levothyroxine (SYNTHROID) 50 MCG tablet Take 50 mcg by mouth daily before breakfast.    . multivitamin (RENA-VIT) TABS tablet Take 1 tablet by mouth at bedtime.  10  . PEPCID 20 MG tablet Take 20 mg by mouth every other day.     . pravastatin (PRAVACHOL) 40 MG tablet Take 40 mg by mouth at bedtime.     . sevelamer carbonate (RENVELA) 800 MG tablet Takes 6 tablets TID with meals PRN and 4 tablets with snacks BID PRN    . traMADol (ULTRAM) 50 MG tablet Take by mouth every 6 (six) hours as needed.     Labs: Basic Metabolic Panel: Recent Labs  Lab 02/06/21 1125  NA 134*  K 6.1*  CL 97*  CO2 21*  GLUCOSE 96  BUN 113*  CREATININE 20.22*  CALCIUM 8.4*   CBC: Recent Labs  Lab 02/06/21 1125  WBC 5.8   HGB 7.1*  HCT 21.8*  MCV 111.2*  PLT 213   Studies/Results: DG Chest 2 View  Result Date: 02/06/2021 CLINICAL DATA:  Missed dialysis.  Weakness EXAM: CHEST - 2 VIEW COMPARISON:  03/16/2019 FINDINGS: Stable cardiomegaly. Mild diffuse interstitial prominence. No lobar consolidation. No pleural effusion. No pneumothorax. IMPRESSION: Cardiomegaly with mild diffuse interstitial prominence which may reflect mild edema. Electronically Signed   By: Davina Poke D.O.   On: 02/06/2021 12:30    Outpatient Dialysis Orders:  NxStage on MTTF, Car 170, BFR 450, EDW 106kg, 2K 45 lactate AVF buttonholes 15g, heparin 9000 unit bolus Nephrologist: Dr. Marval Regal Mircera 227mg subq q 2 weeks- last dose 150 mch on 12/24/20 Calcitriol 0.25 mcg PO qd Ferrous sulfate 1 tab PO daily Renvela 8028m4 tabs PO with mals  Assessment/Plan: 1.  Symptomatic anemia: Prolonged bleeding from AVF today. Bleeding stopped with pressure but hemoglobin did drop from 8.7 on 4/18 to 7.1. Receiving 1 unit PRBC. Also appears he is due for ESA, will give a dose with HD today.  2.  ESRD:  On Nxstage home hemodialysis 4 days per week but last HD was Tuesday due to travel/access issues. BUN and Cr significantly elevated. K+ 6.1. Uremia may be contributing to his fatigue somewhat, but unclear given the duration of his symptoms. Will plan for hemodialysis here today.  3.  Hypertension/volume: CXR with mild edema, no evidence of volume overload on exam. BP is soft. Receiving PRBC now. Hold home BP meds, Minimal UF goal with HD given hypotension.  4.  Metabolic bone disease: Calcium 8.4. Continue home calcitriol and renvela, follow phos.  5.  Nutrition:  Recommend renal diet/fluid restrictions. Check albumin with next labs.   SaAnice PaganiniPA-C 02/06/2021, 4:04 PM  [facility] Pager: [phone]

## 2021-02-06 NOTE — ED Notes (Signed)
Attempted to call report to 2W. Reviewing chart.

## 2021-02-06 NOTE — ED Triage Notes (Signed)
Pt sent by [facility] dialysis nurse for a blood transfusion, states he lost a lot of blood while they were trying to access his fistula in his right upper arm. Since then pt states he has felt dizzy and weak. Pt a.o, ambulatory, does home dialysis 4 days a week.

## 2021-02-06 NOTE — ED Notes (Signed)
Unable to get blood draw from pt. Nurse notified

## 2021-02-06 NOTE — ED Notes (Signed)
Blood consent received & witnessed by this RN electronically.

## 2021-02-06 NOTE — ED Triage Notes (Signed)
Emergency Medicine Provider Triage Evaluation Note  Colin [name] , a 55 y.o. male  was evaluated in triage.  Pt complains of lightheadedness and increased fatigue after large amount of blood loss today during at home dialysis. Pt does at home dialysis by himself M T Th Fri. He missed yesterday's session due to being out of town. When he attempted to stick himself today for access he reports the needle must have blown and he had a large amount of blood loss - he estimates about 1/2 pint. He applied pressure and the bleeding eventually stopped however since then has felt lightheaded. He does report he normally is fatigued however it has been worse since this incident. He called the dialysis center and they advised he come to the ED for further eval and possible blood transfusion. Pt is unsure what his baseline hgb is. His nephrologist is Dr. Meredeth Ide.  Review of Systems  Positive: + fatigue, + lightheadedness Negative: - chest pain, - worsening SOB  Physical Exam  BP 108/75   Pulse 80   Temp 97.9 F (36.6 C) (Oral)   Resp 17   SpO2 100%  Gen:   Awake, no distress   HEENT:  Atraumatic  Resp:  Normal effort  Cardiac:  Normal rate  Abd:   Nondistended, nontender  MSK:   Moves extremities without difficulty. Right AC fistula with good thrill; not actively bleeding Neuro:  Speech clear   Medical Decision Making  Medically screening exam initiated at 11:34 AM.  Appropriate orders placed.  Colin [name] was informed that the remainder of the evaluation will be completed by another provider, this initial triage assessment does not replace that evaluation, and the importance of remaining in the ED until their evaluation is complete.  Clinical Impression  55 year old male with hx of dialysis M T Th Fr who presents for increased fatigue and lightheadedness after home dialysis today; did not complete session. Did not complete yesterdays session. Will need labs and type and screen, EKG, CXR. Pt is  medically screened and appropriate for the waiting room.    Eustaquio Maize, PA-C 02/06/21 1138

## 2021-02-06 NOTE — ED Notes (Signed)
Report was given to [facility] RN, this RN stated that he will be ready for pt in approx. 2 hours.

## 2021-02-06 NOTE — ED Provider Notes (Signed)
[facility] EMERGENCY DEPARTMENT Provider Note   CSN: [id number] Arrival date & time: 02/06/21  1106     History Chief Complaint  Patient presents with  . Dizziness    Colin [name] is a 55 y.o. male.  HPI 55 year old male with a history of anemia, CKD stage IV, hemodialysis, pneumonia, hypertension presents to the ER with complaints of dizziness fatigue after large amount of blood loss today during home dialysis.  Patient does home dialysis [facility] Fri.  Missed yesterday session due to being out of town.  He attempted to stick himself today for access and reports that the needle must of blown as he had a large amount of blood loss, admitting about half a pint.  He applied pressure and the bleeding eventually stopped.  He however felt lightheaded and fatigued, which is not uncommon for him but this is worse than his baseline.  He called the dialysis center and they advised him to come to the ER for possible blood transfusion.  He is followed by Dr. Meredeth Ide.  On arrival to the ER, patient's fistula is not bleeding.    Past Medical History:  Diagnosis Date  . Anemia    iron deficiency  . Arthritis    Gout  . CKD (chronic kidney disease) stage 3, GFR 30-59 ml/min (HCC)    Stage 4  . Gout   . History of chicken pox   . Hypertension   . Melanosis coli   . Mumps   . Peripheral neuropathy   . Pneumonia   . Ulcerative colitis [place], [place]    Patient Active Problem List   Diagnosis Date Noted  . Acute blood loss anemia 02/06/2021  . Pneumonia due to COVID-19 virus 03/10/2019  . Macrocytic anemia 03/10/2019  . Acute hypoxemic respiratory failure ([place])   . Hyperprolactinemia ([place]) 01/17/2018  . Screening for malignant neoplasm of prostate 01/17/2018  . Gynecomastia 01/17/2018  . Ingrown toenail 06/21/2017  . Onychomycosis 06/21/2017  . ESRD (end stage renal disease) ([place]) 02/07/2017  . Morbid obesity ([place]) 02/07/2017  . Bilateral leg edema  10/02/2015  . Hyperlipidemia 10/02/2015  . Edema 02/09/2011  . Chronic renal insufficiency 02/09/2011  . ANEMIA, IRON DEFICIENCY 10/16/2007  . Essential hypertension 10/16/2007  . Ulcerative colitis ([place]) 02/13/2007  . MELANOSIS COLI 02/13/2007    Past Surgical History:  Procedure Laterality Date  . AV FISTULA PLACEMENT Right 08/27/2016   Procedure: RIGHT RADIOCEPHALIC ARTERIOVENOUS FISTULA CREATION;  Surgeon: [name], MD;  Location: [facility] OR;  Service: Vascular;  Laterality: Right;  . AV FISTULA PLACEMENT Right 10/21/2017   Procedure: ARTERIOVENOUS (AV) FISTULA CREATION RIGHT ARM;  Surgeon: [name], MD;  Location: [place];  Service: Vascular;  Laterality: Right;  . COLONOSCOPY    . FISTULA SUPERFICIALIZATION Right 11/12/2016   Procedure: FISTULA SUPERFICIALIZATION RIGHT FOREEARM;  Surgeon: [name], MD;  Location: [facility] OR;  Service: Vascular;  Laterality: Right;  . REVISON OF ARTERIOVENOUS FISTULA Right 12/23/2017   Procedure: CEPHALIC VEIN TRANSPOSITION ARTERIOVENOUS FISTULA RIGHT UPPER ARM;  Surgeon: [name], MD;  Location: [facility] OR;  Service: Vascular;  Laterality: Right;  . TENDON REPAIR  1987   Left index finger       Family History  Problem Relation Age of Onset  . Hypertension Father   . Heart disease Father   . Renal Disease Father   . Hypertension Mother     Social History   Tobacco Use  .  Smoking status: Never Smoker  . Smokeless tobacco: Former Systems developer    Types: Secondary school teacher  . Vaping Use: Never used  Substance Use Topics  . Alcohol use: Yes    Comment: rare  . Drug use: No    Home Medications Prior to Admission medications   Medication Sig Start Date End Date Taking? Authorizing Provider  allopurinol (ZYLOPRIM) 100 MG tablet Take 100 mg by mouth daily.    [provider]  amitriptyline (ELAVIL) 25 MG tablet Take 75 mg by mouth at bedtime.    [provider]  atenolol (TENORMIN) 50 MG tablet Take 50 mg by mouth daily as  needed.    [provider]  calcitRIOL (ROCALTROL) 0.25 MCG capsule Take 0.25 mcg by mouth daily.    [provider]  cinacalcet (SENSIPAR) 30 MG tablet Take 30 mg by mouth daily.    [provider]  ferrous sulfate 325 (65 FE) MG tablet Take 325 mg by mouth at bedtime.    [provider]  FLUoxetine (PROZAC) 10 MG tablet Take 10 mg by mouth daily.    [provider]  HYDROcodone-acetaminophen (NORCO/VICODIN) 5-325 MG tablet Take 1 tablet by mouth every 6 (six) hours as needed for severe pain. 12/10/20   Jaynee Eagles, PA-C  levothyroxine (SYNTHROID) 50 MCG tablet Take 50 mcg by mouth daily before breakfast.    [provider]  multivitamin (RENA-VIT) TABS tablet Take 1 tablet by mouth at bedtime. 07/24/18   [provider]  PEPCID 20 MG tablet Take 20 mg by mouth every other day.  06/06/20   [provider]  pravastatin (PRAVACHOL) 40 MG tablet Take 40 mg by mouth at bedtime.     [provider]  sevelamer carbonate (RENVELA) 800 MG tablet Takes 6 tablets TID with meals PRN and 4 tablets with snacks BID PRN    [provider]  traMADol (ULTRAM) 50 MG tablet Take by mouth every 6 (six) hours as needed.    [provider]    Allergies    Sulfa antibiotics  Review of Systems   Review of Systems  Constitutional: Positive for fatigue. Negative for chills and fever.  HENT: Negative for ear pain and sore throat.   Eyes: Negative for pain and visual disturbance.  Respiratory: Negative for cough and shortness of breath.   Cardiovascular: Negative for chest pain and palpitations.  Gastrointestinal: Negative for abdominal pain and vomiting.  Genitourinary: Negative for dysuria and hematuria.  Musculoskeletal: Negative for arthralgias and back pain.  Skin: Negative for color change and rash.  Neurological: Positive for dizziness. Negative for seizures and syncope.  All other systems reviewed and are  negative.   Physical Exam Updated Vital Signs BP (!) 96/55   Pulse 72   Temp 98 F (36.7 C) (Oral)   Resp 16   SpO2 99%   Physical Exam Vitals and nursing note reviewed.  Constitutional:      Appearance: He is well-developed.  HENT:     Head: Normocephalic and atraumatic.  Eyes:     Conjunctiva/sclera: Conjunctivae normal.  Cardiovascular:     Rate and Rhythm: Normal rate and regular rhythm.     Heart sounds: No murmur heard.   Pulmonary:     Effort: Pulmonary effort is normal. No respiratory distress.     Breath sounds: Normal breath sounds.  Abdominal:     Palpations: Abdomen is soft.     Tenderness: There is no abdominal tenderness.  Musculoskeletal:  General: Normal range of motion.     Cervical back: Neck supple.     Comments: Right arm fistula with no evidence of bleeding, palpable thrill  Skin:    General: Skin is warm and dry.  Neurological:     General: No focal deficit present.     Mental Status: He is alert and oriented to person, place, and time.  Psychiatric:        Mood and Affect: Mood normal.        Behavior: Behavior normal.     ED Results / Procedures / Treatments   Labs (all labs ordered are listed, but only abnormal results are displayed) Labs Reviewed  BASIC METABOLIC PANEL - Abnormal; Notable for the following components:      Result Value   Sodium 134 (*)    Potassium 6.1 (*)    Chloride 97 (*)    CO2 21 (*)    BUN 113 (*)    Creatinine, Ser 20.22 (*)    Calcium 8.4 (*)    GFR, Estimated 2 (*)    Anion gap 16 (*)    All other components within normal limits  CBC - Abnormal; Notable for the following components:   RBC 1.96 (*)    Hemoglobin 7.1 (*)    HCT 21.8 (*)    MCV 111.2 (*)    MCH 36.2 (*)    All other components within normal limits  RESP PANEL BY RT-PCR (FLU A&B, COVID) ARPGX2  BASIC METABOLIC PANEL  CBC  TYPE AND SCREEN  PREPARE RBC (CROSSMATCH)    EKG None  Radiology DG Chest 2 View  Result Date:  02/06/2021 CLINICAL DATA:  Missed dialysis.  Weakness EXAM: CHEST - 2 VIEW COMPARISON:  03/16/2019 FINDINGS: Stable cardiomegaly. Mild diffuse interstitial prominence. No lobar consolidation. No pleural effusion. No pneumothorax. IMPRESSION: Cardiomegaly with mild diffuse interstitial prominence which may reflect mild edema. Electronically Signed   By: Davina Poke D.O.   On: 02/06/2021 12:30    Procedures Procedures   Medications Ordered in ED Medications  Chlorhexidine Gluconate Cloth 2 % PADS 6 each (has no administration in time range)  Darbepoetin Alfa (ARANESP) injection 150 mcg (has no administration in time range)  sevelamer carbonate (RENVELA) tablet 3,200 mg (has no administration in time range)  allopurinol (ZYLOPRIM) tablet 100 mg (has no administration in time range)  HYDROcodone-acetaminophen (NORCO/VICODIN) 5-325 MG per tablet 1 tablet (has no administration in time range)  pravastatin (PRAVACHOL) tablet 40 mg (has no administration in time range)  amitriptyline (ELAVIL) tablet 75 mg (has no administration in time range)  FLUoxetine (PROZAC) tablet 10 mg (has no administration in time range)  calcitRIOL (ROCALTROL) capsule 0.25 mcg (has no administration in time range)  cinacalcet (SENSIPAR) tablet 30 mg (has no administration in time range)  levothyroxine (SYNTHROID) tablet 50 mcg (has no administration in time range)  famotidine (PEPCID) tablet 20 mg (has no administration in time range)  ferrous sulfate tablet 325 mg (has no administration in time range)  0.9 %  sodium chloride infusion (10 mL/hr Intravenous New Bag/Given (Non-Interop) 02/06/21 1553)    ED Course  I have reviewed the triage vital signs and the nursing notes.  Pertinent labs & imaging results that were available during my care of the patient were reviewed by me and considered in my medical decision making (see chart for details).    MDM Rules/Calculators/A&P  55 year old  male with dizziness and fatigue after losing a lot of blood when trying to perform home dialysis.  On arrival, blood pressure soft with a blood pressure of 98/65, which progressively decreased throughout his ED stay.  His hemoglobin today is 7.1, which is a point lower than it was several days ago.  His BMP shows a potassium of 6.1, and a creatinine of 20.22 which is markedly elevated from his prior values.  BUN of 113 and anion gap of 16.  Chest x-ray with mild edema, but not significantly greater from his baseline.  Patient was typed and crossed, and received 1 unit of blood.  I did speak with Dr. Melvia Heaps with nephrology who recommends an additional 500 mL bolus of fluids given hypotension.  Nephrology saw and evaluated the patient, recommends dialysis today and admission.  Consulted Dr. Tawanna Solo  with the hospitalist team will admit the patient for further evaluation and treatment.  His blood pressure is improving with fluids and blood.  This was a shared visit with my supervising physician Dr. Eulis Foster who independently saw and evaluated the patient & provided guidance in evaluation/management/disposition ,in agreement with care  Final Clinical Impression(s) / ED Diagnoses Final diagnoses:  Anemia requiring transfusions  Admission for dialysis [facility])    Rx / DC Orders ED Discharge Orders    None       Lyndel Safe 02/06/21 1712    Daleen Bo, MD 02/07/21 [phone]

## 2021-02-07 DIAGNOSIS — D62 Acute posthemorrhagic anemia: Secondary | ICD-10-CM | POA: Diagnosis not present

## 2021-02-07 LAB — BASIC METABOLIC PANEL
Anion gap: 14 (ref 5–15)
BUN: 47 mg/dL — ABNORMAL HIGH (ref 6–20)
CO2: 26 mmol/L (ref 22–32)
Calcium: 8.7 mg/dL — ABNORMAL LOW (ref 8.9–10.3)
Chloride: 94 mmol/L — ABNORMAL LOW (ref 98–111)
Creatinine, Ser: 11.37 mg/dL — ABNORMAL HIGH (ref 0.61–1.24)
GFR, Estimated: 5 mL/min — ABNORMAL LOW (ref >60)
Glucose, Bld: 134 mg/dL — ABNORMAL HIGH (ref 70–99)
Potassium: 3.6 mmol/L (ref 3.5–5.1)
Sodium: 134 mmol/L — ABNORMAL LOW (ref 135–145)

## 2021-02-07 LAB — VITAMIN B12: Vitamin B-12: 1273 pg/mL — ABNORMAL HIGH (ref 180–914)

## 2021-02-07 LAB — FOLATE: Folate: 22 ng/mL (ref >5.9)

## 2021-02-07 LAB — CBC
HCT: 23.5 % — ABNORMAL LOW (ref 39.0–52.0)
Hemoglobin: 8.2 g/dL — ABNORMAL LOW (ref 13.0–17.0)
MCH: 35.8 pg — ABNORMAL HIGH (ref 26.0–34.0)
MCHC: 34.9 g/dL (ref 30.0–36.0)
MCV: 102.6 fL — ABNORMAL HIGH (ref 80.0–100.0)
Platelets: 214 10*3/uL (ref 150–400)
RBC: 2.29 MIL/uL — ABNORMAL LOW (ref 4.22–5.81)
RDW: 15.8 % — ABNORMAL HIGH (ref 11.5–15.5)
WBC: 6.6 10*3/uL (ref 4.0–10.5)
nRBC: 0 % (ref 0.0–0.2)

## 2021-02-07 LAB — TYPE AND SCREEN
ABO/RH(D): A POS
Antibody Screen: NEGATIVE
Unit division: 0

## 2021-02-07 LAB — BPAM RBC
Blood Product Expiration Date: 202205222359
ISSUE DATE / TIME: 202204221534
Unit Type and Rh: 6200

## 2021-02-07 MED ORDER — SEVELAMER CARBONATE 800 MG PO TABS: 3200.0000 mg | ORAL_TABLET | Freq: Three times a day (TID) | ORAL | 0 refills | Status: AC

## 2021-02-07 NOTE — Progress Notes (Signed)
[facility] Progress Note   Subjective: in good spirits, hb 8.3, no AVF bleeding overnight  Vitals:   02/07/21 0052 02/07/21 0122 02/07/21 0152 02/07/21 0300  BP: (!) 94/59 (!) 93/55 (!) 88/52 (!) 108/55  Pulse: 81  84   Resp: (!) 21 (!) 23 (!) 24   Temp:   98.4 F (36.9 C)   TempSrc:   Oral   SpO2: 99%  100%   Height:        Exam:  alert, nad   no jvd  Chest cta bilat  Cor reg no RG  Abd soft ntnd no ascites   Ext no LE edema   Alert, NF, ox3   LUE AVF +bruit   OP HD:  NxStage MTTF, Car 170, BFR 450, EDW 106kg, 2K 45 lactate AVF buttonholes 15g, heparin 9000 unit bolus Mircera 24mg subq q 2 weeks- last dose 150 mch on 12/24/20 Calcitriol 0.25 mcg PO qd Renvela 8033m4 tabs PO with mals  Assessment/Plan: 1.  Symptomatic anemia: Prolonged bleeding from AVF on 4/22 at home. Bleeding stopped with pressure but hemoglobin did drop to 7.1. Received 1 unit PRBC 4/22. Not on any anticoagulants. AVF showed mild aneurysms w/o any thinning or ulceration.  Hb up to 8.3 today. No further bleeding. OK for dc from renal standpoint.  2.  ESRD:  On Nxstage home hemodialysis 4 days per week but missed Thursday HD d/t travelling. B/ Cr down today after HD here yesterday. Will resume home HD per the patient.  3.  Hypertension/volume: CXR vasc congestion, no evidence of volume overload on exam. BP is soft. Receiving PRBC now. Holding BP meds. Resume as needed at home.  4.  Metabolic bone disease: Calcium 8.4. Continue home calcitriol and renvela, follow phos.  5.  Nutrition:  Recommend renal diet/fluid restrictions. Check albumin with next labs.  6. Dispo - as above, ok for d/c today      Rob Christino Mcglinchey 02/07/2021, 11:28 AM   Recent Labs  Lab 02/06/21 1125 02/07/21 0415  K 6.1* 3.6  BUN 113* 47*  CREATININE 20.22* 11.37*  CALCIUM 8.4* 8.7*  HGB 7.1* 8.2*   Inpatient medications: . allopurinol  100 mg Oral Daily  . amitriptyline  75 mg Oral QHS  . calcitRIOL  0.25  mcg Oral Daily  . Chlorhexidine Gluconate Cloth  6 each Topical Q0600  . darbepoetin (ARANESP) injection - DIALYSIS  150 mcg Intravenous Q Fri-HD  . famotidine  20 mg Oral QODAY  . ferrous sulfate  325 mg Oral QHS  . levothyroxine  50 mcg Oral QAC breakfast  . pravastatin  40 mg Oral QHS  . sevelamer carbonate  3,200 mg Oral TID WC    HYDROcodone-acetaminophen, ondansetron (ZOFRAN) IV

## 2021-02-07 NOTE — Discharge Summary (Signed)
Physician Discharge Summary  Colin [name] CHE:[id number] DOB: [birth year] DOA: 02/06/2021  PCP: Gaynelle Arabian, MD  Admit date: 02/06/2021 Discharge date: 02/07/2021  Admitted From: Home Disposition: Home  Recommendations for Outpatient Follow-up:  1. Follow up with PCP in 1-2 weeks 2. Please obtain BMP/CBC in one week 3. Please follow up with nephrology as scheduled  Home Health: None Equipment/Devices: None  Discharge Condition: Stable CODE STATUS: Full Diet recommendation: Renal diet  Brief/Interim Summary: Colin [name] a 55 y.o.malewith medical history significant ofESRD on home hemo-dialysis, chronic anemia, gout, ulcerative colitis who presented to the emergency department with complaints of weakness, dizziness after sustaining bleeding from his dialysis access. Patient is on home dialysis. As per the report, he was setting up his machine for dialysis at home then suddenly the needle became dislodged from the AV fistula on the left armand he had prolonged and heavy bleeding from the venous side. Heapplied pressure on the bleeding site and it ultimately stopped but there was report that he lost about half pint of blood. He started feeling dizziness and lightheaded and weak and presented to the emergency department. There was no report of bleeding from any other site. Patient states that his blood pressure usually runs low with systolic of 90BPJ low 121K Patient seen and examined at the bedside this afternoon. During my evaluation, his blood pressure still soft but had improved. He denied any chest pain, shortness of breath, palpitations, abdomen pain, nausea, vomiting, headache, diarrhea or dysuria.  Patient admitted as above with acute symptomatic anemia in setting of blood loss after incident at dialysis.  After transfusion patient's blood pressure and hemoglobin remained stable.  Otherwise patient is asymptomatic and requesting discharge home, discussed with dialysis unit  and nephrology, patient will follow up outpatient with dialysis on his usual day.  Close follow-up with PCP and nephrology as previously scheduled.   Discharge Diagnoses:  Active Problems:   Acute blood loss anemia    Discharge Instructions  Discharge Instructions    Diet general   Complete by: As directed    Renal diet   Increase activity slowly   Complete by: As directed      Allergies as of 02/07/2021      Reactions   Sulfa Antibiotics Shortness Of Breath, Rash   Hives, itching      Medication List    STOP taking these medications   HYDROcodone-acetaminophen 5-325 MG tablet Commonly known as: NORCO/VICODIN   traMADol 50 MG tablet Commonly known as: ULTRAM     TAKE these medications   allopurinol 100 MG tablet Commonly known as: ZYLOPRIM Take 100 mg by mouth daily.   amitriptyline 25 MG tablet Commonly known as: ELAVIL Take 75 mg by mouth at bedtime.   atenolol 50 MG tablet Commonly known as: TENORMIN Take 50 mg by mouth daily as needed (blood pressure).   ferrous sulfate 325 (65 FE) MG tablet Take 325 mg by mouth at bedtime.   Humira Pen 40 MG/0.4ML Pnkt Generic drug: Adalimumab Inject 40 mg/mL as directed See admin instructions. 40 mg/0.29m injection every two weeks (Friday/Saturday)   Pepcid 20 MG tablet Generic drug: famotidine Take 20 mg by mouth every other day.   pravastatin 40 MG tablet Commonly known as: PRAVACHOL Take 40 mg by mouth at bedtime.   sevelamer carbonate 800 MG tablet Commonly known as: RENVELA Take 4 tablets (3,200 mg total) by mouth 3 (three) times daily with meals. What changed:   how much to take  when to take  this  additional instructions       Allergies  Allergen Reactions  . Sulfa Antibiotics Shortness Of Breath and Rash    Hives, itching    Consultations: Nephrology  Procedures/Studies: DG Chest 2 View  Result Date: 02/06/2021 CLINICAL DATA:  Missed dialysis.  Weakness EXAM: CHEST - 2 VIEW  COMPARISON:  03/16/2019 FINDINGS: Stable cardiomegaly. Mild diffuse interstitial prominence. No lobar consolidation. No pleural effusion. No pneumothorax. IMPRESSION: Cardiomegaly with mild diffuse interstitial prominence which may reflect mild edema. Electronically Signed   By: Davina Poke D.O.   On: 02/06/2021 12:30     Subjective: No acute issues or events overnight, feels back to baseline requesting discharge home which is certainly reasonable   Discharge Exam: Vitals:   02/07/21 0152 02/07/21 0300  BP: (!) 88/52 (!) 108/55  Pulse: 84   Resp: (!) 24   Temp: 98.4 F (36.9 C)   SpO2: 100%    Vitals:   02/07/21 0052 02/07/21 0122 02/07/21 0152 02/07/21 0300  BP: (!) 94/59 (!) 93/55 (!) 88/52 (!) 108/55  Pulse: 81  84   Resp: (!) 21 (!) 23 (!) 24   Temp:   98.4 F (36.9 C)   TempSrc:   Oral   SpO2: 99%  100%   Height:        General: Pt is alert, awake, not in acute distress Cardiovascular: RRR, S1/S2 +, no rubs, no gallops Respiratory: CTA bilaterally, no wheezing, no rhonchi Abdominal: Soft, NT, ND, bowel sounds + Extremities: no edema, no cyanosis    The results of significant diagnostics from this hospitalization (including imaging, microbiology, ancillary and laboratory) are listed below for reference.     Microbiology: Recent Results (from the past 240 hour(s))  Resp Panel by RT-PCR (Flu A&B, Covid) Nasopharyngeal Swab     Status: None   Collection Time: 02/06/21  3:59 PM   Specimen: Nasopharyngeal Swab; Nasopharyngeal(NP) swabs in vial transport medium  Result Value Ref Range Status   SARS Coronavirus 2 by RT PCR NEGATIVE NEGATIVE Final    Comment: (NOTE) SARS-CoV-2 target nucleic acids are NOT DETECTED.  The SARS-CoV-2 RNA is generally detectable in upper respiratory specimens during the acute phase of infection. The lowest concentration of SARS-CoV-2 viral copies this assay can detect is 138 copies/mL. A negative result does not preclude  SARS-Cov-2 infection and should not be used as the sole basis for treatment or other patient management decisions. A negative result may occur with  improper specimen collection/handling, submission of specimen other than nasopharyngeal swab, presence of viral mutation(s) within the areas targeted by this assay, and inadequate number of viral copies(<138 copies/mL). A negative result must be combined with clinical observations, patient history, and epidemiological information. The expected result is Negative.  Fact Sheet for Patients:  EntrepreneurPulse.com.au  Fact Sheet for Healthcare Providers:  IncredibleEmployment.be  This test is no t yet approved or cleared by the [place] FDA and  has been authorized for detection and/or diagnosis of SARS-CoV-2 by FDA under an Emergency Use Authorization (EUA). This EUA will remain  in effect (meaning this test can be used) for the duration of the COVID-19 declaration under Section 564(b)(1) of the Act, 21 U.S.C.section 360bbb-3(b)(1), unless the authorization is terminated  or revoked sooner.       Influenza A by PCR NEGATIVE NEGATIVE Final   Influenza B by PCR NEGATIVE NEGATIVE Final    Comment: (NOTE) The Xpert Xpress SARS-CoV-2/FLU/RSV plus assay is intended as an aid in the diagnosis of  influenza from Nasopharyngeal swab specimens and should not be used as a sole basis for treatment. Nasal washings and aspirates are unacceptable for Xpert Xpress SARS-CoV-2/FLU/RSV testing.  Fact Sheet for Patients: EntrepreneurPulse.com.au  Fact Sheet for Healthcare Providers: IncredibleEmployment.be  This test is not yet approved or cleared by the [place] FDA and has been authorized for detection and/or diagnosis of SARS-CoV-2 by FDA under an Emergency Use Authorization (EUA). This EUA will remain in effect (meaning this test can be used) for the duration of  the COVID-19 declaration under Section 564(b)(1) of the Act, 21 U.S.C. section 360bbb-3(b)(1), unless the authorization is terminated or revoked.  Performed at [facility], [place] [address] [place], [place] [address]      Labs: BNP (last 3 results) No results for input(s): BNP in the last 8760 hours. Basic Metabolic Panel: Recent Labs  Lab 02/06/21 1125 02/07/21 0415  NA 134* 134*  K 6.1* 3.6  CL 97* 94*  CO2 21* 26  GLUCOSE 96 134*  BUN 113* 47*  CREATININE 20.22* 11.37*  CALCIUM 8.4* 8.7*   Liver Function Tests: No results for input(s): AST, ALT, ALKPHOS, BILITOT, PROT, ALBUMIN in the last 168 hours. No results for input(s): LIPASE, AMYLASE in the last 168 hours. No results for input(s): AMMONIA in the last 168 hours. CBC: Recent Labs  Lab 02/06/21 1125 02/07/21 0415  WBC 5.8 6.6  HGB 7.1* 8.2*  HCT 21.8* 23.5*  MCV 111.2* 102.6*  PLT 213 214   Cardiac Enzymes: No results for input(s): CKTOTAL, CKMB, CKMBINDEX, TROPONINI in the last 168 hours. BNP: Invalid input(s): POCBNP CBG: No results for input(s): GLUCAP in the last 168 hours. D-Dimer No results for input(s): DDIMER in the last 72 hours. Hgb A1c No results for input(s): HGBA1C in the last 72 hours. Lipid Profile No results for input(s): CHOL, HDL, LDLCALC, TRIG, CHOLHDL, LDLDIRECT in the last 72 hours. Thyroid function studies No results for input(s): TSH, T4TOTAL, T3FREE, THYROIDAB in the last 72 hours.  Invalid input(s): FREET3 Anemia work up Recent Labs    02/07/21 0415  VITAMINB12 1,273*  FOLATE 22.0   Urinalysis    Component Value Date/Time   COLORURINE YELLOW 11/18/2020 [place] 11/18/2020 1945   LABSPEC 1.025 11/18/2020 1945   PHURINE 5.5 11/18/2020 1945   GLUCOSEU NEGATIVE 11/18/2020 1945   HGBUR SMALL (A) 11/18/2020 1945   BILIRUBINUR NEGATIVE 11/18/2020 1945   KETONESUR NEGATIVE 11/18/2020 1945   PROTEINUR 30 (A) 11/18/2020 1945   UROBILINOGEN 0.2  11/14/2010 1927   NITRITE NEGATIVE 11/18/2020 1945   LEUKOCYTESUR TRACE (A) 11/18/2020 1945   Sepsis Labs Invalid input(s): PROCALCITONIN,  WBC,  LACTICIDVEN Microbiology Recent Results (from the past 240 hour(s))  Resp Panel by RT-PCR (Flu A&B, Covid) Nasopharyngeal Swab     Status: None   Collection Time: 02/06/21  3:59 PM   Specimen: Nasopharyngeal Swab; Nasopharyngeal(NP) swabs in vial transport medium  Result Value Ref Range Status   SARS Coronavirus 2 by RT PCR NEGATIVE NEGATIVE Final    Comment: (NOTE) SARS-CoV-2 target nucleic acids are NOT DETECTED.  The SARS-CoV-2 RNA is generally detectable in upper respiratory specimens during the acute phase of infection. The lowest concentration of SARS-CoV-2 viral copies this assay can detect is 138 copies/mL. A negative result does not preclude SARS-Cov-2 infection and should not be used as the sole basis for treatment or other patient management decisions. A negative result may occur with  improper specimen collection/handling, submission of specimen  other than nasopharyngeal swab, presence of viral mutation(s) within the areas targeted by this assay, and inadequate number of viral copies(<138 copies/mL). A negative result must be combined with clinical observations, patient history, and epidemiological information. The expected result is Negative.  Fact Sheet for Patients:  EntrepreneurPulse.com.au  Fact Sheet for Healthcare Providers:  IncredibleEmployment.be  This test is no t yet approved or cleared by the [place] FDA and  has been authorized for detection and/or diagnosis of SARS-CoV-2 by FDA under an Emergency Use Authorization (EUA). This EUA will remain  in effect (meaning this test can be used) for the duration of the COVID-19 declaration under Section 564(b)(1) of the Act, 21 U.S.C.section 360bbb-3(b)(1), unless the authorization is terminated  or revoked sooner.        Influenza A by PCR NEGATIVE NEGATIVE Final   Influenza B by PCR NEGATIVE NEGATIVE Final    Comment: (NOTE) The Xpert Xpress SARS-CoV-2/FLU/RSV plus assay is intended as an aid in the diagnosis of influenza from Nasopharyngeal swab specimens and should not be used as a sole basis for treatment. Nasal washings and aspirates are unacceptable for Xpert Xpress SARS-CoV-2/FLU/RSV testing.  Fact Sheet for Patients: EntrepreneurPulse.com.au  Fact Sheet for Healthcare Providers: IncredibleEmployment.be  This test is not yet approved or cleared by the [place] FDA and has been authorized for detection and/or diagnosis of SARS-CoV-2 by FDA under an Emergency Use Authorization (EUA). This EUA will remain in effect (meaning this test can be used) for the duration of the COVID-19 declaration under Section 564(b)(1) of the Act, 21 U.S.C. section 360bbb-3(b)(1), unless the authorization is terminated or revoked.  Performed at [facility], [place] [address], [place], [place] [address]      Time coordinating discharge: Over 30 minutes  SIGNED:   Little Ishikawa, DO [facility] 02/07/2021, 12:16 PM Pager   If 7PM-7AM, please contact night-coverage [email or web address]

## 2021-02-07 NOTE — Plan of Care (Signed)

## 2021-02-11 ENCOUNTER — Other Ambulatory Visit: Payer: Self-pay

## 2021-02-11 ENCOUNTER — Ambulatory Visit: Payer: Commercial Managed Care - PPO | Admitting: Podiatry

## 2021-02-11 ENCOUNTER — Ambulatory Visit: Payer: Commercial Managed Care - PPO

## 2021-02-11 ENCOUNTER — Ambulatory Visit (INDEPENDENT_AMBULATORY_CARE_PROVIDER_SITE_OTHER): Payer: Commercial Managed Care - PPO

## 2021-02-11 DIAGNOSIS — S99922A Unspecified injury of left foot, initial encounter: Secondary | ICD-10-CM

## 2021-02-11 DIAGNOSIS — S92504A Nondisplaced unspecified fracture of right lesser toe(s), initial encounter for closed fracture: Secondary | ICD-10-CM

## 2021-02-11 DIAGNOSIS — S99921A Unspecified injury of right foot, initial encounter: Secondary | ICD-10-CM

## 2021-02-13 ENCOUNTER — Other Ambulatory Visit: Payer: Self-pay | Admitting: Podiatry

## 2021-02-13 DIAGNOSIS — S99921A Unspecified injury of right foot, initial encounter: Secondary | ICD-10-CM

## 2021-02-24 NOTE — Progress Notes (Signed)
   HPI: 55 y.o. male presenting today for follow-up evaluation of a fracture to the fifth digit of the left foot DOI: 12/18/2020.  At urgent care he was diagnosed with a fracture and referred here for further treatment and evaluation.  He continues to wear the postsurgical shoe.  Patient also has a new complaint regarding an injury that he sustained a few weeks ago to the right foot.  He states that since he injured his foot the pain has improved significantly.  DOI: 02/08/2021. Past Medical History:  Diagnosis Date  . Anemia    iron deficiency  . Arthritis    Gout  . CKD (chronic kidney disease) stage 3, GFR 30-59 ml/min (HCC)    Stage 4  . Gout   . History of chicken pox   . Hypertension   . Melanosis coli   . Mumps   . Peripheral neuropathy   . Pneumonia   . Ulcerative colitis [place], [place]     Physical Exam: General: The patient is alert and oriented x3 in no acute distress.  Dermatology: Skin is warm, dry and supple bilateral lower extremities. Negative for open lesions or macerations.  Vascular: Palpable pedal pulses bilaterally. No edema or erythema noted. Capillary refill within normal limits.  Neurological: Epicritic and protective threshold grossly intact bilaterally.   Musculoskeletal Exam: Range of motion within normal limits to all pedal and ankle joints bilateral. Muscle strength 5/5 in all groups bilateral.  There is some tenderness to palpation on the right forefoot.  Overall there is been significant improvement.  There also appears to be some tenderness to palpation to the left fifth toe.  Radiographic Exam:  Normal osseous mineralization. Joint spaces preserved.  Closed, minimally displaced fracture of the phalanx of the left fifth toe noted with routine healing  Assessment: 1.  Fracture left fifth toe closed, nondisplaced, with routine healing 2.  Right foot sprain-improving   Plan of Care:  1. Patient evaluated. X-Rays reviewed.  2.  Continue  minimal weightbearing and conservative treatments including rest ice elevation and compression to the right foot. 3.  Patient may then transfer into custom molded orthotics that he received from the [place] 4.  Recommend good orthotics and good stability shoes for the right foot 5.  Return to clinic as needed     Edrick Kins, DPM [facility]  Dr. Edrick Kins, DPM    [address], [place] [address]                Office [phone]  Fax [phone]

## 2021-03-05 ENCOUNTER — Ambulatory Visit (HOSPITAL_COMMUNITY)
Admission: RE | Admit: 2021-03-05 | Discharge: 2021-03-05 | Disposition: A | Discharge status: Home / Self Care | Payer: Medicare Other | Source: Ambulatory Visit | Attending: Cardiology | Admitting: Cardiology

## 2021-03-05 ENCOUNTER — Other Ambulatory Visit (HOSPITAL_COMMUNITY): Payer: Self-pay | Admitting: Nephrology

## 2021-03-05 ENCOUNTER — Other Ambulatory Visit: Payer: Self-pay

## 2021-03-05 DIAGNOSIS — I6523 Occlusion and stenosis of bilateral carotid arteries: Secondary | ICD-10-CM

## 2021-04-23 ENCOUNTER — Emergency Department (HOSPITAL_COMMUNITY)
Admission: EM | Admit: 2021-04-23 | Discharge: 2021-04-24 | Disposition: A | Discharge status: Home / Self Care | Payer: Medicare Other | Attending: Emergency Medicine | Admitting: Emergency Medicine

## 2021-04-23 ENCOUNTER — Encounter (HOSPITAL_COMMUNITY): Payer: Self-pay | Admitting: Emergency Medicine

## 2021-04-23 DIAGNOSIS — Z87891 Personal history of nicotine dependence: Secondary | ICD-10-CM | POA: Diagnosis not present

## 2021-04-23 DIAGNOSIS — I120 Hypertensive chronic kidney disease with stage 5 chronic kidney disease or end stage renal disease: Secondary | ICD-10-CM | POA: Insufficient documentation

## 2021-04-23 DIAGNOSIS — Z20822 Contact with and (suspected) exposure to covid-19: Secondary | ICD-10-CM | POA: Insufficient documentation

## 2021-04-23 DIAGNOSIS — Z79899 Other long term (current) drug therapy: Secondary | ICD-10-CM | POA: Diagnosis not present

## 2021-04-23 DIAGNOSIS — Z992 Dependence on renal dialysis: Secondary | ICD-10-CM | POA: Diagnosis not present

## 2021-04-23 DIAGNOSIS — Z8616 Personal history of COVID-19: Secondary | ICD-10-CM | POA: Insufficient documentation

## 2021-04-23 DIAGNOSIS — N186 End stage renal disease: Secondary | ICD-10-CM | POA: Insufficient documentation

## 2021-04-23 DIAGNOSIS — R0602 Shortness of breath: Secondary | ICD-10-CM | POA: Diagnosis present

## 2021-04-23 LAB — CBC WITH DIFFERENTIAL/PLATELET
Abs Immature Granulocytes: 0.02 10*3/uL (ref 0.00–0.07)
Basophils Absolute: 0 10*3/uL (ref 0.0–0.1)
Basophils Relative: 1 %
Eosinophils Absolute: 0.2 10*3/uL (ref 0.0–0.5)
Eosinophils Relative: 4 %
HCT: 29.4 % — ABNORMAL LOW (ref 39.0–52.0)
Hemoglobin: 9.8 g/dL — ABNORMAL LOW (ref 13.0–17.0)
Immature Granulocytes: 0 %
Lymphocytes Relative: 35 %
Lymphs Abs: 1.9 10*3/uL (ref 0.7–4.0)
MCH: 35.5 pg — ABNORMAL HIGH (ref 26.0–34.0)
MCHC: 33.3 g/dL (ref 30.0–36.0)
MCV: 106.5 fL — ABNORMAL HIGH (ref 80.0–100.0)
Monocytes Absolute: 0.9 10*3/uL (ref 0.1–1.0)
Monocytes Relative: 16 %
Neutro Abs: 2.4 10*3/uL (ref 1.7–7.7)
Neutrophils Relative %: 44 %
Platelets: 220 10*3/uL (ref 150–400)
RBC: 2.76 MIL/uL — ABNORMAL LOW (ref 4.22–5.81)
RDW: 14.6 % (ref 11.5–15.5)
WBC: 5.4 10*3/uL (ref 4.0–10.5)
nRBC: 0 % (ref 0.0–0.2)

## 2021-04-23 LAB — BASIC METABOLIC PANEL
Anion gap: 15 (ref 5–15)
BUN: 89 mg/dL — ABNORMAL HIGH (ref 6–20)
CO2: 21 mmol/L — ABNORMAL LOW (ref 22–32)
Calcium: 8.9 mg/dL (ref 8.9–10.3)
Chloride: 101 mmol/L (ref 98–111)
Creatinine, Ser: 19.39 mg/dL — ABNORMAL HIGH (ref 0.61–1.24)
GFR, Estimated: 3 mL/min — ABNORMAL LOW (ref >60)
Glucose, Bld: 102 mg/dL — ABNORMAL HIGH (ref 70–99)
Potassium: 4.2 mmol/L (ref 3.5–5.1)
Sodium: 137 mmol/L (ref 135–145)

## 2021-04-23 NOTE — ED Provider Notes (Signed)
Emergency Medicine Provider Triage Evaluation Note  Sanders Manninen , a 55 y.o. male  was evaluated in triage.  Pt complains of unable to complete his home dialysis since Monday.  He missed Tuesday and try to make up for session today but his machine messed up 2 days ago.  Today sent him a new machine but he was unable to program it.  Reports fluid retention and shortness of breath.  Denies any chest pain, fever.  Review of Systems  Positive: Shortness of breath Negative: Chest pain, fever  Physical Exam  BP 129/85   Pulse 80   Temp 98.6 F (37 C) (Oral)   Resp 18   SpO2 100%  Gen:   Awake, no distress   Resp:  Normal effort  MSK:   Moves extremities without difficulty  Other:  No signs of respiratory distress  Medical Decision Making  Medically screening exam initiated at 9:22 PM.  Appropriate orders placed.  Jeffry Vogelsang was informed that the remainder of the evaluation will be completed by another provider, this initial triage assessment does not replace that evaluation, and the importance of remaining in the ED until their evaluation is complete.  Lab work and EKG ordered   Delia Heady, Hershal Coria 04/23/21 2123    Arnaldo Natal, MD 04/23/21 [phone]

## 2021-04-23 NOTE — ED Triage Notes (Signed)
HD Monday, Tuesday, [place] and Friday unable to get HD since Monday c/o increase SOB.

## 2021-04-24 ENCOUNTER — Emergency Department (HOSPITAL_COMMUNITY): Payer: Medicare Other

## 2021-04-24 ENCOUNTER — Other Ambulatory Visit: Payer: Self-pay

## 2021-04-24 DIAGNOSIS — N186 End stage renal disease: Secondary | ICD-10-CM | POA: Diagnosis not present

## 2021-04-24 LAB — RESP PANEL BY RT-PCR (FLU A&B, COVID) ARPGX2
Influenza A by PCR: NEGATIVE
Influenza B by PCR: NEGATIVE
SARS Coronavirus 2 by RT PCR: NEGATIVE

## 2021-04-24 MED ORDER — CHLORHEXIDINE GLUCONATE CLOTH 2 % EX PADS: 6.0000 | MEDICATED_PAD | Freq: Every day | CUTANEOUS | Status: DC

## 2021-04-24 NOTE — Progress Notes (Signed)
Call from ER  ,   patient  home dialysis  next stage   last HD 7/4 with machine malfunction due to technical issues -  machine replaced 7/7 but patient was still unable to operate . Patient told by office to have dialysis in hospital until the technical issues can be resolved.  Dialysis   BFR 450  DFR 17   K 2   EDW 107.5   4 x weekly  3/5 hrs  Usual UF  variable -0.5  to - 2.2   will set for 2-3 L  BP 120/80   P 65   sats 100 % RA  Na 137  K 4.2  Cl 101  CO2 21  BUN 89  Cr 19.39  Ca 8.9   Hb 9.8

## 2021-04-24 NOTE — ED Provider Notes (Signed)
[facility] EMERGENCY DEPARTMENT Provider Note   CSN: [id number] Arrival date & time: 04/23/21  2036     History Chief Complaint  Patient presents with   Shortness of Breath    Colin [name] is a 55 y.o. male.  55 y.o male, on Nxstage home hemodialysis, presents to the emergency department for dialysis.  Was last dialyzed on Monday, but noticed that his home machine malfunctioned and he required a replacement.  This was sent yesterday, but he was unable to program it.  He is supposed to have someone coming out to his house to help him with this tomorrow.  Was advised by his nephrology clinic to come for dialysis to prevent fluid retention.  He presently has no complaints of shortness of breath.  Denies chest pain, fever, abdominal pain.  The history is provided by the patient. No language interpreter was used.  Shortness of Breath     Past Medical History:  Diagnosis Date   Anemia    iron deficiency   Arthritis    Gout   CKD (chronic kidney disease) stage 3, GFR 30-59 ml/min (HCC)    Stage 4   Gout    History of chicken pox    Hypertension    Melanosis coli    Mumps    Peripheral neuropathy    Pneumonia    Ulcerative colitis [place], [place]    Patient Active Problem List   Diagnosis Date Noted   Acute blood loss anemia 02/06/2021   Pneumonia due to COVID-19 virus 03/10/2019   Macrocytic anemia 03/10/2019   Acute hypoxemic respiratory failure ([place])    Hyperprolactinemia ([place]) 01/17/2018   Screening for malignant neoplasm of prostate 01/17/2018   Gynecomastia 01/17/2018   Ingrown toenail 06/21/2017   Onychomycosis 06/21/2017   ESRD (end stage renal disease) ([place]) 02/07/2017   Morbid obesity ([place]) 02/07/2017   Bilateral leg edema 10/02/2015   Hyperlipidemia 10/02/2015   Edema 02/09/2011   Chronic renal insufficiency 02/09/2011   ANEMIA, IRON DEFICIENCY 10/16/2007   Essential hypertension 10/16/2007   Ulcerative colitis ([place])  02/13/2007   MELANOSIS COLI 02/13/2007    Past Surgical History:  Procedure Laterality Date   AV FISTULA PLACEMENT Right 08/27/2016   Procedure: RIGHT RADIOCEPHALIC ARTERIOVENOUS FISTULA CREATION;  Surgeon: [name], MD;  Location: [place];  Service: Vascular;  Laterality: Right;   AV FISTULA PLACEMENT Right 10/21/2017   Procedure: ARTERIOVENOUS (AV) FISTULA CREATION RIGHT ARM;  Surgeon: [name], MD;  Location: [place];  Service: Vascular;  Laterality: Right;   COLONOSCOPY     FISTULA SUPERFICIALIZATION Right 11/12/2016   Procedure: FISTULA SUPERFICIALIZATION RIGHT FOREEARM;  Surgeon: [name], MD;  Location: [facility] OR;  Service: Vascular;  Laterality: Right;   REVISON OF ARTERIOVENOUS FISTULA Right 12/23/2017   Procedure: CEPHALIC VEIN TRANSPOSITION ARTERIOVENOUS FISTULA RIGHT UPPER ARM;  Surgeon: [name], MD;  [facility];  Service: Vascular;  Laterality: Right;   TENDON REPAIR  1987   Left index finger       Family History  Problem Relation Age of Onset   Hypertension Father    Heart disease Father    Renal Disease Father    Hypertension Mother     Social History   Tobacco Use   Smoking status: Never   Smokeless tobacco: Former    Types: Nurse, children's Use: Never used  Substance Use Topics   Alcohol use: Yes  Comment: rare   Drug use: No    Home Medications Prior to Admission medications   Medication Sig Start Date End Date Taking? Authorizing Provider  allopurinol (ZYLOPRIM) 100 MG tablet Take 100 mg by mouth daily.    [provider]  amitriptyline (ELAVIL) 25 MG tablet Take 75 mg by mouth at bedtime.    [provider]  atenolol (TENORMIN) 50 MG tablet Take 50 mg by mouth daily as needed (blood pressure).    [provider]  ferrous sulfate 325 (65 FE) MG tablet Take 325 mg by mouth at bedtime.    [provider]  HUMIRA PEN 40 MG/0.4ML PNKT Inject 40 mg/mL as directed See admin instructions. 40  mg/0.86m injection every two weeks (Friday/Saturday) 02/02/21   [provider]  PEPCID 20 MG tablet Take 20 mg by mouth every other day.  06/06/20   [provider]  pravastatin (PRAVACHOL) 40 MG tablet Take 40 mg by mouth at bedtime.     [provider]    Allergies    Sulfa antibiotics  Review of Systems   Review of Systems  Respiratory:  Positive for shortness of breath.   Ten systems reviewed and are negative for acute change, except as noted in the HPI.    Physical Exam Updated Vital Signs BP 111/82   Pulse 66   Temp 98 F (36.7 C) (Oral)   Resp (!) 21   Ht 5' 5"  (1.651 m)   Wt 104.3 kg   SpO2 100%   BMI 38.27 kg/m   Physical Exam Vitals and nursing note reviewed.  Constitutional:      General: He is not in acute distress.    Appearance: He is well-developed. He is not diaphoretic.     Comments: Nontoxic appearing and in NAD  HENT:     Head: Normocephalic and atraumatic.  Eyes:     General: No scleral icterus.    Conjunctiva/sclera: Conjunctivae normal.  Cardiovascular:     Rate and Rhythm: Normal rate and regular rhythm.     Pulses: Normal pulses.  Pulmonary:     Effort: Pulmonary effort is normal. No respiratory distress.     Breath sounds: No stridor. No wheezing.     Comments: Respirations even and unlabored Abdominal:     Comments: Obese, nontender abdomen  Musculoskeletal:        General: Normal range of motion.     Cervical back: Normal range of motion.  Skin:    General: Skin is warm and dry.     Coloration: Skin is not pale.     Findings: No erythema or rash.  Neurological:     Mental Status: He is alert and oriented to person, place, and time.  Psychiatric:        Behavior: Behavior normal.    ED Results / Procedures / Treatments   Labs (all labs ordered are listed, but only abnormal results are displayed) Labs Reviewed  BASIC METABOLIC PANEL - Abnormal; Notable for the following components:      Result Value    CO2 21 (*)    Glucose, Bld 102 (*)    BUN 89 (*)    Creatinine, Ser 19.39 (*)    GFR, Estimated 3 (*)    All other components within normal limits  CBC WITH DIFFERENTIAL/PLATELET - Abnormal; Notable for the following components:   RBC 2.76 (*)    Hemoglobin 9.8 (*)    HCT 29.4 (*)    MCV 106.5 (*)  MCH 35.5 (*)    All other components within normal limits  RESP PANEL BY RT-PCR (FLU A&B, COVID) ARPGX2    EKG EKG Interpretation  Date/Time:  Thursday April 23 2021 21:26:02 EDT Ventricular Rate:  77 PR Interval:  168 QRS Duration: 80 QT Interval:  412 QTC Calculation: 466 R Axis:   78 Text Interpretation: Normal sinus rhythm Normal ECG No significant change since last tracing Confirmed by Wandra Arthurs [phone]) on 04/24/2021 3:47:20 AM  Radiology DG Chest 2 View  Result Date: 04/24/2021 CLINICAL DATA:  Shortness of breath.  Missed dialysis EXAM: CHEST - 2 VIEW COMPARISON:  02/06/2021 FINDINGS: Artifact from EKG leads. Cardiomegaly. There is no edema, consolidation, effusion, or pneumothorax. No acute osseous finding. IMPRESSION: No pulmonary edema or pleural fluid. Electronically Signed   By: Monte Fantasia M.D.   On: 04/24/2021 04:35    Procedures Procedures   Medications Ordered in ED Medications - No data to display  ED Course  I have reviewed the triage vital signs and the nursing notes.  Pertinent labs & imaging results that were available during my care of the patient were reviewed by me and considered in my medical decision making (see chart for details).  Clinical Course as of 04/24/21 0541  Fri Apr 24, 2021  0540 Spoke with Dr. Justin Mend of nephrology who will place orders for inpatient dialysis with plan for discharge after completion. [KH]    Clinical Course User Index [KH] Antonietta Breach, PA-C   MDM Rules/Calculators/A&P                          55 year old male on home dialysis 4 days a week presenting to the hospital to be dialyzed after his home unit  malfunctioned.  He was last dialyzed on Monday.  He has no complaints and his laboratory evaluation, chest x-ray are reassuring.  No significant electrolyte derangements.  Case has been discussed with Dr. Justin Mend of nephrology who will coordinate inpatient dialysis for the patient with anticipated discharge home once completed.  Patient aware and agreeable with plan.   Final Clinical Impression(s) / ED Diagnoses Final diagnoses:  ESRD needing dialysis [facility])    Rx / DC Orders ED Discharge Orders     None        Antonietta Breach, PA-C 04/24/21 0542    Drenda Freeze, MD 04/24/21 [id number]

## 2021-04-24 NOTE — ED Notes (Signed)
Not in room at this time 

## 2021-04-24 NOTE — Discharge Instructions (Addendum)
Follow-up with your kidney doctor.  Come back to ER as needed.

## 2021-04-24 NOTE — ED Notes (Signed)
Patient transported to X-ray 

## 2021-04-24 NOTE — ED Notes (Signed)
Patient transported to dialysis

## 2021-04-24 NOTE — ED Notes (Addendum)
ED Provider at bedside. 

## 2021-04-24 NOTE — ED Notes (Signed)
Brought to ED from dialysis, A/A/O, denies pain discomfort, no distress.

## 2021-06-08 ENCOUNTER — Ambulatory Visit: Payer: Commercial Managed Care - PPO | Admitting: Cardiovascular Disease

## 2021-07-18 NOTE — Progress Notes (Signed)
Date:  07/20/2021   ID:  Colin [name], DOB 08/17/1966, MRN [id number]  Patient Location:  [address] [place] [address]   Provider location:   [facility], [place] office  PCP:  Gaynelle Arabian, MD  Cardiologist:  Ida Rogue, MD   Chief Complaint  Patient presents with   Shortness of Breath    Pt asking for stress test , transplant list for kidney     History of Present Illness:    Colin [name] is a 55 y.o. male past medical history of hypertension,  stage IV kidney disease,attributed to systemic hypertension , diagnosis of focal segmental glomerulosclerosis AV fistula, for HD ulcerative colitis dating back to 2000 on Humira  Chronic leg edema Echo normal in 01/2014 awaiting kidney transplant at [facility] Obstructive sleep apnea on CPAP Who presents for leg edema, HTN, prekidney transplant eval  LOV 11/2020 Followed at the [place] transplant list for kidney, listed since April 2020, seen on annual basis  Echocardiogram at outside facility April 23, 2021  ejection fraction greater than 55% NORMAL LEFT VENTRICULAR SYSTOLIC FUNCTION WITH MILD LVH  NORMAL RIGHT VENTRICULAR SYSTOLIC FUNCTION  VALVULAR REGURGITATION: TRIVIAL MR, MILD PR, MILD TR  NO VALVULAR STENOSIS  ESTIMATED RVSP OF AT LEAST 43 mmHg  DILATED MPA (3.3 cm) WITH MILD PR   lexiscan myoview 06/2020 that showed normal wall motion, EF 53%, low risk, small region of ischemia in the distal anterior wall, apical attenuation correction.   Echo in August 2021 at outside office showed  EF 55-60%, G1DD, mod concentric LVH, no significant valvular disease  testosterone was low , now being treated Covid 02/2019  home dialysis, started Nov 2021, HD 4 days a week  No regular exercise program, sleeping in the daytime Sleeping issues as detailed below  Sleep study at [place] On CPAP, started at [place], started Jan 2022 Comes off face  Recent lab work reviewed July 2022 Hemoglobin  9.8, creatinine 19, BUN 89 Total cholesterol in the past 178  EKG personally reviewed by myself on todays visit Normal sinus rhythm  Past medical history reviewed CT scan January 2020 showing mild atherosclerosis in the proximal left common iliac vessel Otherwise relatively clean distal aorta and iliac, femoral artery vessels   Prior CV studies:   The following studies were reviewed today:  Echocardiogram April 2015 ejection fraction 55 to 60%  Past Medical History:  Diagnosis Date   Anemia    iron deficiency   Arthritis    Gout   CKD (chronic kidney disease) stage 3, GFR 30-59 ml/min (HCC)    Stage 4   Gout    History of chicken pox    Hypertension    Melanosis coli    Mumps    Peripheral neuropathy    Pneumonia    Ulcerative colitis [place], [place]   Past Surgical History:  Procedure Laterality Date   AV FISTULA PLACEMENT Right 08/27/2016   Procedure: RIGHT RADIOCEPHALIC ARTERIOVENOUS FISTULA CREATION;  Surgeon: [name], MD;  Location: [place];  Service: Vascular;  Laterality: Right;   AV FISTULA PLACEMENT Right 10/21/2017   Procedure: ARTERIOVENOUS (AV) FISTULA CREATION RIGHT ARM;  Surgeon: [name], MD;  Location: [place];  Service: Vascular;  Laterality: Right;   COLONOSCOPY     FISTULA SUPERFICIALIZATION Right 11/12/2016   Procedure: FISTULA SUPERFICIALIZATION RIGHT FOREEARM;  Surgeon: [name], MD;  Location: [place] [place];  Service: Vascular;  Laterality:  Right;   REVISON OF ARTERIOVENOUS FISTULA Right 12/23/2017   Procedure: CEPHALIC VEIN TRANSPOSITION ARTERIOVENOUS FISTULA RIGHT UPPER ARM;  Surgeon: [name], MD;  [facility];  Service: Vascular;  Laterality: Right;   TENDON REPAIR  1987   Left index finger     Current Meds  Medication Sig   allopurinol (ZYLOPRIM) 100 MG tablet Take 100 mg by mouth daily.   amitriptyline (ELAVIL) 25 MG tablet Take 75 mg by mouth at bedtime.   calcitRIOL (ROCALTROL) 0.25 MCG capsule Take 0.25 mcg by mouth  daily.   cinacalcet (SENSIPAR) 30 MG tablet Take 30 mg by mouth at bedtime.   ferrous sulfate 325 (65 FE) MG tablet Take 325 mg by mouth at bedtime.   HUMIRA PEN 40 MG/0.4ML PNKT Inject 40 mg/mL as directed See admin instructions. 40 mg/0.36m injection every two weeks (Friday/Saturday)   LOKELMA 10 g PACK packet Take 1 packet by mouth at bedtime.   Methoxy PEG-Epoetin Beta (MIRCERA IJ) Inject into the skin. Dialysis Med   multivitamin (RENA-VIT) TABS tablet Take 1 tablet by mouth daily.   PEPCID 20 MG tablet Take 20 mg by mouth every other day.    traZODone (DESYREL) 100 MG tablet Take 100 mg by mouth at bedtime as needed for sleep.    Current Outpatient Medications on File Prior to Visit  Medication Sig Dispense Refill   allopurinol (ZYLOPRIM) 100 MG tablet Take 100 mg by mouth daily.     amitriptyline (ELAVIL) 25 MG tablet Take 75 mg by mouth at bedtime.     calcitRIOL (ROCALTROL) 0.25 MCG capsule Take 0.25 mcg by mouth daily.     cinacalcet (SENSIPAR) 30 MG tablet Take 30 mg by mouth at bedtime.     ferrous sulfate 325 (65 FE) MG tablet Take 325 mg by mouth at bedtime.     HUMIRA PEN 40 MG/0.4ML PNKT Inject 40 mg/mL as directed See admin instructions. 40 mg/0.411minjection every two weeks (Friday/Saturday)     LOKELMA 10 g PACK packet Take 1 packet by mouth at bedtime.     Methoxy PEG-Epoetin Beta (MIRCERA IJ) Inject into the skin. Dialysis Med     multivitamin (RENA-VIT) TABS tablet Take 1 tablet by mouth daily.     PEPCID 20 MG tablet Take 20 mg by mouth every other day.      traZODone (DESYREL) 100 MG tablet Take 100 mg by mouth at bedtime as needed for sleep.     sevelamer carbonate (RENVELA) 800 MG tablet Take 3,200 mg by mouth See admin instructions. With meals and snacks (Patient not taking: Reported on 07/20/2021)     No current facility-administered medications on file prior to visit.     Allergies:   Sulfa antibiotics   Social History   Tobacco Use   Smoking status:  Never   Smokeless tobacco: Former    Types: ChNurse, children'sse: Never used  Substance Use Topics   Alcohol use: Yes    Comment: rare   Drug use: No     Family Hx: The patient's family history includes Heart disease in his father; Hypertension in his father and mother; Renal Disease in his father.  ROS:   Please see the history of present illness.    Review of Systems  Constitutional: Negative.   Respiratory: Negative.    Cardiovascular: Negative.   Gastrointestinal: Negative.   Musculoskeletal: Negative.   Neurological: Negative.   Psychiatric/Behavioral: Negative.    All other  systems reviewed and are negative.   Labs/Other Tests and Data Reviewed:    Recent Labs: 09/22/2020: TSH 2.376 11/18/2020: ALT 18 04/23/2021: BUN 89; Creatinine, Ser 19.39; Hemoglobin 9.8; Platelets 220; Potassium 4.2; Sodium 137   Recent Lipid Panel No results found for: CHOL, TRIG, HDL, CHOLHDL, LDLCALC, LDLDIRECT  Wt Readings from Last 3 Encounters:  07/20/21 244 lb 6.4 oz (110.9 kg)  04/24/21 230 lb (104.3 kg)  12/08/20 238 lb (108 kg)     Exam:    BP 134/78 (BP Location: Left Arm, Patient Position: Sitting, Cuff Size: Normal)   Pulse 89   Resp 20   Ht 5' 4"  (1.626 m)   Wt 244 lb 6.4 oz (110.9 kg)   SpO2 96%   BMI 41.95 kg/m  Constitutional:  oriented to person, place, and time. No distress.  HENT:  Head: Grossly normal Eyes:  no discharge. No scleral icterus.  Neck: No JVD, no carotid bruits  Cardiovascular: Regular rate and rhythm, no murmurs appreciated Pulmonary/Chest: Clear to auscultation bilaterally, no wheezes or rails Abdominal: Soft.  no distension.  no tenderness.  Musculoskeletal: Normal range of motion Neurological:  normal muscle tone. Coordination normal. No atrophy Skin: Skin warm and dry Psychiatric: normal affect, pleasant   ASSESSMENT & PLAN:    1. Morbid obesity ([place]) We have encouraged continued exercise, careful diet management in an effort to  lose weight.  2. ESRD (end stage renal disease) ([place]) Followed by nephrology, on transplant list at [facility] Echo done at the [place] moderate in 2021, at [facility] in 2022, images unavailable for review  By report normal ejection fraction, LVH  no significant valve disease He is requesting repeat Lexiscan Myoview for transplant listing at [facility], this will be ordered Recommend he talk with his team at [facility], consider cardiac CTA which would not need to be repeated on an annual basis  3. Essential hypertension Prior history orthostasis with his dialysis Volume status managed by nephrology  4. Localized edema Stable  5.  Chronic fatigue Concern for poor sleep hygiene, CPAP started January 2022,  Has a sleep doctor at the [place]  6.  Sinus tachycardia Etiology unclear, likely related to volume/hypovolemia, anemia Normal LV function on echo August 2021 Zio monitor previously ordered, was not completed February 2022  7.  Chronic anemia Will defer management to nephrology, may need Procrit    Total encounter time more than 25 minutes  Greater than 50% was spent in counseling and coordination of care with the patient   Signed, Ida Rogue, MD  07/20/2021 8:17 AM    [place] [id number], [place], [place] [address]

## 2021-07-20 ENCOUNTER — Other Ambulatory Visit: Payer: Self-pay

## 2021-07-20 ENCOUNTER — Ambulatory Visit (INDEPENDENT_AMBULATORY_CARE_PROVIDER_SITE_OTHER): Payer: Medicare Other | Admitting: Cardiovascular Disease

## 2021-07-20 ENCOUNTER — Encounter: Payer: Self-pay | Admitting: Cardiovascular Disease

## 2021-07-20 VITALS — BP 134/78 | HR 89 | Resp 20 | Ht 64.0 in | Wt 244.4 lb

## 2021-07-20 DIAGNOSIS — E782 Mixed hyperlipidemia: Secondary | ICD-10-CM

## 2021-07-20 DIAGNOSIS — I6523 Occlusion and stenosis of bilateral carotid arteries: Secondary | ICD-10-CM | POA: Diagnosis not present

## 2021-07-20 DIAGNOSIS — R600 Localized edema: Secondary | ICD-10-CM

## 2021-07-20 DIAGNOSIS — N186 End stage renal disease: Secondary | ICD-10-CM

## 2021-07-20 DIAGNOSIS — I479 Paroxysmal tachycardia, unspecified: Secondary | ICD-10-CM

## 2021-07-20 DIAGNOSIS — R071 Chest pain on breathing: Secondary | ICD-10-CM

## 2021-07-20 DIAGNOSIS — I10 Essential (primary) hypertension: Secondary | ICD-10-CM

## 2021-07-20 DIAGNOSIS — I739 Peripheral vascular disease, unspecified: Secondary | ICD-10-CM | POA: Diagnosis not present

## 2021-07-20 DIAGNOSIS — R0602 Shortness of breath: Secondary | ICD-10-CM

## 2021-07-20 DIAGNOSIS — Z7682 Awaiting organ transplant status: Secondary | ICD-10-CM

## 2021-07-20 NOTE — Patient Instructions (Addendum)
Medication Instructions:  No changes  If you need a refill on your cardiac medications before your next appointment, please call your pharmacy.   Lab work: No new labs needed  Testing/Procedures: Tax inspector (stress test)   Follow-Up: At [facility], you and your health needs are our priority.  As part of our continuing mission to provide you with exceptional heart care, we have created designated Provider Care Teams.  These Care Teams include your primary Cardiologist (physician) and Advanced Practice Providers (APPs -  Physician Assistants and Nurse Practitioners) who all work together to provide you with the care you need, when you need it.  You will need a follow up appointment in 12 months  Providers on your designated Care Team:   Murray Hodgkins, NP Christell Faith, PA-C Marrianne Mood, PA-C [place], [place]  COVID-19 Vaccine Information can be found at: ShippingScam.co.uk For questions related to vaccine distribution or appointments, please email vaccine@[email or web address] .[email or web address] or call [phone].   [facility] MYOVIEW Veterinary surgeon)  Your caregiver has ordered a Stress Test with nuclear imaging. The purpose of this test is to evaluate the blood supply to your heart muscle. This procedure is referred to as a "Non-Invasive Stress Test." This is because other than having an IV started in your vein, nothing is inserted or "invades" your body. Cardiac stress tests are done to find areas of poor blood flow to the heart by determining the extent of coronary artery disease (CAD). Some patients exercise on a treadmill, which naturally increases the blood flow to your heart, while others who are  unable to walk on a treadmill due to physical limitations have a pharmacologic/chemical stress agent called Lexiscan . This medicine will mimic walking on a treadmill by temporarily increasing your coronary blood flow.   Please note: these test may take  anywhere between 2-4 hours to complete  PLEASE REPORT TO [place] TO GO  Instructions regarding medication:   _X__ : Hold diabetes medication morning of procedure Half dose insulin at night No insulin in the morning  ____:  Hold betablocker(s) night before procedure and morning of procedure None  ____:  Hold other medications as follows: No fluid pill morning of test  How to prepare for your Myoview test:  Do not eat or drink after midnight No caffeine for 24 hours prior to test No smoking 24 hours prior to test. ALL your medication may be taken with a few sips of water.   (Except for the meds mention above) Please wear a short sleeve shirt. Comfortable pants are appropriate. No perfume, cologne or lotion.  PLEASE NOTIFY THE OFFICE AT LEAST 6 HOURS IN ADVANCE IF YOU ARE UNABLE TO KEEP YOUR APPOINTMENT.  [phone] AND  PLEASE NOTIFY NUCLEAR MEDICINE AT [facility] AT LEAST 24 HOURS IN ADVANCE IF YOU ARE UNABLE TO KEEP YOUR APPOINTMENT. [phone]

## 2021-07-22 NOTE — Addendum Note (Signed)
Addended by: Janan Ridge on: 07/22/2021 11:22 AM   Modules accepted: Orders

## 2021-07-30 ENCOUNTER — Encounter
Admission: RE | Admit: 2021-07-30 | Discharge: 2021-07-30 | Disposition: A | Discharge status: Home / Self Care | Payer: Medicare Other | Source: Ambulatory Visit | Attending: Cardiovascular Disease | Admitting: Cardiovascular Disease

## 2021-07-30 DIAGNOSIS — R071 Chest pain on breathing: Secondary | ICD-10-CM | POA: Insufficient documentation

## 2021-07-30 DIAGNOSIS — Z7682 Awaiting organ transplant status: Secondary | ICD-10-CM | POA: Insufficient documentation

## 2021-07-30 DIAGNOSIS — R0602 Shortness of breath: Secondary | ICD-10-CM | POA: Insufficient documentation

## 2021-07-30 MED ORDER — TECHNETIUM TC 99M TETROFOSMIN IV KIT
30.0000 | PACK | Freq: Once | INTRAVENOUS | Status: AC | PRN
  Administered 2021-07-30: 30.3 via INTRAVENOUS

## 2021-07-30 MED ORDER — REGADENOSON 0.4 MG/5ML IV SOLN
0.4000 mg | Freq: Once | INTRAVENOUS | Status: AC
  Administered 2021-07-30: 0.4 mg via INTRAVENOUS

## 2021-07-30 MED ORDER — TECHNETIUM TC 99M TETROFOSMIN IV KIT
10.0000 | PACK | Freq: Once | INTRAVENOUS | Status: AC | PRN
  Administered 2021-07-30: 10.8 via INTRAVENOUS

## 2021-07-31 LAB — NM MYOCAR MULTI W/SPECT W/WALL MOTION / EF
LV dias vol: 161 mL (ref 62–150)
LV sys vol: 80 mL
MPHR: 164 {beats}/min
Nuc Stress EF: 50 %
Peak HR: 98 {beats}/min
Percent HR: 59 %
Rest HR: 76 {beats}/min
Rest Nuclear Isotope Dose: 10.8 mCi
SDS: 3
SRS: 4
SSS: 4
ST Depression (mm): 0 mm
Stress Nuclear Isotope Dose: 30.3 mCi
TID: 0.96

## 2021-08-03 ENCOUNTER — Telehealth: Payer: Self-pay

## 2021-08-03 NOTE — Telephone Encounter (Signed)
Able to reach pt regarding his recent Lexiscan Dr. Rockey Situ had a chance to review his results and advised   "Stress test  No significant ischemia, normal ejection fraction,  No significant coronary calcification or aortic atherosclerosis  Low risk study "  Mr. Knee very thankful for the phone call of his results, all questions and concerns were address with nothing further at this time. Will see at next schedule f/u appt.

## 2021-08-21 ENCOUNTER — Encounter: Payer: Self-pay | Admitting: Cardiovascular Disease

## 2021-11-19 DIAGNOSIS — T8619 Other complication of kidney transplant: Secondary | ICD-10-CM | POA: Diagnosis not present

## 2021-11-19 DIAGNOSIS — I10 Essential (primary) hypertension: Secondary | ICD-10-CM | POA: Diagnosis not present

## 2021-11-19 DIAGNOSIS — Z940 Kidney transplant status: Secondary | ICD-10-CM | POA: Diagnosis not present

## 2021-11-19 DIAGNOSIS — M109 Gout, unspecified: Secondary | ICD-10-CM | POA: Diagnosis not present

## 2021-11-19 DIAGNOSIS — G4733 Obstructive sleep apnea (adult) (pediatric): Secondary | ICD-10-CM | POA: Diagnosis not present

## 2021-11-19 DIAGNOSIS — N2889 Other specified disorders of kidney and ureter: Secondary | ICD-10-CM | POA: Diagnosis not present

## 2021-11-19 DIAGNOSIS — Z79899 Other long term (current) drug therapy: Secondary | ICD-10-CM | POA: Diagnosis not present

## 2021-11-19 DIAGNOSIS — E7800 Pure hypercholesterolemia, unspecified: Secondary | ICD-10-CM | POA: Diagnosis not present

## 2021-11-19 DIAGNOSIS — Z5181 Encounter for therapeutic drug level monitoring: Secondary | ICD-10-CM | POA: Diagnosis not present

## 2021-11-26 DIAGNOSIS — T8619 Other complication of kidney transplant: Secondary | ICD-10-CM | POA: Diagnosis not present

## 2021-11-26 DIAGNOSIS — E785 Hyperlipidemia, unspecified: Secondary | ICD-10-CM | POA: Diagnosis not present

## 2021-11-26 DIAGNOSIS — D849 Immunodeficiency, unspecified: Secondary | ICD-10-CM | POA: Diagnosis not present

## 2021-11-26 DIAGNOSIS — G4733 Obstructive sleep apnea (adult) (pediatric): Secondary | ICD-10-CM | POA: Diagnosis not present

## 2021-11-26 DIAGNOSIS — Z79899 Other long term (current) drug therapy: Secondary | ICD-10-CM | POA: Diagnosis not present

## 2021-11-26 DIAGNOSIS — N2889 Other specified disorders of kidney and ureter: Secondary | ICD-10-CM | POA: Diagnosis not present

## 2021-11-26 DIAGNOSIS — K51919 Ulcerative colitis, unspecified with unspecified complications: Secondary | ICD-10-CM | POA: Diagnosis not present

## 2021-11-26 DIAGNOSIS — Z792 Long term (current) use of antibiotics: Secondary | ICD-10-CM | POA: Diagnosis not present

## 2021-11-26 DIAGNOSIS — Z940 Kidney transplant status: Secondary | ICD-10-CM | POA: Diagnosis not present

## 2021-11-26 DIAGNOSIS — D84821 Immunodeficiency due to drugs: Secondary | ICD-10-CM | POA: Diagnosis not present

## 2021-11-26 DIAGNOSIS — Z5181 Encounter for therapeutic drug level monitoring: Secondary | ICD-10-CM | POA: Diagnosis not present

## 2021-11-26 DIAGNOSIS — N186 End stage renal disease: Secondary | ICD-10-CM | POA: Diagnosis not present

## 2021-11-26 DIAGNOSIS — I120 Hypertensive chronic kidney disease with stage 5 chronic kidney disease or end stage renal disease: Secondary | ICD-10-CM | POA: Diagnosis not present

## 2021-12-03 DIAGNOSIS — T8619 Other complication of kidney transplant: Secondary | ICD-10-CM | POA: Diagnosis not present

## 2021-12-03 DIAGNOSIS — N1330 Unspecified hydronephrosis: Secondary | ICD-10-CM | POA: Diagnosis not present

## 2021-12-03 DIAGNOSIS — T888XXD Other specified complications of surgical and medical care, not elsewhere classified, subsequent encounter: Secondary | ICD-10-CM | POA: Diagnosis not present

## 2021-12-03 DIAGNOSIS — Z4822 Encounter for aftercare following kidney transplant: Secondary | ICD-10-CM | POA: Diagnosis not present

## 2021-12-03 DIAGNOSIS — N2889 Other specified disorders of kidney and ureter: Secondary | ICD-10-CM | POA: Diagnosis not present

## 2021-12-03 DIAGNOSIS — Z940 Kidney transplant status: Secondary | ICD-10-CM | POA: Diagnosis not present

## 2021-12-03 DIAGNOSIS — Z4682 Encounter for fitting and adjustment of non-vascular catheter: Secondary | ICD-10-CM | POA: Diagnosis not present

## 2021-12-14 DIAGNOSIS — T8619 Other complication of kidney transplant: Secondary | ICD-10-CM | POA: Diagnosis not present

## 2021-12-14 DIAGNOSIS — T888XXD Other specified complications of surgical and medical care, not elsewhere classified, subsequent encounter: Secondary | ICD-10-CM | POA: Diagnosis not present

## 2021-12-14 DIAGNOSIS — Z79899 Other long term (current) drug therapy: Secondary | ICD-10-CM | POA: Diagnosis not present

## 2021-12-14 DIAGNOSIS — K659 Peritonitis, unspecified: Secondary | ICD-10-CM | POA: Diagnosis not present

## 2021-12-14 DIAGNOSIS — N1330 Unspecified hydronephrosis: Secondary | ICD-10-CM | POA: Diagnosis not present

## 2021-12-14 DIAGNOSIS — Z4682 Encounter for fitting and adjustment of non-vascular catheter: Secondary | ICD-10-CM | POA: Diagnosis not present

## 2021-12-14 DIAGNOSIS — Z4822 Encounter for aftercare following kidney transplant: Secondary | ICD-10-CM | POA: Diagnosis not present

## 2021-12-14 DIAGNOSIS — N186 End stage renal disease: Secondary | ICD-10-CM | POA: Diagnosis not present

## 2021-12-14 DIAGNOSIS — Z940 Kidney transplant status: Secondary | ICD-10-CM | POA: Diagnosis not present

## 2021-12-14 DIAGNOSIS — D849 Immunodeficiency, unspecified: Secondary | ICD-10-CM | POA: Diagnosis not present

## 2021-12-15 DIAGNOSIS — T888XXA Other specified complications of surgical and medical care, not elsewhere classified, initial encounter: Secondary | ICD-10-CM | POA: Diagnosis not present

## 2021-12-15 DIAGNOSIS — Z4822 Encounter for aftercare following kidney transplant: Secondary | ICD-10-CM | POA: Diagnosis not present

## 2021-12-15 DIAGNOSIS — N2889 Other specified disorders of kidney and ureter: Secondary | ICD-10-CM | POA: Diagnosis not present

## 2021-12-15 DIAGNOSIS — I10 Essential (primary) hypertension: Secondary | ICD-10-CM | POA: Diagnosis not present

## 2021-12-15 DIAGNOSIS — T8619 Other complication of kidney transplant: Secondary | ICD-10-CM | POA: Diagnosis not present

## 2021-12-15 DIAGNOSIS — Z5181 Encounter for therapeutic drug level monitoring: Secondary | ICD-10-CM | POA: Diagnosis not present

## 2021-12-15 DIAGNOSIS — Z792 Long term (current) use of antibiotics: Secondary | ICD-10-CM | POA: Diagnosis not present

## 2021-12-15 DIAGNOSIS — T8131XA Disruption of external operation (surgical) wound, not elsewhere classified, initial encounter: Secondary | ICD-10-CM | POA: Diagnosis not present

## 2021-12-15 DIAGNOSIS — Z940 Kidney transplant status: Secondary | ICD-10-CM | POA: Diagnosis not present

## 2021-12-15 DIAGNOSIS — D849 Immunodeficiency, unspecified: Secondary | ICD-10-CM | POA: Diagnosis not present

## 2021-12-17 DIAGNOSIS — Z940 Kidney transplant status: Secondary | ICD-10-CM | POA: Diagnosis not present

## 2021-12-17 DIAGNOSIS — D849 Immunodeficiency, unspecified: Secondary | ICD-10-CM | POA: Diagnosis not present

## 2021-12-21 DIAGNOSIS — Z1159 Encounter for screening for other viral diseases: Secondary | ICD-10-CM | POA: Diagnosis not present

## 2021-12-21 DIAGNOSIS — Z9484 Stem cells transplant status: Secondary | ICD-10-CM | POA: Diagnosis not present

## 2021-12-21 DIAGNOSIS — B259 Cytomegaloviral disease, unspecified: Secondary | ICD-10-CM | POA: Diagnosis not present

## 2021-12-21 DIAGNOSIS — Z940 Kidney transplant status: Secondary | ICD-10-CM | POA: Diagnosis not present

## 2021-12-21 DIAGNOSIS — Z9483 Pancreas transplant status: Secondary | ICD-10-CM | POA: Diagnosis not present

## 2021-12-21 DIAGNOSIS — Z5181 Encounter for therapeutic drug level monitoring: Secondary | ICD-10-CM | POA: Diagnosis not present

## 2021-12-28 DIAGNOSIS — Z1159 Encounter for screening for other viral diseases: Secondary | ICD-10-CM | POA: Diagnosis not present

## 2021-12-28 DIAGNOSIS — B259 Cytomegaloviral disease, unspecified: Secondary | ICD-10-CM | POA: Diagnosis not present

## 2021-12-28 DIAGNOSIS — Z5181 Encounter for therapeutic drug level monitoring: Secondary | ICD-10-CM | POA: Diagnosis not present

## 2021-12-28 DIAGNOSIS — Z940 Kidney transplant status: Secondary | ICD-10-CM | POA: Diagnosis not present

## 2021-12-28 DIAGNOSIS — Z9484 Stem cells transplant status: Secondary | ICD-10-CM | POA: Diagnosis not present

## 2021-12-28 DIAGNOSIS — Z9483 Pancreas transplant status: Secondary | ICD-10-CM | POA: Diagnosis not present

## 2022-01-04 DIAGNOSIS — K689 Other disorders of retroperitoneum: Secondary | ICD-10-CM | POA: Diagnosis not present

## 2022-01-04 DIAGNOSIS — K659 Peritonitis, unspecified: Secondary | ICD-10-CM | POA: Diagnosis not present

## 2022-01-04 DIAGNOSIS — B259 Cytomegaloviral disease, unspecified: Secondary | ICD-10-CM | POA: Diagnosis not present

## 2022-01-04 DIAGNOSIS — T8141XA Infection following a procedure, superficial incisional surgical site, initial encounter: Secondary | ICD-10-CM | POA: Diagnosis not present

## 2022-01-04 DIAGNOSIS — D84821 Immunodeficiency due to drugs: Secondary | ICD-10-CM | POA: Diagnosis not present

## 2022-01-04 DIAGNOSIS — N186 End stage renal disease: Secondary | ICD-10-CM | POA: Diagnosis not present

## 2022-01-04 DIAGNOSIS — Z9483 Pancreas transplant status: Secondary | ICD-10-CM | POA: Diagnosis not present

## 2022-01-04 DIAGNOSIS — T8131XA Disruption of external operation (surgical) wound, not elsewhere classified, initial encounter: Secondary | ICD-10-CM | POA: Diagnosis not present

## 2022-01-04 DIAGNOSIS — Z940 Kidney transplant status: Secondary | ICD-10-CM | POA: Diagnosis not present

## 2022-01-04 DIAGNOSIS — Z9484 Stem cells transplant status: Secondary | ICD-10-CM | POA: Diagnosis not present

## 2022-01-04 DIAGNOSIS — Z8619 Personal history of other infectious and parasitic diseases: Secondary | ICD-10-CM | POA: Diagnosis not present

## 2022-01-04 DIAGNOSIS — Z1159 Encounter for screening for other viral diseases: Secondary | ICD-10-CM | POA: Diagnosis not present

## 2022-01-04 DIAGNOSIS — Z792 Long term (current) use of antibiotics: Secondary | ICD-10-CM | POA: Diagnosis not present

## 2022-01-04 DIAGNOSIS — Z5181 Encounter for therapeutic drug level monitoring: Secondary | ICD-10-CM | POA: Diagnosis not present

## 2022-01-04 DIAGNOSIS — Z79899 Other long term (current) drug therapy: Secondary | ICD-10-CM | POA: Diagnosis not present

## 2022-01-13 DIAGNOSIS — E785 Hyperlipidemia, unspecified: Secondary | ICD-10-CM | POA: Diagnosis not present

## 2022-01-13 DIAGNOSIS — G4733 Obstructive sleep apnea (adult) (pediatric): Secondary | ICD-10-CM | POA: Diagnosis not present

## 2022-01-13 DIAGNOSIS — Z940 Kidney transplant status: Secondary | ICD-10-CM | POA: Diagnosis not present

## 2022-01-13 DIAGNOSIS — Z4822 Encounter for aftercare following kidney transplant: Secondary | ICD-10-CM | POA: Diagnosis not present

## 2022-01-13 DIAGNOSIS — Z79899 Other long term (current) drug therapy: Secondary | ICD-10-CM | POA: Diagnosis not present

## 2022-01-13 DIAGNOSIS — D84821 Immunodeficiency due to drugs: Secondary | ICD-10-CM | POA: Diagnosis not present

## 2022-01-13 DIAGNOSIS — I10 Essential (primary) hypertension: Secondary | ICD-10-CM | POA: Diagnosis not present

## 2022-01-13 DIAGNOSIS — N2889 Other specified disorders of kidney and ureter: Secondary | ICD-10-CM | POA: Diagnosis not present

## 2022-01-13 DIAGNOSIS — T8619 Other complication of kidney transplant: Secondary | ICD-10-CM | POA: Diagnosis not present

## 2022-01-13 DIAGNOSIS — D849 Immunodeficiency, unspecified: Secondary | ICD-10-CM | POA: Diagnosis not present

## 2022-01-13 DIAGNOSIS — Z298 Encounter for other specified prophylactic measures: Secondary | ICD-10-CM | POA: Diagnosis not present

## 2022-01-13 DIAGNOSIS — T888XXD Other specified complications of surgical and medical care, not elsewhere classified, subsequent encounter: Secondary | ICD-10-CM | POA: Diagnosis not present

## 2022-02-03 DIAGNOSIS — Z5181 Encounter for therapeutic drug level monitoring: Secondary | ICD-10-CM | POA: Diagnosis not present

## 2022-02-03 DIAGNOSIS — Z9484 Stem cells transplant status: Secondary | ICD-10-CM | POA: Diagnosis not present

## 2022-02-03 DIAGNOSIS — Z1159 Encounter for screening for other viral diseases: Secondary | ICD-10-CM | POA: Diagnosis not present

## 2022-02-03 DIAGNOSIS — B259 Cytomegaloviral disease, unspecified: Secondary | ICD-10-CM | POA: Diagnosis not present

## 2022-02-03 DIAGNOSIS — Z9483 Pancreas transplant status: Secondary | ICD-10-CM | POA: Diagnosis not present

## 2022-02-03 DIAGNOSIS — Z940 Kidney transplant status: Secondary | ICD-10-CM | POA: Diagnosis not present

## 2022-02-10 DIAGNOSIS — Z9484 Stem cells transplant status: Secondary | ICD-10-CM | POA: Diagnosis not present

## 2022-02-10 DIAGNOSIS — Z9483 Pancreas transplant status: Secondary | ICD-10-CM | POA: Diagnosis not present

## 2022-02-10 DIAGNOSIS — Z1159 Encounter for screening for other viral diseases: Secondary | ICD-10-CM | POA: Diagnosis not present

## 2022-02-10 DIAGNOSIS — Z5181 Encounter for therapeutic drug level monitoring: Secondary | ICD-10-CM | POA: Diagnosis not present

## 2022-02-10 DIAGNOSIS — B259 Cytomegaloviral disease, unspecified: Secondary | ICD-10-CM | POA: Diagnosis not present

## 2022-02-10 DIAGNOSIS — Z940 Kidney transplant status: Secondary | ICD-10-CM | POA: Diagnosis not present

## 2022-02-24 ENCOUNTER — Ambulatory Visit (HOSPITAL_COMMUNITY)
Admission: RE | Admit: 2022-02-24 | Discharge: 2022-02-24 | Disposition: A | Discharge status: Home / Self Care | Payer: Medicare Other | Source: Ambulatory Visit | Attending: Gastroenterology | Admitting: Gastroenterology

## 2022-02-24 ENCOUNTER — Other Ambulatory Visit: Payer: Self-pay | Admitting: Gastroenterology

## 2022-02-24 ENCOUNTER — Encounter (HOSPITAL_COMMUNITY): Payer: Self-pay | Admitting: Radiology

## 2022-02-24 ENCOUNTER — Other Ambulatory Visit (HOSPITAL_COMMUNITY): Payer: Self-pay | Admitting: Gastroenterology

## 2022-02-24 DIAGNOSIS — K6289 Other specified diseases of anus and rectum: Secondary | ICD-10-CM | POA: Insufficient documentation

## 2022-02-24 DIAGNOSIS — K5100 Ulcerative (chronic) pancolitis without complications: Secondary | ICD-10-CM | POA: Diagnosis not present

## 2022-02-24 DIAGNOSIS — K5190 Ulcerative colitis, unspecified, without complications: Secondary | ICD-10-CM | POA: Diagnosis not present

## 2022-02-24 DIAGNOSIS — N3289 Other specified disorders of bladder: Secondary | ICD-10-CM | POA: Diagnosis not present

## 2022-02-24 DIAGNOSIS — N186 End stage renal disease: Secondary | ICD-10-CM | POA: Diagnosis not present

## 2022-02-24 MED ORDER — GADOBUTROL 1 MMOL/ML IV SOLN
10.0000 mL | Freq: Once | INTRAVENOUS | Status: AC | PRN
  Administered 2022-02-24: 10 mL via INTRAVENOUS

## 2022-02-25 DIAGNOSIS — B259 Cytomegaloviral disease, unspecified: Secondary | ICD-10-CM | POA: Diagnosis not present

## 2022-02-25 DIAGNOSIS — Z5181 Encounter for therapeutic drug level monitoring: Secondary | ICD-10-CM | POA: Diagnosis not present

## 2022-02-25 DIAGNOSIS — Z9483 Pancreas transplant status: Secondary | ICD-10-CM | POA: Diagnosis not present

## 2022-02-25 DIAGNOSIS — Z940 Kidney transplant status: Secondary | ICD-10-CM | POA: Diagnosis not present

## 2022-02-25 DIAGNOSIS — Z1159 Encounter for screening for other viral diseases: Secondary | ICD-10-CM | POA: Diagnosis not present

## 2022-02-25 DIAGNOSIS — Z9484 Stem cells transplant status: Secondary | ICD-10-CM | POA: Diagnosis not present

## 2022-02-26 DIAGNOSIS — K610 Anal abscess: Secondary | ICD-10-CM | POA: Diagnosis not present

## 2022-03-01 DIAGNOSIS — K689 Other disorders of retroperitoneum: Secondary | ICD-10-CM | POA: Diagnosis not present

## 2022-03-01 DIAGNOSIS — K659 Peritonitis, unspecified: Secondary | ICD-10-CM | POA: Diagnosis not present

## 2022-03-09 DIAGNOSIS — Z940 Kidney transplant status: Secondary | ICD-10-CM | POA: Diagnosis not present

## 2022-03-09 DIAGNOSIS — Z5181 Encounter for therapeutic drug level monitoring: Secondary | ICD-10-CM | POA: Diagnosis not present

## 2022-03-09 DIAGNOSIS — Z1159 Encounter for screening for other viral diseases: Secondary | ICD-10-CM | POA: Diagnosis not present

## 2022-03-09 DIAGNOSIS — Z9483 Pancreas transplant status: Secondary | ICD-10-CM | POA: Diagnosis not present

## 2022-03-09 DIAGNOSIS — Z9484 Stem cells transplant status: Secondary | ICD-10-CM | POA: Diagnosis not present

## 2022-03-09 DIAGNOSIS — B259 Cytomegaloviral disease, unspecified: Secondary | ICD-10-CM | POA: Diagnosis not present

## 2022-03-24 DIAGNOSIS — D84821 Immunodeficiency due to drugs: Secondary | ICD-10-CM | POA: Diagnosis not present

## 2022-03-24 DIAGNOSIS — Z79621 Long term (current) use of calcineurin inhibitor: Secondary | ICD-10-CM | POA: Diagnosis not present

## 2022-03-24 DIAGNOSIS — Z8619 Personal history of other infectious and parasitic diseases: Secondary | ICD-10-CM | POA: Diagnosis not present

## 2022-03-24 DIAGNOSIS — T8619 Other complication of kidney transplant: Secondary | ICD-10-CM | POA: Diagnosis not present

## 2022-03-24 DIAGNOSIS — Z4822 Encounter for aftercare following kidney transplant: Secondary | ICD-10-CM | POA: Diagnosis not present

## 2022-03-24 DIAGNOSIS — Z792 Long term (current) use of antibiotics: Secondary | ICD-10-CM | POA: Diagnosis not present

## 2022-03-24 DIAGNOSIS — Z7982 Long term (current) use of aspirin: Secondary | ICD-10-CM | POA: Diagnosis not present

## 2022-03-24 DIAGNOSIS — Z940 Kidney transplant status: Secondary | ICD-10-CM | POA: Diagnosis not present

## 2022-03-24 DIAGNOSIS — Z7952 Long term (current) use of systemic steroids: Secondary | ICD-10-CM | POA: Diagnosis not present

## 2022-03-24 DIAGNOSIS — Z79899 Other long term (current) drug therapy: Secondary | ICD-10-CM | POA: Diagnosis not present

## 2022-03-24 DIAGNOSIS — D849 Immunodeficiency, unspecified: Secondary | ICD-10-CM | POA: Diagnosis not present

## 2022-03-24 DIAGNOSIS — I10 Essential (primary) hypertension: Secondary | ICD-10-CM | POA: Diagnosis not present

## 2022-04-03 ENCOUNTER — Emergency Department (HOSPITAL_COMMUNITY): Payer: Medicare Other

## 2022-04-03 ENCOUNTER — Encounter (HOSPITAL_COMMUNITY): Payer: Self-pay

## 2022-04-03 ENCOUNTER — Other Ambulatory Visit: Payer: Self-pay

## 2022-04-03 ENCOUNTER — Emergency Department (HOSPITAL_COMMUNITY)
Admission: EM | Admit: 2022-04-03 | Discharge: 2022-04-03 | Disposition: A | Discharge status: Other Acute Inpatient Hospital | Payer: Medicare Other | Attending: Emergency Medicine | Admitting: Emergency Medicine

## 2022-04-03 ENCOUNTER — Ambulatory Visit (HOSPITAL_COMMUNITY)
Admission: EM | Admit: 2022-04-03 | Discharge: 2022-04-03 | Disposition: A | Discharge status: Home / Self Care | Payer: Medicare Other | Attending: Physician Assistant | Admitting: Physician Assistant

## 2022-04-03 ENCOUNTER — Other Ambulatory Visit (HOSPITAL_COMMUNITY): Payer: Medicare Other

## 2022-04-03 DIAGNOSIS — R748 Abnormal levels of other serum enzymes: Secondary | ICD-10-CM | POA: Diagnosis not present

## 2022-04-03 DIAGNOSIS — R059 Cough, unspecified: Secondary | ICD-10-CM | POA: Diagnosis not present

## 2022-04-03 DIAGNOSIS — T8619 Other complication of kidney transplant: Secondary | ICD-10-CM | POA: Diagnosis not present

## 2022-04-03 DIAGNOSIS — G8929 Other chronic pain: Secondary | ICD-10-CM | POA: Diagnosis not present

## 2022-04-03 DIAGNOSIS — R109 Unspecified abdominal pain: Secondary | ICD-10-CM | POA: Diagnosis not present

## 2022-04-03 DIAGNOSIS — Z7952 Long term (current) use of systemic steroids: Secondary | ICD-10-CM | POA: Diagnosis not present

## 2022-04-03 DIAGNOSIS — R6510 Systemic inflammatory response syndrome (SIRS) of non-infectious origin without acute organ dysfunction: Secondary | ICD-10-CM

## 2022-04-03 DIAGNOSIS — Z888 Allergy status to other drugs, medicaments and biological substances status: Secondary | ICD-10-CM | POA: Diagnosis not present

## 2022-04-03 DIAGNOSIS — Z20822 Contact with and (suspected) exposure to covid-19: Secondary | ICD-10-CM | POA: Diagnosis not present

## 2022-04-03 DIAGNOSIS — D84821 Immunodeficiency due to drugs: Secondary | ICD-10-CM | POA: Diagnosis not present

## 2022-04-03 DIAGNOSIS — Z882 Allergy status to sulfonamides status: Secondary | ICD-10-CM | POA: Diagnosis not present

## 2022-04-03 DIAGNOSIS — D849 Immunodeficiency, unspecified: Secondary | ICD-10-CM | POA: Diagnosis not present

## 2022-04-03 DIAGNOSIS — R9389 Abnormal findings on diagnostic imaging of other specified body structures: Secondary | ICD-10-CM | POA: Diagnosis not present

## 2022-04-03 DIAGNOSIS — R062 Wheezing: Secondary | ICD-10-CM | POA: Diagnosis not present

## 2022-04-03 DIAGNOSIS — R519 Headache, unspecified: Secondary | ICD-10-CM | POA: Insufficient documentation

## 2022-04-03 DIAGNOSIS — I120 Hypertensive chronic kidney disease with stage 5 chronic kidney disease or end stage renal disease: Secondary | ICD-10-CM | POA: Insufficient documentation

## 2022-04-03 DIAGNOSIS — J189 Pneumonia, unspecified organism: Secondary | ICD-10-CM | POA: Diagnosis not present

## 2022-04-03 DIAGNOSIS — Z7982 Long term (current) use of aspirin: Secondary | ICD-10-CM | POA: Diagnosis not present

## 2022-04-03 DIAGNOSIS — Z79621 Long term (current) use of calcineurin inhibitor: Secondary | ICD-10-CM | POA: Diagnosis not present

## 2022-04-03 DIAGNOSIS — R9431 Abnormal electrocardiogram [ECG] [EKG]: Secondary | ICD-10-CM | POA: Diagnosis not present

## 2022-04-03 DIAGNOSIS — N186 End stage renal disease: Secondary | ICD-10-CM | POA: Diagnosis not present

## 2022-04-03 DIAGNOSIS — R509 Fever, unspecified: Secondary | ICD-10-CM | POA: Diagnosis not present

## 2022-04-03 DIAGNOSIS — R000 Tachycardia, unspecified: Secondary | ICD-10-CM

## 2022-04-03 DIAGNOSIS — E119 Type 2 diabetes mellitus without complications: Secondary | ICD-10-CM | POA: Diagnosis not present

## 2022-04-03 DIAGNOSIS — Z940 Kidney transplant status: Secondary | ICD-10-CM

## 2022-04-03 DIAGNOSIS — Z79899 Other long term (current) drug therapy: Secondary | ICD-10-CM | POA: Diagnosis not present

## 2022-04-03 DIAGNOSIS — R918 Other nonspecific abnormal finding of lung field: Secondary | ICD-10-CM | POA: Diagnosis not present

## 2022-04-03 DIAGNOSIS — Z79624 Long term (current) use of inhibitors of nucleotide synthesis: Secondary | ICD-10-CM | POA: Diagnosis not present

## 2022-04-03 DIAGNOSIS — I10 Essential (primary) hypertension: Secondary | ICD-10-CM | POA: Diagnosis not present

## 2022-04-03 LAB — COMPREHENSIVE METABOLIC PANEL
ALT: 13 U/L (ref 0–44)
AST: 15 U/L (ref 15–41)
Albumin: 3.6 g/dL (ref 3.5–5.0)
Alkaline Phosphatase: 740 U/L — ABNORMAL HIGH (ref 38–126)
Anion gap: 10 (ref 5–15)
BUN: 15 mg/dL (ref 6–20)
CO2: 20 mmol/L — ABNORMAL LOW (ref 22–32)
Calcium: 9.6 mg/dL (ref 8.9–10.3)
Chloride: 105 mmol/L (ref 98–111)
Creatinine, Ser: 1.77 mg/dL — ABNORMAL HIGH (ref 0.61–1.24)
GFR, Estimated: 44 mL/min — ABNORMAL LOW (ref >60)
Glucose, Bld: 123 mg/dL — ABNORMAL HIGH (ref 70–99)
Potassium: 3.8 mmol/L (ref 3.5–5.1)
Sodium: 135 mmol/L (ref 135–145)
Total Bilirubin: 1.6 mg/dL — ABNORMAL HIGH (ref 0.3–1.2)
Total Protein: 7.4 g/dL (ref 6.5–8.1)

## 2022-04-03 LAB — URINALYSIS, ROUTINE W REFLEX MICROSCOPIC
Bilirubin Urine: NEGATIVE
Glucose, UA: NEGATIVE mg/dL
Ketones, ur: NEGATIVE mg/dL
Leukocytes,Ua: NEGATIVE
Nitrite: NEGATIVE
Protein, ur: >=300 mg/dL — AB
Specific Gravity, Urine: 1.029 (ref 1.005–1.030)
pH: 5 (ref 5.0–8.0)

## 2022-04-03 LAB — LACTIC ACID, PLASMA: Lactic Acid, Venous: 1 mmol/L (ref 0.5–1.9)

## 2022-04-03 LAB — POCT URINALYSIS DIPSTICK, ED / UC
Glucose, UA: NEGATIVE mg/dL
Ketones, ur: NEGATIVE mg/dL
Leukocytes,Ua: NEGATIVE
Nitrite: NEGATIVE
Protein, ur: >=300 mg/dL — AB
Specific Gravity, Urine: 1.025 (ref 1.005–1.030)
Urobilinogen, UA: 1 mg/dL (ref 0.0–1.0)
pH: 5.5 (ref 5.0–8.0)

## 2022-04-03 LAB — CBC WITH DIFFERENTIAL/PLATELET
Abs Immature Granulocytes: 0 10*3/uL (ref 0.00–0.07)
Basophils Absolute: 0 10*3/uL (ref 0.0–0.1)
Basophils Relative: 0 %
Eosinophils Absolute: 0 10*3/uL (ref 0.0–0.5)
Eosinophils Relative: 0 %
HCT: 36.2 % — ABNORMAL LOW (ref 39.0–52.0)
Hemoglobin: 11.9 g/dL — ABNORMAL LOW (ref 13.0–17.0)
Lymphocytes Relative: 3 %
Lymphs Abs: 0.3 10*3/uL — ABNORMAL LOW (ref 0.7–4.0)
MCH: 33.4 pg (ref 26.0–34.0)
MCHC: 32.9 g/dL (ref 30.0–36.0)
MCV: 101.7 fL — ABNORMAL HIGH (ref 80.0–100.0)
Monocytes Absolute: 1 10*3/uL (ref 0.1–1.0)
Monocytes Relative: 11 %
Neutro Abs: 7.8 10*3/uL — ABNORMAL HIGH (ref 1.7–7.7)
Neutrophils Relative %: 86 %
Platelets: 212 10*3/uL (ref 150–400)
RBC: 3.56 MIL/uL — ABNORMAL LOW (ref 4.22–5.81)
RDW: 13 % (ref 11.5–15.5)
WBC: 9.1 10*3/uL (ref 4.0–10.5)
nRBC: 0 % (ref 0.0–0.2)
nRBC: 1 /100 WBC — ABNORMAL HIGH

## 2022-04-03 LAB — RESP PANEL BY RT-PCR (FLU A&B, COVID) ARPGX2
Influenza A by PCR: NEGATIVE
Influenza B by PCR: NEGATIVE
SARS Coronavirus 2 by RT PCR: NEGATIVE

## 2022-04-03 LAB — PROTIME-INR
INR: 1.1 (ref 0.8–1.2)
Prothrombin Time: 14.5 seconds (ref 11.4–15.2)

## 2022-04-03 LAB — APTT: aPTT: 32 seconds (ref 24–36)

## 2022-04-03 MED ORDER — VANCOMYCIN HCL 2000 MG/400ML IV SOLN
2000.0000 mg | Freq: Once | INTRAVENOUS | Status: AC
  Administered 2022-04-03: 2000 mg via INTRAVENOUS
  Filled 2022-04-03: qty 400

## 2022-04-03 MED ORDER — DIPHENHYDRAMINE HCL 50 MG/ML IJ SOLN
25.0000 mg | Freq: Once | INTRAMUSCULAR | Status: AC
Start: 2022-04-03 — End: 2022-04-03
  Administered 2022-04-03: 25 mg via INTRAVENOUS
  Filled 2022-04-03: qty 1

## 2022-04-03 MED ORDER — CEFEPIME HCL 2 G IV SOLR
2.0000 g | Freq: Once | INTRAVENOUS | Status: AC
  Administered 2022-04-03: 2 g via INTRAVENOUS
  Filled 2022-04-03: qty 12.5

## 2022-04-03 MED ORDER — PROCHLORPERAZINE EDISYLATE 10 MG/2ML IJ SOLN
10.0000 mg | Freq: Once | INTRAMUSCULAR | Status: AC
  Administered 2022-04-03: 10 mg via INTRAVENOUS
  Filled 2022-04-03: qty 2

## 2022-04-03 MED ORDER — SODIUM CHLORIDE 0.9 % IV SOLN: 2.0000 g | Freq: Two times a day (BID) | INTRAVENOUS | Status: DC

## 2022-04-03 MED ORDER — ACETAMINOPHEN 325 MG PO TABS
975.0000 mg | ORAL_TABLET | Freq: Once | ORAL | Status: AC
  Administered 2022-04-03: 975 mg via ORAL

## 2022-04-03 MED ORDER — VANCOMYCIN HCL IN DEXTROSE 1-5 GM/200ML-% IV SOLN: 1000.0000 mg | INTRAVENOUS | Status: DC

## 2022-04-03 MED ORDER — FENTANYL CITRATE PF 50 MCG/ML IJ SOSY
50.0000 ug | PREFILLED_SYRINGE | Freq: Once | INTRAMUSCULAR | Status: AC
  Administered 2022-04-03: 50 ug via INTRAVENOUS
  Filled 2022-04-03: qty 1

## 2022-04-03 MED ORDER — LACTATED RINGERS IV SOLN: INTRAVENOUS | Status: DC

## 2022-04-03 MED ORDER — ACETAMINOPHEN 325 MG PO TABS
ORAL_TABLET | ORAL | Status: AC
  Filled 2022-04-03: qty 3

## 2022-04-03 MED ORDER — LACTATED RINGERS IV BOLUS (SEPSIS)
1000.0000 mL | Freq: Once | INTRAVENOUS | Status: AC
  Administered 2022-04-03: 1000 mL via INTRAVENOUS

## 2022-04-03 MED ORDER — VANCOMYCIN HCL IN DEXTROSE 1-5 GM/200ML-% IV SOLN: 1000.0000 mg | Freq: Once | INTRAVENOUS | Status: DC

## 2022-04-03 MED ORDER — METRONIDAZOLE 500 MG/100ML IV SOLN
500.0000 mg | Freq: Once | INTRAVENOUS | Status: AC
  Administered 2022-04-03: 500 mg via INTRAVENOUS
  Filled 2022-04-03: qty 100

## 2022-04-03 NOTE — ED Notes (Signed)
Patient transported to CT 

## 2022-04-03 NOTE — Progress Notes (Signed)
Pharmacy Antibiotic Note  Colin [name] is a 56 y.o. male for which pharmacy has been consulted for cefepime and vancomycin dosing for sepsis.  Patient with a history of anemia, arthritis, CKD, Gout, HTN, perupheral neuropathy, pna, UC. Patient presenting with fever.  SCr 1.77 - (1.8 on 6/7) WBC 9.1; LA 1; T 100.3 F; HR 121; RR 17  Plan: Metronidazole per MD Cefepime 2g q12hr Vancomycin 2000 mg once then 1000 mg q24hr (eAUC 512) unless change in renal function Trend WBC, Fever, Renal function, & Clinical course F/u cultures, clinical course, WBC, fever De-escalate when able Levels at steady state     Temp (24hrs), Avg:101.7 F (38.7 C), Min:100.3 F (37.9 C), Max:103.1 F (39.5 C)  Recent Labs  Lab 04/03/22 1102 04/03/22 1103  WBC 9.1  --   CREATININE 1.77*  --   LATICACIDVEN  --  1.0    CrCl cannot be calculated (Unknown ideal weight.).    Allergies  Allergen Reactions   Sulfa Antibiotics Shortness Of Breath and Rash    Hives, itching   Antimicrobials this admission: cefepime 6/17 >>  vancomycin 6/17 >>  metronidazole 6/17 >>   Microbiology results: Pending  Thank you for allowing pharmacy to be a part of this patient's care.  Lorelei Pont, PharmD, BCPS 04/03/2022 12:20 PM ED Clinical Pharmacist -  [phone]

## 2022-04-03 NOTE — ED Provider Notes (Signed)
[facility] EMERGENCY DEPARTMENT Provider Note   CSN: [id number] Arrival date & time: 04/03/22  1039     History  Chief Complaint  Patient presents with   Fever    Colin [name] is a 56 y.o. male.  HPI     56 y.o. male with ulcerative colitis, end-stage renal disease status post renal transplant in November 2022 on tacro, CellCept, hypertension comes into the ER with chief complaint of fever  Patient states that he started feeling unwell on Thursday.  He started having off-and-on headaches at that time and malaise.  Friday he was feeling worse.  Family checked his temp yesterday, noted that his fever was as high as 103.  Patient states that he is having some lower back pain, nausea, dry cough as his primary symptoms.  Headaches are more severe today but there is no neck pain associated with it.  Headaches are frontal, sharp and there is some associated photophobia.  Patient also had a slight irritation with urine described as burning sensation.  Patient took home COVID test that is negative.  He has mild runny nose, denies any sore throat or sick contacts.  Home Medications Prior to Admission medications   Medication Sig Start Date End Date Taking? Authorizing Provider  allopurinol (ZYLOPRIM) 100 MG tablet Take 100 mg by mouth daily.    [provider]  amitriptyline (ELAVIL) 25 MG tablet Take 75 mg by mouth at bedtime.    [provider]  calcitRIOL (ROCALTROL) 0.25 MCG capsule Take 0.25 mcg by mouth daily. 03/09/21   [provider]  cinacalcet (SENSIPAR) 30 MG tablet Take 30 mg by mouth at bedtime. 04/06/21   [provider]  ferrous sulfate 325 (65 FE) MG tablet Take 325 mg by mouth at bedtime.    [provider]  HUMIRA PEN 40 MG/0.4ML PNKT Inject 40 mg/mL as directed See admin instructions. 40 mg/0.3m injection every two weeks (Friday/Saturday) 02/02/21   [provider]  LOKELMA 10 g PACK packet Take 1  packet by mouth at bedtime. 04/06/21   [provider]  Methoxy PEG-Epoetin Beta (MIRCERA IJ) Inject into the skin. Dialysis Med 02/10/21   [provider]  multivitamin (RENA-VIT) TABS tablet Take 1 tablet by mouth daily. 02/26/21   [provider]  PEPCID 20 MG tablet Take 20 mg by mouth every other day.  06/06/20   [provider]  sevelamer carbonate (RENVELA) 800 MG tablet Take 3,200 mg by mouth See admin instructions. With meals and snacks Patient not taking: Reported on 07/20/2021 03/13/21   [provider]  traZODone (DESYREL) 100 MG tablet Take 100 mg by mouth at bedtime as needed for sleep. 02/25/21   [provider]      Allergies    Sulfa antibiotics    Review of Systems   Review of Systems  All other systems reviewed and are negative.   Physical Exam Updated Vital Signs BP (!) 154/92   Pulse 90   Temp 99.6 F (37.6 C) (Oral)   Resp (!) 32   SpO2 100%  Physical Exam Vitals and nursing note reviewed.  Constitutional:      Appearance: He is well-developed.  HENT:     Head: Atraumatic.  Eyes:     Extraocular Movements: Extraocular movements intact.     Pupils: Pupils are equal, round, and reactive to light.  Neck:     Comments: No meningismus Cardiovascular:     Rate and Rhythm: Tachycardia present.  Pulmonary:     Effort: Pulmonary effort is normal.     Breath sounds: No wheezing, rhonchi or rales.  Abdominal:     Tenderness: There is no abdominal tenderness.  Musculoskeletal:     Cervical back: Neck supple.     Comments: No midline lumbar or thoracic spine tenderness.  Patient is having paraspinal tenderness over the lumbar region.  No flank tenderness.  Skin:    General: Skin is warm.     Coloration: Skin is not jaundiced.  Neurological:     Mental Status: He is alert and oriented to person, place, and time.     ED Results / Procedures / Treatments   Labs (all labs ordered are listed, but only abnormal  results are displayed) Labs Reviewed  COMPREHENSIVE METABOLIC PANEL - Abnormal; Notable for the following components:      Result Value   CO2 20 (*)    Glucose, Bld 123 (*)    Creatinine, Ser 1.77 (*)    Alkaline Phosphatase 740 (*)    Total Bilirubin 1.6 (*)    GFR, Estimated 44 (*)    All other components within normal limits  CBC WITH DIFFERENTIAL/PLATELET - Abnormal; Notable for the following components:   RBC 3.56 (*)    Hemoglobin 11.9 (*)    HCT 36.2 (*)    MCV 101.7 (*)    Neutro Abs 7.8 (*)    Lymphs Abs 0.3 (*)    nRBC 1 (*)    All other components within normal limits  URINALYSIS, ROUTINE W REFLEX MICROSCOPIC - Abnormal; Notable for the following components:   Color, Urine AMBER (*)    APPearance HAZY (*)    Hgb urine dipstick SMALL (*)    Protein, ur >=300 (*)    Bacteria, UA RARE (*)    All other components within normal limits  RESP PANEL BY RT-PCR (FLU A&B, COVID) ARPGX2  CULTURE, BLOOD (ROUTINE X 2)  CULTURE, BLOOD (ROUTINE X 2)  URINE CULTURE  LACTIC ACID, PLASMA  PROTIME-INR  APTT    EKG EKG Interpretation  Date/Time:  Saturday April 03 2022 13:46:26 EDT Ventricular Rate:  92 PR Interval:  143 QRS Duration: 83 QT Interval:  336 QTC Calculation: 416 R Axis:   61 Text Interpretation: Sinus rhythm No acute changes No significant change since last tracing Confirmed by Varney Biles [phone]) on 04/03/2022 2:14:29 PM  Radiology CT ABDOMEN PELVIS WO CONTRAST  Result Date: 04/03/2022 CLINICAL DATA:  Abdominal pain, sepsis EXAM: CT ABDOMEN AND PELVIS WITHOUT CONTRAST TECHNIQUE: Multidetector CT imaging of the abdomen and pelvis was performed following the standard protocol without IV contrast. RADIATION DOSE REDUCTION: This exam was performed according to the departmental dose-optimization program which includes automated exposure control, adjustment of the mA and/or kV according to patient size and/or use of iterative reconstruction technique. COMPARISON:   11/19/2020 FINDINGS: Lower chest: There is new patchy alveolar infiltrate in the posteromedial right lower lung fields suggesting atelectasis/pneumonia. Scattered coronary artery calcifications are seen. Hepatobiliary: Liver measures 19.1 cm in length. There is no dilation of bile ducts. Gallbladder is unremarkable. Pancreas: No focal abnormality is seen. Spleen: Unremarkable. Adrenals/Urinary Tract: Adrenals are unremarkable. Both native kidneys are smaller than usual in size. There is no hydronephrosis. There are no renal or ureteral stones. There is renal transplant in the right iliac fossa. There is no hydronephrosis in the renal transplant. There is 4 x 2.6 cm fluid density structure in the posteromedial aspect of renal transplant possibly a cyst.  Urinary bladder is not distended. Stomach/Bowel: Stomach is not distended. Small bowel loops are not dilated. Appendix is not dilated. There is no significant wall thickening in colon. Scattered diverticula are seen in the colon without signs of focal diverticulitis. Vascular/Lymphatic: Scattered arterial calcifications are seen. Reproductive: Unremarkable. Other: There is no ascites or pneumoperitoneum. Musculoskeletal: No acute findings are seen. IMPRESSION: There is new patchy alveolar infiltrate in the right lower lobe suggesting pneumonia. There is no evidence of intestinal obstruction or pneumoperitoneum. Appendix is not dilated. There is no evidence of hydronephrosis in the native kidneys and renal transplant in the right iliac fossa. There are scattered coronary artery calcifications. Possible cyst is seen in the renal transplant. Few diverticula are seen in colon without signs of focal diverticulitis. Electronically Signed   By: Elmer Picker M.D.   On: 04/03/2022 13:50   CT Head Wo Contrast  Result Date: 04/03/2022 CLINICAL DATA:  Headaches EXAM: CT HEAD WITHOUT CONTRAST TECHNIQUE: Contiguous axial images were obtained from the base of the skull  through the vertex without intravenous contrast. RADIATION DOSE REDUCTION: This exam was performed according to the departmental dose-optimization program which includes automated exposure control, adjustment of the mA and/or kV according to patient size and/or use of iterative reconstruction technique. COMPARISON:  MR brain done on 05/01/2017 FINDINGS: Brain: Ventricles are not dilated. There is no shift of midline structures. There are no epidural or subdural fluid collections. There is no focal edema or mass effect. Cortical sulci are prominent. Vascular: Unremarkable. Skull: Unremarkable. Sinuses/Orbits: There is mild mucosal thickening in the ethmoid sinus. Other: None. IMPRESSION: No acute intracranial findings are seen in noncontrast CT brain. Electronically Signed   By: Elmer Picker M.D.   On: 04/03/2022 13:40   DG Chest 2 View  Result Date: 04/03/2022 CLINICAL DATA:  Cough, wheezing EXAM: CHEST - 2 VIEW COMPARISON:  Previous studies including the examination of 04/24/2021 FINDINGS: Transverse diameter of heart is slightly increased. There are no signs of pulmonary edema or focal consolidation. There is peribronchial thickening. Small linear densities seen in the lower lung fields. There is no pleural effusion or pneumothorax. IMPRESSION: Peribronchial thickening suggests bronchitis. Small linear densities in the lower lung fields suggest scarring or subsegmental atelectasis. There is no focal pulmonary consolidation. Electronically Signed   By: Elmer Picker M.D.   On: 04/03/2022 12:12    Procedures .Critical Care  Performed by: Varney Biles, MD Authorized by: Varney Biles, MD   Critical care provider statement:    Critical care time (minutes):  79   Critical care was necessary to treat or prevent imminent or life-threatening deterioration of the following conditions:  Sepsis   Critical care was time spent personally by me on the following activities:  Development of  treatment plan with patient or surrogate, discussions with consultants, evaluation of patient's response to treatment, examination of patient, ordering and review of laboratory studies, ordering and review of radiographic studies, ordering and performing treatments and interventions, pulse oximetry, re-evaluation of patient's condition and review of old charts     Medications Ordered in ED Medications  lactated ringers infusion ( Intravenous New Bag/Given 04/03/22 1333)  vancomycin (VANCOREADY) IVPB 2000 mg/400 mL (2,000 mg Intravenous New Bag/Given 04/03/22 1339)  ceFEPIme (MAXIPIME) 2 g in sodium chloride 0.9 % 100 mL IVPB (has no administration in time range)  vancomycin (VANCOCIN) IVPB 1000 mg/200 mL premix (has no administration in time range)  lactated ringers bolus 1,000 mL (0 mLs Intravenous Stopped 04/03/22 1445)  ceFEPIme (  MAXIPIME) 2 g in sodium chloride 0.9 % 100 mL IVPB (0 g Intravenous Stopped 04/03/22 1333)  metroNIDAZOLE (FLAGYL) IVPB 500 mg (0 mg Intravenous Stopped 04/03/22 1445)  prochlorperazine (COMPAZINE) injection 10 mg (10 mg Intravenous Given 04/03/22 1237)  diphenhydrAMINE (BENADRYL) injection 25 mg (25 mg Intravenous Given 04/03/22 1237)  fentaNYL (SUBLIMAZE) injection 50 mcg (50 mcg Intravenous Given 04/03/22 1237)    ED Course/ Medical Decision Making/ A&P Clinical Course as of 04/03/22 1450  Sat Apr 03, 2022  1238 Creatinine(!): 1.77 At baseline for patient posttransplant. [AN]  1238 Alkaline Phosphatase(!): 740 Patient had elevated alk phos earlier this year at [facility] [AN]  1330 CT Head Wo Contrast CT scan independently visualized and interpreted.  No evidence of brain bleed.  X-ray of the chest also visualized and interpreted independently.  No focal consolidation.  Per radiology, there is evidence of bronchitis.  Patient has received broad-spectrum antibiotics.  His headache is improved from 8 out of 10 to 3 out of 10.  He is feeling a lot comfortable.   Temperature has also improved.  Tachycardia resolved.  Awaiting call from transplant surgery at this time.   [AN]    Clinical Course User Index [AN] Varney Biles, MD                           Medical Decision Making Amount and/or Complexity of Data Reviewed Labs: ordered. Decision-making details documented in ED Course. Radiology: ordered. Decision-making details documented in ED Course. ECG/medicine tests: ordered.  Risk Prescription drug management.   56 year old male comes in with chief complaint of fevers.  He has history of ulcerative colitis, renal transplant at [facility] in November 2022 on immunosuppressive medications.  It appears that patient has been feeling unwell for the last 2 days. No clear source of infection on history.  The differential diagnosis includes : Cystitis, pyelonephritis, bacteremia, transplant complication including rejection, opportunistic infection such as CMV.  The initial plan is to initiate sepsis work-up.  We will get x-ray of the chest, COVID-19 test and also CT scan of the head.  We will touch base with the transplant team.  IV medications for headache ordered.   Additional history obtained: Additional history obtained from family Records reviewed Care Everywhere/External Records -reviewed outpatient labs with baseline creatinine, baseline alk phos.  Also reviewed transplant medications.  Independent labs interpretation:  The following labs were independently interpreted: Please see above  Independent visualization of imaging: - I independently visualized the following imaging with scope of interpretation limited to determining acute life threatening conditions related to emergency care: X-ray of the chest, which revealed no evidence of any focal consolidation.  Treatment and Reassessment: Patient feeling better as far as a headache is concerned.  Discussed with him that the CT scan is showing questionable pneumonia. Discussed with him that  [facility] team recommends that he be admitted to [facility].   Consultation: - Consulted or discussed management/test interpretation w/ external professional: Transplant team at [facility].  Dr. Lissa Merlin accepting  For now, transplant team does not want [place] to expand coverage to fungus or virus. I did ask him if patient should get LP with concerns for CMV, transplant team indicated that they will assess the patient for additional work-up as well.    Final Clinical Impression(s) / ED Diagnoses Final diagnoses:  SIRS (systemic inflammatory response syndrome) ([place])  Community acquired pneumonia, unspecified laterality  Kidney transplant pain    Rx / DC Orders ED Discharge  Orders     None         Varney Biles, MD 04/03/22 1451

## 2022-04-03 NOTE — ED Provider Notes (Signed)
[facility]    CSN: [id number] Arrival date & time: 04/03/22  1001      History   Chief Complaint Chief Complaint  Patient presents with   Fever    HPI Colin [name] is a 56 y.o. male.   Patient presents today with a 24-hour history of high fever.  He reports lower back pain, nausea without vomiting, fatigue/malaise, mild cough.  Denies any significant abdominal pain or changes in bowel habits.  Denies any known sick contacts.  He did take an at-home COVID test at the recommendation of his transplant team that was negative.  He has not had COVID in the past.  Took Tylenol yesterday for fever but has not had any medication today.  He had a kidney transplant November 2022 and is compliant with immunosuppressive regimen.  He did have 1 episode of dysuria when symptoms began but denies any ongoing urinary symptoms including frequency, urgency, dysuria.  Denies any recent antibiotic use.  He denies recent hospitalization, recent urogenital procedure, self catheterization.    Past Medical History:  Diagnosis Date   Anemia    iron deficiency   Arthritis    Gout   CKD (chronic kidney disease) stage 3, GFR 30-59 ml/min (HCC)    Stage 4   Gout    History of chicken pox    Hypertension    Melanosis coli    Mumps    Peripheral neuropathy    Pneumonia    Ulcerative colitis [place], [place]    Patient Active Problem List   Diagnosis Date Noted   Acute blood loss anemia 02/06/2021   Pneumonia due to COVID-19 virus 03/10/2019   Macrocytic anemia 03/10/2019   Acute hypoxemic respiratory failure ([place])    Hyperprolactinemia ([place]) 01/17/2018   Screening for malignant neoplasm of prostate 01/17/2018   Gynecomastia 01/17/2018   Ingrown toenail 06/21/2017   Onychomycosis 06/21/2017   ESRD (end stage renal disease) ([place]) 02/07/2017   Morbid obesity ([place]) 02/07/2017   Bilateral leg edema 10/02/2015   Hyperlipidemia 10/02/2015   Edema 02/09/2011   Chronic renal  insufficiency 02/09/2011   ANEMIA, IRON DEFICIENCY 10/16/2007   Essential hypertension 10/16/2007   Ulcerative colitis ([place]) 02/13/2007   MELANOSIS COLI 02/13/2007    Past Surgical History:  Procedure Laterality Date   AV FISTULA PLACEMENT Right 08/27/2016   Procedure: RIGHT RADIOCEPHALIC ARTERIOVENOUS FISTULA CREATION;  Surgeon: [name], MD;  Location: [facility];  Service: Vascular;  Laterality: Right;   AV FISTULA PLACEMENT Right 10/21/2017   Procedure: ARTERIOVENOUS (AV) FISTULA CREATION RIGHT ARM;  Surgeon: [name], MD;  Location: [facility];  Service: Vascular;  Laterality: Right;   COLONOSCOPY     FISTULA SUPERFICIALIZATION Right 11/12/2016   Procedure: FISTULA SUPERFICIALIZATION RIGHT FOREEARM;  Surgeon: [name], MD;  Location: [facility] OR;  Service: Vascular;  Laterality: Right;   kidney tranplant Right    REVISON OF ARTERIOVENOUS FISTULA Right 12/23/2017   Procedure: CEPHALIC VEIN TRANSPOSITION ARTERIOVENOUS FISTULA RIGHT UPPER ARM;  Surgeon: [name], MD;  Location: [facility] OR;  Service: Vascular;  Laterality: Right;   TENDON REPAIR  1987   Left index finger       Home Medications    Prior to Admission medications   Medication Sig Start Date End Date Taking? Authorizing Provider  allopurinol (ZYLOPRIM) 100 MG tablet Take 100 mg by mouth daily.    [provider]  amitriptyline (ELAVIL) 25 MG tablet Take 75 mg by mouth  at bedtime.    [provider]  calcitRIOL (ROCALTROL) 0.25 MCG capsule Take 0.25 mcg by mouth daily. 03/09/21   [provider]  cinacalcet (SENSIPAR) 30 MG tablet Take 30 mg by mouth at bedtime. 04/06/21   [provider]  ferrous sulfate 325 (65 FE) MG tablet Take 325 mg by mouth at bedtime.    [provider]  HUMIRA PEN 40 MG/0.4ML PNKT Inject 40 mg/mL as directed See admin instructions. 40 mg/0.76m injection every two weeks (Friday/Saturday) 02/02/21   [provider]  LOKELMA 10 g PACK packet Take 1  packet by mouth at bedtime. 04/06/21   [provider]  Methoxy PEG-Epoetin Beta (MIRCERA IJ) Inject into the skin. Dialysis Med 02/10/21   [provider]  multivitamin (RENA-VIT) TABS tablet Take 1 tablet by mouth daily. 02/26/21   [provider]  PEPCID 20 MG tablet Take 20 mg by mouth every other day.  06/06/20   [provider]  sevelamer carbonate (RENVELA) 800 MG tablet Take 3,200 mg by mouth See admin instructions. With meals and snacks Patient not taking: Reported on 07/20/2021 03/13/21   [provider]  traZODone (DESYREL) 100 MG tablet Take 100 mg by mouth at bedtime as needed for sleep. 02/25/21   [provider]    Family History Family History  Problem Relation Age of Onset   Hypertension Father    Heart disease Father    Renal Disease Father    Hypertension Mother     Social History Social History   Tobacco Use   Smoking status: Never   Smokeless tobacco: Former    Types: CNurse, children'sUse: Never used  Substance Use Topics   Alcohol use: Yes    Comment: rare   Drug use: No     Allergies   Sulfa antibiotics   Review of Systems Review of Systems  Constitutional:  Positive for activity change, fatigue and fever. Negative for appetite change.  Respiratory:  Positive for cough. Negative for shortness of breath.   Cardiovascular:  Negative for chest pain.  Gastrointestinal:  Positive for nausea. Negative for abdominal pain, diarrhea and vomiting.  Genitourinary:  Positive for dysuria. Negative for frequency and urgency.  Musculoskeletal:  Positive for back pain. Negative for arthralgias and myalgias.     Physical Exam Triage Vital Signs ED Triage Vitals  Enc Vitals Group     BP 04/03/22 1015 118/78     Pulse Rate 04/03/22 1015 (!) 125     Resp 04/03/22 1015 18     Temp 04/03/22 1015 (!) 103.1 F (39.5 C)     Temp Source 04/03/22 1015 Oral     SpO2 04/03/22 1015 93 %     Weight --       Height --      Head Circumference --      Peak Flow --      Pain Score 04/03/22 1016 6     Pain Loc --      Pain Edu? --      Excl. in [place] --    No data found.  Updated Vital Signs BP 118/78 (BP Location: Left Arm)   Pulse (!) 125   Temp (!) 103.1 F (39.5 C) (Oral)   Resp 18   SpO2 93%   Visual Acuity Right Eye Distance:   Left Eye Distance:   Bilateral Distance:    Right Eye Near:   Left Eye Near:  Bilateral Near:     Physical Exam Vitals reviewed.  Constitutional:      General: He is awake.     Appearance: Normal appearance. He is well-developed. He is not ill-appearing.     Comments: Very pleasant male appears stated age sitting in wheelchair in no acute distress  HENT:     Head: Normocephalic and atraumatic.     Mouth/Throat:     Pharynx: No oropharyngeal exudate, posterior oropharyngeal erythema or uvula swelling.  Cardiovascular:     Rate and Rhythm: Regular rhythm. Tachycardia present.     Heart sounds: Normal heart sounds, S1 normal and S2 normal. No murmur heard. Pulmonary:     Effort: Pulmonary effort is normal.     Breath sounds: Normal breath sounds. No stridor. No wheezing, rhonchi or rales.     Comments: Clear to auscultation bilaterally Abdominal:     General: Bowel sounds are normal.     Palpations: Abdomen is soft.     Tenderness: There is no abdominal tenderness. There is no right CVA tenderness, left CVA tenderness, guarding or rebound.     Comments: Benign abdominal exam  Musculoskeletal:     Lumbar back: No tenderness or bony tenderness.  Neurological:     Mental Status: He is alert.  Psychiatric:        Behavior: Behavior is cooperative.      UC Treatments / Results  Labs (all labs ordered are listed, but only abnormal results are displayed) Labs Reviewed  POCT URINALYSIS DIPSTICK, ED / UC - Abnormal; Notable for the following components:      Result Value   Bilirubin Urine SMALL (*)    Hgb urine dipstick SMALL (*)     Protein, ur >=300 (*)    All other components within normal limits    EKG   Radiology No results found.  Procedures Procedures (including critical care time)  Medications Ordered in UC Medications  acetaminophen (TYLENOL) tablet 975 mg (975 mg Oral Given 04/03/22 1033)    Initial Impression / Assessment and Plan / UC Course  I have reviewed the triage vital signs and the nursing notes.  Pertinent labs & imaging results that were available during my care of the patient were reviewed by me and considered in my medical decision making (see chart for details).     Concern for sepsis given fever and tachycardia.  Given patient's history of immunosuppression discussed that he would need to be evaluated in the emergency room as we do not have the capabilities of obtaining imaging or stat blood work.  Patient was agreeable to this and will go directly to [facility], ER.  His wife will take him by private vehicle; he was stable at discharge.  He was given 975 mg of Tylenol for fever prior to discharge.  Final Clinical Impressions(s) / UC Diagnoses   Final diagnoses:  SIRS (systemic inflammatory response syndrome) (HCC)  Tachycardia  Fever, unspecified fever cause  Immunosuppression ([place])  History of kidney transplant     Discharge Instructions      I am concerned that you have a systemic infection (sepsis).  Please go to the emergency room for further evaluation and management as we discussed.     ED Prescriptions   None    PDMP not reviewed this encounter.   Terrilee Croak, PA-C 04/03/22 1037

## 2022-04-03 NOTE — ED Notes (Signed)
Patient transported to X-ray 

## 2022-04-03 NOTE — ED Notes (Signed)
Called [facility] requesting transport, spoke with Shanon Brow who stated that they are short staffed tonight and won't have transport available until tomorrow morning and that "if situation becomes emergent you can call 911"

## 2022-04-03 NOTE — ED Notes (Signed)
[facility] has arrived. Called [facility] to cancel tomorrow's transport only.

## 2022-04-03 NOTE — Discharge Instructions (Signed)
I am concerned that you have a systemic infection (sepsis).  Please go to the emergency room for further evaluation and management as we discussed.

## 2022-04-03 NOTE — ED Notes (Signed)
Called report to Therapist, sports at [facility]

## 2022-04-03 NOTE — ED Triage Notes (Signed)
Pt states fever, cough, nausea, back pain ,urinary frequency dizziness,headache since yesterday. Tmax 103.0.  kidney transplant patient in November of 2022. Pt took Tylenol last pm for fever, nothing today.

## 2022-04-03 NOTE — ED Notes (Signed)
Called [employer], spoke to "Shanon Brow" who had additional medical questions regarding the patient.  RN Almyra Free was on the phone giving receiving RN report on this patient and could not answer the questions at that time. Made arrangements for call back. Reported to [employer]. Called [facility] regarding transport.  ETA approximately 1 hour.

## 2022-04-03 NOTE — Sepsis Progress Note (Signed)
[facility] following code sepsis °

## 2022-04-03 NOTE — ED Triage Notes (Addendum)
Patient sent from urgent care for fever and cough, negative home covid. Complains of back pain and nausea, renal transplant 2022. Alert and oriented. Tylenol last night and received 961m of tylenol pta.

## 2022-04-03 NOTE — ED Notes (Signed)
Patient is being discharged from the Urgent Care and sent to the Emergency Department via private vehicle . Per Junie Panning PA, patient is in need of higher level of care due to fever,kidney trasnplant patient. Patient is aware and verbalizes understanding of plan of care.  Vitals:   04/03/22 1015  BP: 118/78  Pulse: (!) 125  Resp: 18  Temp: (!) 103.1 F (39.5 C)  SpO2: 93%

## 2022-04-03 NOTE — ED Notes (Signed)
Report given to [facility] ACLS transport crew, appropriate documents including EMTALA sent with [facility].

## 2022-04-04 DIAGNOSIS — R918 Other nonspecific abnormal finding of lung field: Secondary | ICD-10-CM | POA: Diagnosis not present

## 2022-04-04 DIAGNOSIS — R509 Fever, unspecified: Secondary | ICD-10-CM | POA: Diagnosis not present

## 2022-04-04 DIAGNOSIS — D849 Immunodeficiency, unspecified: Secondary | ICD-10-CM | POA: Diagnosis not present

## 2022-04-04 DIAGNOSIS — Z940 Kidney transplant status: Secondary | ICD-10-CM | POA: Diagnosis not present

## 2022-04-04 DIAGNOSIS — R9389 Abnormal findings on diagnostic imaging of other specified body structures: Secondary | ICD-10-CM | POA: Diagnosis not present

## 2022-04-04 DIAGNOSIS — R519 Headache, unspecified: Secondary | ICD-10-CM | POA: Diagnosis not present

## 2022-04-05 DIAGNOSIS — R509 Fever, unspecified: Secondary | ICD-10-CM | POA: Diagnosis not present

## 2022-04-05 DIAGNOSIS — Z940 Kidney transplant status: Secondary | ICD-10-CM | POA: Diagnosis not present

## 2022-04-05 DIAGNOSIS — D849 Immunodeficiency, unspecified: Secondary | ICD-10-CM | POA: Diagnosis not present

## 2022-04-05 LAB — URINE CULTURE: Culture: 50000 — AB

## 2022-04-06 ENCOUNTER — Telehealth: Payer: Self-pay

## 2022-04-06 DIAGNOSIS — R509 Fever, unspecified: Secondary | ICD-10-CM | POA: Diagnosis not present

## 2022-04-06 DIAGNOSIS — D849 Immunodeficiency, unspecified: Secondary | ICD-10-CM | POA: Diagnosis not present

## 2022-04-06 DIAGNOSIS — Z940 Kidney transplant status: Secondary | ICD-10-CM | POA: Diagnosis not present

## 2022-04-06 NOTE — Telephone Encounter (Signed)
Post ED Visit - Positive Culture Follow-up  Culture report reviewed by antimicrobial stewardship pharmacist: [place] Team [x]  Mal Misty Cook, [place].D. []  Heide Guile, Pharm.D., BCPS AQ-ID []  Parks Neptune, Pharm.D., BCPS []  Alycia Rossetti, Pharm.D., BCPS []  [place], Pharm.D., BCPS, AAHIVP []  Legrand Como, Pharm.D., BCPS, AAHIVP []  Salome Arnt, PharmD, BCPS []  Johnnette Gourd, PharmD, BCPS []  Hughes Better, PharmD, BCPS []  Leeroy Cha, PharmD []  Laqueta Linden, PharmD, BCPS []  Albertina Parr, PharmD  [place] Team []  Leodis Sias, PharmD []  Lindell Spar, PharmD []  Royetta Asal, PharmD []  Graylin Shiver, Rph []  Rema Fendt) Glennon Mac, PharmD []  Arlyn Dunning, PharmD []  Netta Cedars, PharmD []  Dia Sitter, PharmD []  Leone Haven, PharmD []  Gretta Arab, PharmD []  Theodis Shove, PharmD []  Peggyann Juba, PharmD []  Reuel Boom, PharmD   Positive urine culture Pt transferred to [facility] inpatient on broad spectrum abx. Plan - defer further tx to [facility] team  Glennon Hamilton 04/06/2022, 10:40 AM

## 2022-04-07 DIAGNOSIS — R509 Fever, unspecified: Secondary | ICD-10-CM | POA: Diagnosis not present

## 2022-04-07 DIAGNOSIS — Z940 Kidney transplant status: Secondary | ICD-10-CM | POA: Diagnosis not present

## 2022-04-07 DIAGNOSIS — R9389 Abnormal findings on diagnostic imaging of other specified body structures: Secondary | ICD-10-CM | POA: Diagnosis not present

## 2022-04-07 DIAGNOSIS — D849 Immunodeficiency, unspecified: Secondary | ICD-10-CM | POA: Diagnosis not present

## 2022-04-08 DIAGNOSIS — D849 Immunodeficiency, unspecified: Secondary | ICD-10-CM | POA: Diagnosis not present

## 2022-04-08 DIAGNOSIS — R509 Fever, unspecified: Secondary | ICD-10-CM | POA: Diagnosis not present

## 2022-04-08 DIAGNOSIS — Z940 Kidney transplant status: Secondary | ICD-10-CM | POA: Diagnosis not present

## 2022-04-08 LAB — CULTURE, BLOOD (ROUTINE X 2)
Culture: NO GROWTH
Special Requests: ADEQUATE

## 2022-04-15 DIAGNOSIS — Z940 Kidney transplant status: Secondary | ICD-10-CM | POA: Diagnosis not present

## 2022-04-15 DIAGNOSIS — D849 Immunodeficiency, unspecified: Secondary | ICD-10-CM | POA: Diagnosis not present

## 2022-04-27 DIAGNOSIS — D849 Immunodeficiency, unspecified: Secondary | ICD-10-CM | POA: Diagnosis not present

## 2022-04-27 DIAGNOSIS — Z79899 Other long term (current) drug therapy: Secondary | ICD-10-CM | POA: Diagnosis not present

## 2022-04-27 DIAGNOSIS — Z4821 Encounter for aftercare following heart transplant: Secondary | ICD-10-CM | POA: Diagnosis not present

## 2022-04-27 DIAGNOSIS — D84821 Immunodeficiency due to drugs: Secondary | ICD-10-CM | POA: Diagnosis not present

## 2022-04-27 DIAGNOSIS — N186 End stage renal disease: Secondary | ICD-10-CM | POA: Diagnosis not present

## 2022-04-27 DIAGNOSIS — Z23 Encounter for immunization: Secondary | ICD-10-CM | POA: Diagnosis not present

## 2022-04-27 DIAGNOSIS — Z940 Kidney transplant status: Secondary | ICD-10-CM | POA: Diagnosis not present

## 2022-04-28 DIAGNOSIS — E559 Vitamin D deficiency, unspecified: Secondary | ICD-10-CM | POA: Diagnosis not present

## 2022-04-28 DIAGNOSIS — Z940 Kidney transplant status: Secondary | ICD-10-CM | POA: Diagnosis not present

## 2022-04-28 DIAGNOSIS — Z9484 Stem cells transplant status: Secondary | ICD-10-CM | POA: Diagnosis not present

## 2022-04-28 DIAGNOSIS — B259 Cytomegaloviral disease, unspecified: Secondary | ICD-10-CM | POA: Diagnosis not present

## 2022-04-28 DIAGNOSIS — Z1159 Encounter for screening for other viral diseases: Secondary | ICD-10-CM | POA: Diagnosis not present

## 2022-04-28 DIAGNOSIS — Z9483 Pancreas transplant status: Secondary | ICD-10-CM | POA: Diagnosis not present

## 2022-04-28 DIAGNOSIS — Z5181 Encounter for therapeutic drug level monitoring: Secondary | ICD-10-CM | POA: Diagnosis not present

## 2022-05-24 DIAGNOSIS — Z9483 Pancreas transplant status: Secondary | ICD-10-CM | POA: Diagnosis not present

## 2022-05-24 DIAGNOSIS — Z940 Kidney transplant status: Secondary | ICD-10-CM | POA: Diagnosis not present

## 2022-05-24 DIAGNOSIS — Z5181 Encounter for therapeutic drug level monitoring: Secondary | ICD-10-CM | POA: Diagnosis not present

## 2022-05-24 DIAGNOSIS — Z9484 Stem cells transplant status: Secondary | ICD-10-CM | POA: Diagnosis not present

## 2022-05-24 DIAGNOSIS — Z1159 Encounter for screening for other viral diseases: Secondary | ICD-10-CM | POA: Diagnosis not present

## 2022-05-24 DIAGNOSIS — B259 Cytomegaloviral disease, unspecified: Secondary | ICD-10-CM | POA: Diagnosis not present

## 2022-05-28 DIAGNOSIS — R9389 Abnormal findings on diagnostic imaging of other specified body structures: Secondary | ICD-10-CM | POA: Diagnosis not present

## 2022-05-28 DIAGNOSIS — Z940 Kidney transplant status: Secondary | ICD-10-CM | POA: Diagnosis not present

## 2022-05-28 DIAGNOSIS — Z79899 Other long term (current) drug therapy: Secondary | ICD-10-CM | POA: Diagnosis not present

## 2022-05-28 DIAGNOSIS — G4733 Obstructive sleep apnea (adult) (pediatric): Secondary | ICD-10-CM | POA: Diagnosis not present

## 2022-05-28 DIAGNOSIS — Z4822 Encounter for aftercare following kidney transplant: Secondary | ICD-10-CM | POA: Diagnosis not present

## 2022-05-28 DIAGNOSIS — D84821 Immunodeficiency due to drugs: Secondary | ICD-10-CM | POA: Diagnosis not present

## 2022-05-28 DIAGNOSIS — Z7952 Long term (current) use of systemic steroids: Secondary | ICD-10-CM | POA: Diagnosis not present

## 2022-05-28 DIAGNOSIS — M109 Gout, unspecified: Secondary | ICD-10-CM | POA: Diagnosis not present

## 2022-05-28 DIAGNOSIS — D849 Immunodeficiency, unspecified: Secondary | ICD-10-CM | POA: Diagnosis not present

## 2022-05-28 DIAGNOSIS — Z7982 Long term (current) use of aspirin: Secondary | ICD-10-CM | POA: Diagnosis not present

## 2022-07-09 DIAGNOSIS — Z940 Kidney transplant status: Secondary | ICD-10-CM | POA: Diagnosis not present

## 2022-07-09 DIAGNOSIS — D849 Immunodeficiency, unspecified: Secondary | ICD-10-CM | POA: Diagnosis not present

## 2022-08-09 DIAGNOSIS — E7800 Pure hypercholesterolemia, unspecified: Secondary | ICD-10-CM | POA: Diagnosis not present

## 2022-08-09 DIAGNOSIS — E039 Hypothyroidism, unspecified: Secondary | ICD-10-CM | POA: Diagnosis not present

## 2022-08-09 DIAGNOSIS — Z23 Encounter for immunization: Secondary | ICD-10-CM | POA: Diagnosis not present

## 2022-08-09 DIAGNOSIS — I129 Hypertensive chronic kidney disease with stage 1 through stage 4 chronic kidney disease, or unspecified chronic kidney disease: Secondary | ICD-10-CM | POA: Diagnosis not present

## 2022-08-09 DIAGNOSIS — Z0000 Encounter for general adult medical examination without abnormal findings: Secondary | ICD-10-CM | POA: Diagnosis not present

## 2022-08-09 DIAGNOSIS — Z940 Kidney transplant status: Secondary | ICD-10-CM | POA: Diagnosis not present

## 2022-08-09 DIAGNOSIS — I10 Essential (primary) hypertension: Secondary | ICD-10-CM | POA: Diagnosis not present

## 2022-08-09 DIAGNOSIS — K5190 Ulcerative colitis, unspecified, without complications: Secondary | ICD-10-CM | POA: Diagnosis not present

## 2022-08-09 DIAGNOSIS — M109 Gout, unspecified: Secondary | ICD-10-CM | POA: Diagnosis not present

## 2022-08-12 DIAGNOSIS — T8619 Other complication of kidney transplant: Secondary | ICD-10-CM | POA: Diagnosis not present

## 2022-08-12 DIAGNOSIS — D849 Immunodeficiency, unspecified: Secondary | ICD-10-CM | POA: Diagnosis not present

## 2022-08-12 DIAGNOSIS — Z940 Kidney transplant status: Secondary | ICD-10-CM | POA: Diagnosis not present

## 2022-08-12 DIAGNOSIS — N2889 Other specified disorders of kidney and ureter: Secondary | ICD-10-CM | POA: Diagnosis not present

## 2022-08-31 DIAGNOSIS — Z940 Kidney transplant status: Secondary | ICD-10-CM | POA: Diagnosis not present

## 2022-08-31 DIAGNOSIS — Z9189 Other specified personal risk factors, not elsewhere classified: Secondary | ICD-10-CM | POA: Diagnosis not present

## 2022-08-31 DIAGNOSIS — Z8619 Personal history of other infectious and parasitic diseases: Secondary | ICD-10-CM | POA: Diagnosis not present

## 2022-08-31 DIAGNOSIS — T888XXD Other specified complications of surgical and medical care, not elsewhere classified, subsequent encounter: Secondary | ICD-10-CM | POA: Diagnosis not present

## 2022-08-31 DIAGNOSIS — I10 Essential (primary) hypertension: Secondary | ICD-10-CM | POA: Diagnosis not present

## 2022-08-31 DIAGNOSIS — D849 Immunodeficiency, unspecified: Secondary | ICD-10-CM | POA: Diagnosis not present

## 2022-08-31 DIAGNOSIS — K51919 Ulcerative colitis, unspecified with unspecified complications: Secondary | ICD-10-CM | POA: Diagnosis not present

## 2022-09-23 DIAGNOSIS — Z940 Kidney transplant status: Secondary | ICD-10-CM | POA: Diagnosis not present

## 2022-09-23 DIAGNOSIS — D849 Immunodeficiency, unspecified: Secondary | ICD-10-CM | POA: Diagnosis not present

## 2022-12-01 DIAGNOSIS — R600 Localized edema: Secondary | ICD-10-CM | POA: Diagnosis not present

## 2022-12-01 DIAGNOSIS — E213 Hyperparathyroidism, unspecified: Secondary | ICD-10-CM | POA: Diagnosis not present

## 2022-12-01 DIAGNOSIS — I10 Essential (primary) hypertension: Secondary | ICD-10-CM | POA: Diagnosis not present

## 2022-12-01 DIAGNOSIS — D849 Immunodeficiency, unspecified: Secondary | ICD-10-CM | POA: Diagnosis not present

## 2022-12-01 DIAGNOSIS — K5190 Ulcerative colitis, unspecified, without complications: Secondary | ICD-10-CM | POA: Diagnosis not present

## 2022-12-01 DIAGNOSIS — D84821 Immunodeficiency due to drugs: Secondary | ICD-10-CM | POA: Diagnosis not present

## 2022-12-01 DIAGNOSIS — Z4822 Encounter for aftercare following kidney transplant: Secondary | ICD-10-CM | POA: Diagnosis not present

## 2022-12-01 DIAGNOSIS — Z79899 Other long term (current) drug therapy: Secondary | ICD-10-CM | POA: Diagnosis not present

## 2022-12-01 DIAGNOSIS — Z940 Kidney transplant status: Secondary | ICD-10-CM | POA: Diagnosis not present

## 2022-12-01 DIAGNOSIS — G4733 Obstructive sleep apnea (adult) (pediatric): Secondary | ICD-10-CM | POA: Diagnosis not present

## 2023-02-02 DIAGNOSIS — N186 End stage renal disease: Secondary | ICD-10-CM | POA: Diagnosis not present

## 2023-02-02 DIAGNOSIS — K600 Acute anal fissure: Secondary | ICD-10-CM | POA: Diagnosis not present

## 2023-02-02 DIAGNOSIS — K5190 Ulcerative colitis, unspecified, without complications: Secondary | ICD-10-CM | POA: Diagnosis not present

## 2023-02-17 DIAGNOSIS — E7800 Pure hypercholesterolemia, unspecified: Secondary | ICD-10-CM | POA: Diagnosis not present

## 2023-02-17 DIAGNOSIS — D84821 Immunodeficiency due to drugs: Secondary | ICD-10-CM | POA: Diagnosis not present

## 2023-02-17 DIAGNOSIS — I10 Essential (primary) hypertension: Secondary | ICD-10-CM | POA: Diagnosis not present

## 2023-02-17 DIAGNOSIS — K5190 Ulcerative colitis, unspecified, without complications: Secondary | ICD-10-CM | POA: Diagnosis not present

## 2023-02-17 DIAGNOSIS — Z4822 Encounter for aftercare following kidney transplant: Secondary | ICD-10-CM | POA: Diagnosis not present

## 2023-02-17 DIAGNOSIS — Z940 Kidney transplant status: Secondary | ICD-10-CM | POA: Diagnosis not present

## 2023-02-17 DIAGNOSIS — Z8619 Personal history of other infectious and parasitic diseases: Secondary | ICD-10-CM | POA: Diagnosis not present

## 2023-02-17 DIAGNOSIS — D849 Immunodeficiency, unspecified: Secondary | ICD-10-CM | POA: Diagnosis not present

## 2023-02-17 DIAGNOSIS — E213 Hyperparathyroidism, unspecified: Secondary | ICD-10-CM | POA: Diagnosis not present

## 2023-02-25 DIAGNOSIS — D225 Melanocytic nevi of trunk: Secondary | ICD-10-CM | POA: Diagnosis not present

## 2023-02-25 DIAGNOSIS — L739 Follicular disorder, unspecified: Secondary | ICD-10-CM | POA: Diagnosis not present

## 2023-02-25 DIAGNOSIS — D2270 Melanocytic nevi of unspecified lower limb, including hip: Secondary | ICD-10-CM | POA: Diagnosis not present

## 2023-02-25 DIAGNOSIS — D2260 Melanocytic nevi of unspecified upper limb, including shoulder: Secondary | ICD-10-CM | POA: Diagnosis not present

## 2023-02-25 DIAGNOSIS — D239 Other benign neoplasm of skin, unspecified: Secondary | ICD-10-CM | POA: Diagnosis not present

## 2023-02-25 DIAGNOSIS — D849 Immunodeficiency, unspecified: Secondary | ICD-10-CM | POA: Diagnosis not present

## 2023-03-18 DIAGNOSIS — Z940 Kidney transplant status: Secondary | ICD-10-CM | POA: Diagnosis not present

## 2023-03-18 DIAGNOSIS — D849 Immunodeficiency, unspecified: Secondary | ICD-10-CM | POA: Diagnosis not present

## 2023-04-14 DIAGNOSIS — K5730 Diverticulosis of large intestine without perforation or abscess without bleeding: Secondary | ICD-10-CM | POA: Diagnosis not present

## 2023-04-14 DIAGNOSIS — K5190 Ulcerative colitis, unspecified, without complications: Secondary | ICD-10-CM | POA: Diagnosis not present

## 2023-04-14 DIAGNOSIS — K6389 Other specified diseases of intestine: Secondary | ICD-10-CM | POA: Diagnosis not present

## 2023-04-14 DIAGNOSIS — Z1211 Encounter for screening for malignant neoplasm of colon: Secondary | ICD-10-CM | POA: Diagnosis not present

## 2023-04-14 DIAGNOSIS — K5100 Ulcerative (chronic) pancolitis without complications: Secondary | ICD-10-CM | POA: Diagnosis not present

## 2023-04-14 DIAGNOSIS — K635 Polyp of colon: Secondary | ICD-10-CM | POA: Diagnosis not present

## 2023-04-14 DIAGNOSIS — K6289 Other specified diseases of anus and rectum: Secondary | ICD-10-CM | POA: Diagnosis not present

## 2023-06-21 DIAGNOSIS — D849 Immunodeficiency, unspecified: Secondary | ICD-10-CM | POA: Diagnosis not present

## 2023-06-21 DIAGNOSIS — D84821 Immunodeficiency due to drugs: Secondary | ICD-10-CM | POA: Diagnosis not present

## 2023-06-21 DIAGNOSIS — T8619 Other complication of kidney transplant: Secondary | ICD-10-CM | POA: Diagnosis not present

## 2023-06-21 DIAGNOSIS — Z940 Kidney transplant status: Secondary | ICD-10-CM | POA: Diagnosis not present

## 2023-06-21 DIAGNOSIS — I10 Essential (primary) hypertension: Secondary | ICD-10-CM | POA: Diagnosis not present

## 2023-07-27 NOTE — Progress Notes (Deleted)
Date:  07/27/2023   ID:  Colin [name], DOB April 12, 1966, MRN [id number]  Patient Location:  [address] [place] [address]   Provider location:   [facility], [place] office  PCP:  Blair Heys, MD  Cardiologist:  Julien Nordmann, MD   Chief Complaint  Patient presents with   Shortness of Breath    Pt asking for stress test , transplant list for kidney     History of Present Illness:    Colin [name] is a 57 y.o. male past medical history of hypertension,  stage IV kidney disease,attributed to systemic hypertension , diagnosis of focal segmental glomerulosclerosis AV fistula, for HD ulcerative colitis dating back to 2000 on Humira  Chronic leg edema Echo normal in 01/2014 awaiting kidney transplant at [facility] Obstructive sleep apnea on CPAP Who presents for leg edema, HTN, prekidney transplant eval  LOV 07/2021  Followed at the [facility] transplant list for kidney, listed since April 2020, seen on annual basis  Echocardiogram at outside facility April 23, 2021  ejection fraction greater than 55% NORMAL LEFT VENTRICULAR SYSTOLIC FUNCTION WITH MILD LVH  NORMAL RIGHT VENTRICULAR SYSTOLIC FUNCTION  VALVULAR REGURGITATION: TRIVIAL MR, MILD PR, MILD TR  NO VALVULAR STENOSIS  ESTIMATED RVSP OF AT LEAST 43 mmHg  DILATED MPA (3.3 cm) WITH MILD PR   lexiscan myoview 06/2020 that showed normal wall motion, EF 53%, low risk, small region of ischemia in the distal anterior wall, apical attenuation correction.   Echo in August 2021 at outside office showed  EF 55-60%, G1DD, mod concentric LVH, no significant valvular disease  testosterone was low , now being treated Covid 02/2019  home dialysis, started Nov 2021, HD 4 days a week  No regular exercise program, sleeping in the daytime Sleeping issues as detailed below  Sleep study at [place] On CPAP, started at [place], started Jan 2022 Comes off face  Recent lab work reviewed July  2022 Hemoglobin 9.8, creatinine 19, BUN 89 Total cholesterol in the past 178  EKG personally reviewed by myself on todays visit Normal sinus rhythm  Past medical history reviewed CT scan January 2020 showing mild atherosclerosis in the proximal left common iliac vessel Otherwise relatively clean distal aorta and iliac, femoral artery vessels   Prior CV studies:   The following studies were reviewed today:  Echocardiogram April 2015 ejection fraction 55 to 60%  Past Medical History:  Diagnosis Date   Anemia    iron deficiency   Arthritis    Gout   CKD (chronic kidney disease) stage 3, GFR 30-59 ml/min (HCC)    Stage 4   Gout    History of chicken pox    Hypertension    Melanosis coli    Mumps    Peripheral neuropathy    Pneumonia    Ulcerative colitis 2000   [place], [place]   Past Surgical History:  Procedure Laterality Date   AV FISTULA PLACEMENT Right 08/27/2016   Procedure: RIGHT RADIOCEPHALIC ARTERIOVENOUS FISTULA CREATION;  Surgeon: Larina Earthly, MD;  Location: [facility] OR;  Service: Vascular;  Laterality: Right;   AV FISTULA PLACEMENT Right 10/21/2017   Procedure: ARTERIOVENOUS (AV) FISTULA CREATION RIGHT ARM;  Surgeon: Larina Earthly, MD;  [facility] OR;  Service: Vascular;  Laterality: Right;   COLONOSCOPY     FISTULA SUPERFICIALIZATION Right 11/12/2016   Procedure: FISTULA SUPERFICIALIZATION RIGHT FOREEARM;  Surgeon: Larina Earthly, MD;  Location: [facility] OR;  Service: Vascular;  Laterality: Right;   kidney tranplant Right    REVISON OF ARTERIOVENOUS FISTULA Right 12/23/2017   Procedure: CEPHALIC VEIN TRANSPOSITION ARTERIOVENOUS FISTULA RIGHT UPPER ARM;  Surgeon: Larina Earthly, MD;  [facility]: [facility] OR;  Service: Vascular;  Laterality: Right;   TENDON REPAIR  1987   Left index finger     No outpatient medications have been marked as taking for the 07/28/23 encounter (Appointment) with Antonieta Iba, MD.    Current Outpatient Medications on File Prior to Visit   Medication Sig Dispense Refill   allopurinol (ZYLOPRIM) 100 MG tablet Take 100 mg by mouth daily.     amitriptyline (ELAVIL) 25 MG tablet Take 75 mg by mouth at bedtime.     calcitRIOL (ROCALTROL) 0.25 MCG capsule Take 0.25 mcg by mouth daily.     cinacalcet (SENSIPAR) 30 MG tablet Take 30 mg by mouth at bedtime.     ferrous sulfate 325 (65 FE) MG tablet Take 325 mg by mouth at bedtime.     HUMIRA PEN 40 MG/0.4ML PNKT Inject 40 mg/mL as directed See admin instructions. 40 mg/0.34mL injection every two weeks (Friday/Saturday)     LOKELMA 10 g PACK packet Take 1 packet by mouth at bedtime.     Methoxy PEG-Epoetin Beta (MIRCERA IJ) Inject into the skin. Dialysis Med     multivitamin (RENA-VIT) TABS tablet Take 1 tablet by mouth daily.     PEPCID 20 MG tablet Take 20 mg by mouth every other day.      sevelamer carbonate (RENVELA) 800 MG tablet Take 3,200 mg by mouth See admin instructions. With meals and snacks (Patient not taking: Reported on 07/20/2021)     traZODone (DESYREL) 100 MG tablet Take 100 mg by mouth at bedtime as needed for sleep.     No current facility-administered medications on file prior to visit.     Allergies:   Sulfa antibiotics   Social History   Tobacco Use   Smoking status: Never   Smokeless tobacco: Former    Types: Engineer, drilling   Vaping status: Never Used  Substance Use Topics   Alcohol use: Yes    Comment: rare   Drug use: No     Family Hx: The patient's family history includes Heart disease in his father; Hypertension in his father and mother; Renal Disease in his father.  ROS:   Please see the history of present illness.    Review of Systems  Constitutional: Negative.   Respiratory: Negative.    Cardiovascular: Negative.   Gastrointestinal: Negative.   Musculoskeletal: Negative.   Neurological: Negative.   Psychiatric/Behavioral: Negative.    All other systems reviewed and are negative.    Labs/Other Tests and Data Reviewed:    Recent  Labs: No results found for requested labs within last 365 days.   Recent Lipid Panel No results found for: "CHOL", "TRIG", "HDL", "CHOLHDL", "LDLCALC", "LDLDIRECT"  Wt Readings from Last 3 Encounters:  07/20/21 244 lb 6.4 oz (110.9 kg)  04/24/21 230 lb (104.3 kg)  12/08/20 238 lb (108 kg)     Exam:    There were no vitals taken for this visit. Constitutional:  oriented to person, place, and time. No distress.  HENT:  Head: Grossly normal Eyes:  no discharge. No scleral icterus.  Neck: No JVD, no carotid bruits  Cardiovascular: Regular rate and rhythm, no murmurs appreciated Pulmonary/Chest: Clear to auscultation bilaterally, no wheezes or rails Abdominal: Soft.  no distension.  no tenderness.  Musculoskeletal:  Normal range of motion Neurological:  normal muscle tone. Coordination normal. No atrophy Skin: Skin warm and dry Psychiatric: normal affect, pleasant   ASSESSMENT & PLAN:    1. Morbid obesity (HCC) We have encouraged continued exercise, careful diet management in an effort to lose weight.  2. ESRD (end stage renal disease) (HCC) Followed by nephrology, on transplant list at [facility] Echo done at the [place] [facility] in 2021, at [facility] in 2022, images unavailable for review  By report normal ejection fraction, LVH  no significant valve disease He is requesting repeat Lexiscan Myoview for transplant listing at [facility], this will be ordered Recommend he talk with his team at [facility], consider cardiac CTA which would not need to be repeated on an annual basis  3. Essential hypertension Prior history orthostasis with his dialysis Volume status managed by nephrology  4. Localized edema Stable  5.  Chronic fatigue Concern for poor sleep hygiene, CPAP started January 2022,  Has a sleep doctor at the [place]  6.  Sinus tachycardia Etiology unclear, likely related to volume/hypovolemia, anemia Normal LV function on echo August 2021 Zio monitor previously ordered, was not completed  February 2022  7.  Chronic anemia Will defer management to nephrology, may need Procrit    Total encounter time more than 25 minutes  Greater than 50% was spent in counseling and coordination of care with the patient   Signed, Julien Nordmann, MD  07/27/2023 2:17 PM    [facility] [address], [place], [place] [address]

## 2023-07-28 ENCOUNTER — Ambulatory Visit: Payer: Medicare Other | Admitting: Cardiovascular Disease

## 2023-07-28 DIAGNOSIS — N186 End stage renal disease: Secondary | ICD-10-CM

## 2023-07-28 DIAGNOSIS — Z940 Kidney transplant status: Secondary | ICD-10-CM

## 2023-07-28 DIAGNOSIS — I10 Essential (primary) hypertension: Secondary | ICD-10-CM

## 2023-07-28 DIAGNOSIS — E782 Mixed hyperlipidemia: Secondary | ICD-10-CM

## 2023-08-21 NOTE — Progress Notes (Deleted)
NO SHOW

## 2023-08-22 ENCOUNTER — Ambulatory Visit: Payer: Medicare Other | Attending: Cardiovascular Disease | Admitting: Cardiovascular Disease

## 2023-08-22 DIAGNOSIS — E782 Mixed hyperlipidemia: Secondary | ICD-10-CM

## 2023-08-22 DIAGNOSIS — J9601 Acute respiratory failure with hypoxia: Secondary | ICD-10-CM

## 2023-08-22 DIAGNOSIS — N186 End stage renal disease: Secondary | ICD-10-CM

## 2023-08-22 DIAGNOSIS — I10 Essential (primary) hypertension: Secondary | ICD-10-CM

## 2023-09-06 DIAGNOSIS — D849 Immunodeficiency, unspecified: Secondary | ICD-10-CM | POA: Diagnosis not present

## 2023-09-06 DIAGNOSIS — T8619 Other complication of kidney transplant: Secondary | ICD-10-CM | POA: Diagnosis not present

## 2023-10-17 ENCOUNTER — Ambulatory Visit (HOSPITAL_COMMUNITY)
Admission: EM | Admit: 2023-10-17 | Discharge: 2023-10-17 | Disposition: A | Discharge status: Home / Self Care | Payer: Medicare Other | Attending: Internal Medicine | Admitting: Internal Medicine

## 2023-10-17 ENCOUNTER — Encounter (HOSPITAL_COMMUNITY): Payer: Self-pay

## 2023-10-17 DIAGNOSIS — U071 COVID-19: Secondary | ICD-10-CM

## 2023-10-17 LAB — POC COVID19/FLU A&B COMBO
Covid Antigen, POC: POSITIVE — AB
Influenza A Antigen, POC: NEGATIVE
Influenza B Antigen, POC: NEGATIVE

## 2023-10-17 MED ORDER — BENZONATATE 100 MG PO CAPS: 100.0000 mg | ORAL_CAPSULE | Freq: Three times a day (TID) | ORAL | 0 refills | Status: AC | PRN

## 2023-10-17 MED ORDER — MOLNUPIRAVIR 200 MG PO CAPS: 4.0000 | ORAL_CAPSULE | Freq: Two times a day (BID) | ORAL | 0 refills | Status: AC

## 2023-10-17 NOTE — ED Provider Notes (Addendum)
[facility]    CSN: [id number] Arrival date & time: 10/17/23  1632      History   Chief Complaint Chief Complaint  Patient presents with   Cough    HPI Colin [name] is a 57 y.o. male with a history of chronic kidney disease stage V status post renal transplant comes to urgent care with 1 day history of nasal congestion, nonproductive cough and runny nose.  Patient is fully vaccinated against COVID-19 and has had her flu vaccination this season.  He denies any fever or chills.  He has general malaise and feels a little off but denies any dizziness, near-syncope or syncopal episodes.  No sick contacts but he has been around a lot of people during this holidays.  No nausea, vomiting or diarrhea.  No shortness of breath or wheezing.  No chest pain or chest pressure.  HPI  Past Medical History:  Diagnosis Date   Anemia    iron deficiency   Arthritis    Gout   CKD (chronic kidney disease) stage 3, GFR 30-59 ml/min (HCC)    Stage 4   Gout    History of chicken pox    Hypertension    Melanosis coli    Mumps    Peripheral neuropathy    Pneumonia    Ulcerative colitis 2000   [place], [place]    Patient Active Problem List   Diagnosis Date Noted   Acute blood loss anemia 02/06/2021   Pneumonia due to COVID-19 virus 03/10/2019   Macrocytic anemia 03/10/2019   Acute hypoxemic respiratory failure (HCC)    Hyperprolactinemia (HCC) 01/17/2018   Screening for malignant neoplasm of prostate 01/17/2018   Gynecomastia 01/17/2018   Ingrown toenail 06/21/2017   Onychomycosis 06/21/2017   ESRD (end stage renal disease) (HCC) 02/07/2017   Morbid obesity (HCC) 02/07/2017   Bilateral leg edema 10/02/2015   Hyperlipidemia 10/02/2015   Edema 02/09/2011   Chronic renal insufficiency 02/09/2011   ANEMIA, IRON DEFICIENCY 10/16/2007   Essential hypertension 10/16/2007   Ulcerative colitis (HCC) 02/13/2007   MELANOSIS COLI 02/13/2007    Past Surgical History:  Procedure  Laterality Date   AV FISTULA PLACEMENT Right 08/27/2016   Procedure: RIGHT RADIOCEPHALIC ARTERIOVENOUS FISTULA CREATION;  Surgeon: Larina Earthly, MD;  Location: [facility] OR;  Service: Vascular;  Laterality: Right;   AV FISTULA PLACEMENT Right 10/21/2017   Procedure: ARTERIOVENOUS (AV) FISTULA CREATION RIGHT ARM;  Surgeon: Larina Earthly, MD;  [facility] OR;  Service: Vascular;  Laterality: Right;   COLONOSCOPY     FISTULA SUPERFICIALIZATION Right 11/12/2016   Procedure: FISTULA SUPERFICIALIZATION RIGHT FOREEARM;  Surgeon: Larina Earthly, MD;  Location: [facility] OR;  Service: Vascular;  Laterality: Right;   kidney tranplant Right    REVISON OF ARTERIOVENOUS FISTULA Right 12/23/2017   Procedure: CEPHALIC VEIN TRANSPOSITION ARTERIOVENOUS FISTULA RIGHT UPPER ARM;  Surgeon: Larina Earthly, MD;  Location[facility] OR;  Service: Vascular;  Laterality: Right;   TENDON REPAIR  1987   Left index finger       Home Medications    Prior to Admission medications   Medication Sig Start Date End Date Taking? Authorizing Provider  acetaminophen (TYLENOL) 325 MG tablet Take 650 mg by mouth every 6 (six) hours as needed. 08/31/21  Yes [provider]  atorvastatin (LIPITOR) 20 MG tablet Take 20 mg by mouth daily. 02/23/23  Yes [provider]  B Complex Vitamins (VITAMIN B COMPLEX) TABS Take 1 tablet by mouth daily. 02/24/23  02/24/24 Yes [provider]  benzonatate (TESSALON) 100 MG capsule Take 1 capsule (100 mg total) by mouth 3 (three) times daily as needed for cough. 10/17/23  Yes Windel Keziah, Britta Mccreedy, MD  carvedilol (COREG) 25 MG tablet Take 0.5 tablets by mouth 2 (two) times daily. 02/22/23  Yes [provider]  Cholecalciferol 50 MCG (2000 UT) TABS Take 1 tablet by mouth daily. 02/22/23  Yes [provider]  furosemide (LASIX) 40 MG tablet Take 40 mg by mouth as needed for edema. 02/22/23  Yes [provider]  gabapentin (NEURONTIN) 100 MG capsule Take 100 mg by mouth 3 (three)  times daily. 10/23/21  Yes [provider]  molnupiravir EUA (LAGEVRIO) 200 MG CAPS capsule Take 4 capsules (800 mg total) by mouth 2 (two) times daily for 5 days. 10/17/23 10/22/23 Yes Emylee Decelle, Britta Mccreedy, MD  mycophenolate (CELLCEPT) 250 MG capsule Take 2 capsules by mouth 2 (two) times daily. 11/16/22  Yes [provider]  predniSONE (DELTASONE) 5 MG tablet Take 1 tablet by mouth daily. 10/30/21  Yes [provider]  promethazine (PHENERGAN) 25 MG tablet Take 25 mg by mouth 2 (two) times daily as needed for nausea. 05/12/23  Yes [provider]  Semaglutide-Weight Management 1.7 MG/0.75ML SOAJ Inject 1.7 mg into the skin. 10/02/23  Yes [provider]  senna-docusate (SENOKOT-S) 8.6-50 MG tablet Take 1 tablet by mouth 2 (two) times daily as needed. 06/21/23  Yes [provider]  SUMAtriptan (IMITREX) 25 MG tablet Take 25 mg by mouth every 2 (two) hours as needed for migraine. 05/12/23  Yes [provider]  tacrolimus (PROGRAF) 1 MG capsule Take 2 capsules by mouth 2 (two) times daily. 08/31/22  Yes [provider]  testosterone cypionate (DEPOTESTOSTERONE CYPIONATE) 200 MG/ML injection Inject 200 mg into the muscle every 28 (twenty-eight) days. 12/09/22  Yes [provider]  KLOR-CON M20 20 MEQ tablet Take 20 mEq by mouth 2 (two) times daily.    [provider]  PEPCID 20 MG tablet Take 20 mg by mouth every other day.  06/06/20   [provider]  sucroferric oxyhydroxide (VELPHORO) 500 MG chewable tablet Chew 500 mg by mouth.    [provider]  traZODone (DESYREL) 100 MG tablet Take 100 mg by mouth at bedtime as needed for sleep. 02/25/21   [provider]    Family History Family History  Problem Relation Age of Onset   Hypertension Father    Heart disease Father    Renal Disease Father    Hypertension Mother     Social History Social History   Tobacco Use   Smoking status: Never    Smokeless tobacco: Former    Types: Associate Professor status: Never Used  Substance Use Topics   Alcohol use: Yes    Comment: rare   Drug use: No     Allergies   Sulfa antibiotics   Review of Systems Review of Systems As per HPI  Physical Exam Triage Vital Signs ED Triage Vitals  Encounter Vitals Group     BP 10/17/23 1849 130/87     Systolic BP Percentile --      Diastolic BP Percentile --      Pulse Rate 10/17/23 1849 90     Resp 10/17/23 1849 16     Temp 10/17/23 1849 98.8 F (37.1 C)     Temp Source 10/17/23 1849 Oral     SpO2 10/17/23 1849 96 %  Weight 10/17/23 1849 205 lb (93 kg)     Height 10/17/23 1849 5\' 5"  (1.651 m)     Head Circumference --      Peak Flow --      Pain Score 10/17/23 1846 5     Pain Loc --      Pain Education --      Exclude from Growth Chart --    No data found.  Updated Vital Signs BP 130/87 (BP Location: Left Arm)   Pulse 90   Temp 98.8 F (37.1 C) (Oral)   Resp 16   Ht 5\' 5"  (1.651 m)   Wt 93 kg   SpO2 96%   BMI 34.11 kg/m   Visual Acuity Right Eye Distance:   Left Eye Distance:   Bilateral Distance:    Right Eye Near:   Left Eye Near:    Bilateral Near:     Physical Exam Vitals and nursing note reviewed.  Constitutional:      General: He is not in acute distress.    Appearance: He is ill-appearing.  HENT:     Right Ear: Tympanic membrane normal.     Left Ear: Tympanic membrane normal.     Nose: Congestion present.     Mouth/Throat:     Mouth: Mucous membranes are moist.     Pharynx: No oropharyngeal exudate or posterior oropharyngeal erythema.  Eyes:     Extraocular Movements: Extraocular movements intact.     Pupils: Pupils are equal, round, and reactive to light.  Cardiovascular:     Rate and Rhythm: Normal rate and regular rhythm.     Pulses: Normal pulses.     Heart sounds: Normal heart sounds.  Pulmonary:     Effort: Pulmonary effort is normal.     Breath sounds: Normal breath sounds.   Abdominal:     General: Bowel sounds are normal.     Palpations: Abdomen is soft.  Neurological:     Mental Status: He is alert.      UC Treatments / Results  Labs (all labs ordered are listed, but only abnormal results are displayed) Labs Reviewed  POC COVID19/FLU A&B COMBO - Abnormal; Notable for the following components:      Result Value   Covid Antigen, POC Positive (*)    All other components within normal limits    EKG   Radiology No results found.  Procedures Procedures (including critical care time)  Medications Ordered in UC Medications - No data to display  Initial Impression / Assessment and Plan / UC Course  I have reviewed the triage vital signs and the nursing notes.  Pertinent labs & imaging results that were available during my care of the patient were reviewed by me and considered in my medical decision making (see chart for details).     1.  COVID-19 infection: Molnupiravir Patient is on tacrolimus hence Paxlovid is contraindicated Tylenol as needed for pain and/or fever Increase oral fluid intake Tessalon Perles as needed for cough Return precautions given.  Final Clinical Impressions(s) / UC Diagnoses   Final diagnoses:  COVID-19 virus infection     Discharge Instructions      Please increase oral fluid intake Humidifier and VapoRub use will help with cough and nasal congestion. Your COVID test was positive Please take medications as prescribed You may take Tylenol  as needed for pain and/or fever Please feel free to return to urgent care if symptoms worsen.    ED Prescriptions  Medication Sig Dispense Auth. Provider   molnupiravir EUA (LAGEVRIO) 200 MG CAPS capsule Take 4 capsules (800 mg total) by mouth 2 (two) times daily for 5 days. 40 capsule Marabeth Melland, Britta Mccreedy, MD   benzonatate (TESSALON) 100 MG capsule Take 1 capsule (100 mg total) by mouth 3 (three) times daily as needed for cough. 21 capsule Shatora Weatherbee, Britta Mccreedy, MD       PDMP not reviewed this encounter.  10/17/2023 at 8:30 PM: I called the patient and advised him against taking ibuprofen.  I initially wrote that on his AVS and I called him to inform him that he cannot take ibuprofen because of his renal transplant.   Merrilee Jansky, MD 10/17/23 2020    Merrilee Jansky, MD 10/17/23 2022

## 2023-10-17 NOTE — ED Triage Notes (Signed)
Patient here today with c/o cough, ST, PND, nasal congestion, and headache since Saturday. He has tried taking Robitussin with no relief yet. Covid tested at home was positive.

## 2023-10-17 NOTE — Discharge Instructions (Addendum)
Please increase oral fluid intake Humidifier and VapoRub use will help with cough and nasal congestion. Your COVID test was positive Please take medications as prescribed You may take Tylenol  as needed for pain and/or fever Please feel free to return to urgent care if symptoms worsen.

## 2023-10-25 DIAGNOSIS — Z940 Kidney transplant status: Secondary | ICD-10-CM | POA: Diagnosis not present

## 2023-10-25 DIAGNOSIS — U071 COVID-19: Secondary | ICD-10-CM | POA: Diagnosis not present

## 2023-10-25 DIAGNOSIS — Z79899 Other long term (current) drug therapy: Secondary | ICD-10-CM | POA: Diagnosis not present

## 2023-10-25 DIAGNOSIS — I10 Essential (primary) hypertension: Secondary | ICD-10-CM | POA: Diagnosis not present

## 2023-11-01 ENCOUNTER — Ambulatory Visit (HOSPITAL_COMMUNITY)
Admission: EM | Admit: 2023-11-01 | Discharge: 2023-11-01 | Disposition: A | Discharge status: Home / Self Care | Payer: Medicare Other | Attending: Family Medicine | Admitting: Family Medicine

## 2023-11-01 ENCOUNTER — Encounter (HOSPITAL_COMMUNITY): Payer: Self-pay | Admitting: Emergency Medicine

## 2023-11-01 DIAGNOSIS — M25572 Pain in left ankle and joints of left foot: Secondary | ICD-10-CM

## 2023-11-01 DIAGNOSIS — M109 Gout, unspecified: Secondary | ICD-10-CM | POA: Diagnosis not present

## 2023-11-01 DIAGNOSIS — G8929 Other chronic pain: Secondary | ICD-10-CM

## 2023-11-01 MED ORDER — COLCHICINE 0.6 MG PO TABS: 0.6000 mg | ORAL_TABLET | Freq: Every day | ORAL | 0 refills | Status: AC

## 2023-11-01 MED ORDER — PREDNISONE 50 MG PO TABS: 50.0000 mg | ORAL_TABLET | Freq: Every day | ORAL | 0 refills | Status: AC

## 2023-11-01 MED ORDER — HYDROCODONE-ACETAMINOPHEN 5-325 MG PO TABS: 1.0000 | ORAL_TABLET | Freq: Four times a day (QID) | ORAL | 0 refills | Status: AC | PRN

## 2023-11-01 NOTE — ED Provider Notes (Signed)
 [facility]    CSN: [id number] Arrival date & time: 11/01/23  0801      History   Chief Complaint Chief Complaint  Patient presents with   Foot Pain   Ankle Pain    HPI Colin [name] is a 58 y.o. male.    Foot Pain  Ankle Pain  He has had left ankle pain for about a month.  No known injury.  He does have arthritis in the ankle from an old injury.  The pain went from the ankle to the big toe.  The toe is swollen, very painful.  He thinks it is a gout flare up.  His last flare was about 5 years ago, no longer on medications for that.  This morning having pain at both the ankle and the toe.   He did have a kidney transplant 2 years ago. \      Past Medical History:  Diagnosis Date   Anemia    iron deficiency   Arthritis    Gout   CKD (chronic kidney disease) stage 3, GFR 30-59 ml/min (HCC)    Stage 4   Gout    History of chicken pox    Hypertension    Melanosis coli    Mumps    Peripheral neuropathy    Pneumonia    Ulcerative colitis 2000   [place], [place]    Patient Active Problem List   Diagnosis Date Noted   Acute blood loss anemia 02/06/2021   Pneumonia due to COVID-19 virus 03/10/2019   Macrocytic anemia 03/10/2019   Acute hypoxemic respiratory failure (HCC)    Hyperprolactinemia (HCC) 01/17/2018   Screening for malignant neoplasm of prostate 01/17/2018   Gynecomastia 01/17/2018   Ingrown toenail 06/21/2017   Onychomycosis 06/21/2017   ESRD (end stage renal disease) (HCC) 02/07/2017   Morbid obesity (HCC) 02/07/2017   Bilateral leg edema 10/02/2015   Hyperlipidemia 10/02/2015   Edema 02/09/2011   Chronic renal insufficiency 02/09/2011   ANEMIA, IRON DEFICIENCY 10/16/2007   Essential hypertension 10/16/2007   Ulcerative colitis (HCC) 02/13/2007   MELANOSIS COLI 02/13/2007    Past Surgical History:  Procedure Laterality Date   AV FISTULA PLACEMENT Right 08/27/2016   Procedure: RIGHT RADIOCEPHALIC ARTERIOVENOUS FISTULA  CREATION;  Surgeon: Krystal JULIANNA Doing, MD;  Location: [facility] OR;  Service: Vascular;  Laterality: Right;   AV FISTULA PLACEMENT Right 10/21/2017   Procedure: ARTERIOVENOUS (AV) FISTULA CREATION RIGHT ARM;  Surgeon: Doing Krystal JULIANNA, MD;  Location: [facility] OR;  Service: Vascular;  Laterality: Right;   COLONOSCOPY     FISTULA SUPERFICIALIZATION Right 11/12/2016   Procedure: FISTULA SUPERFICIALIZATION RIGHT FOREEARM;  Surgeon: Krystal JULIANNA Doing, MD;  Location: [facility] OR;  Service: Vascular;  Laterality: Right;   kidney tranplant Right    REVISON OF ARTERIOVENOUS FISTULA Right 12/23/2017   Procedure: CEPHALIC VEIN TRANSPOSITION ARTERIOVENOUS FISTULA RIGHT UPPER ARM;  Surgeon: Doing Krystal JULIANNA, MD;  Location: [facility] OR;  Service: Vascular;  Laterality: Right;   TENDON REPAIR  1987   Left index finger       Home Medications    Prior to Admission medications   Medication Sig Start Date End Date Taking? Authorizing Provider  acetaminophen  (TYLENOL ) 325 MG tablet Take 650 mg by mouth every 6 (six) hours as needed. 08/31/21   [provider]  atorvastatin (LIPITOR) 20 MG tablet Take 20 mg by mouth daily. 02/23/23   [provider]  B Complex Vitamins (VITAMIN B COMPLEX) TABS Take 1 tablet by  mouth daily. 02/24/23 02/24/24  [provider]  benzonatate  (TESSALON ) 100 MG capsule Take 1 capsule (100 mg total) by mouth 3 (three) times daily as needed for cough. 10/17/23   Blaise Aleene KIDD, MD  carvedilol (COREG) 25 MG tablet Take 0.5 tablets by mouth 2 (two) times daily. 02/22/23   [provider]  Cholecalciferol 50 MCG (2000 UT) TABS Take 1 tablet by mouth daily. 02/22/23   [provider]  furosemide (LASIX) 40 MG tablet Take 40 mg by mouth as needed for edema. 02/22/23   [provider]  gabapentin (NEURONTIN) 100 MG capsule Take 100 mg by mouth 3 (three) times daily. 10/23/21   [provider]  KLOR-CON M20 20 MEQ tablet Take 20 mEq by mouth 2 (two) times daily.    [provider]  mycophenolate (CELLCEPT) 250 MG capsule Take 2 capsules by mouth 2 (two) times daily. 11/16/22   [provider]  PEPCID  20 MG tablet Take 20 mg by mouth every other day.  06/06/20   [provider]  predniSONE  (DELTASONE ) 5 MG tablet Take 1 tablet by mouth daily. 10/30/21   [provider]  promethazine  (PHENERGAN ) 25 MG tablet Take 25 mg by mouth 2 (two) times daily as needed for nausea. 05/12/23   [provider]  Semaglutide-Weight Management 1.7 MG/0.75ML SOAJ Inject 1.7 mg into the skin. 10/02/23   [provider]  senna-docusate (SENOKOT-S) 8.6-50 MG tablet Take 1 tablet by mouth 2 (two) times daily as needed. 06/21/23   [provider]  sucroferric oxyhydroxide (VELPHORO) 500 MG chewable tablet Chew 500 mg by mouth.    [provider]  SUMAtriptan (IMITREX) 25 MG tablet Take 25 mg by mouth every 2 (two) hours as needed for migraine. 05/12/23   [provider]  tacrolimus  (PROGRAF ) 1 MG capsule Take 2 capsules by mouth 2 (two) times daily. 08/31/22   [provider]  testosterone  cypionate (DEPOTESTOSTERONE CYPIONATE) 200 MG/ML injection Inject 200 mg into the muscle every 28 (twenty-eight) days. 12/09/22   [provider]  traZODone (DESYREL) 100 MG tablet Take 100 mg by mouth at bedtime as needed for sleep. 02/25/21   [provider]    Family History Family History  Problem Relation Age of Onset   Hypertension Father    Heart disease Father    Renal Disease Father    Hypertension Mother     Social History Social History   Tobacco Use   Smoking status: Never   Smokeless tobacco: Former    Types: Associate Professor status: Never Used  Substance Use Topics   Alcohol use: Yes    Comment: rare   Drug use: No     Allergies   Sulfa antibiotics   Review of Systems Review of Systems  Constitutional: Negative.   HENT: Negative.    Respiratory: Negative.     Cardiovascular: Negative.   Gastrointestinal: Negative.   Genitourinary: Negative.   Musculoskeletal:  Positive for joint swelling.     Physical Exam Triage Vital Signs ED Triage Vitals  Encounter Vitals Group     BP 11/01/23 0826 131/84     Systolic BP Percentile --      Diastolic BP Percentile --      Pulse Rate 11/01/23 0826 90     Resp 11/01/23 0826 17     Temp 11/01/23 0826 98 F (36.7 C)     Temp Source 11/01/23 0826 Oral  SpO2 11/01/23 0826 97 %     Weight --      Height --      Head Circumference --      Peak Flow --      Pain Score 11/01/23 0825 9     Pain Loc --      Pain Education --      Exclude from Growth Chart --    No data found.  Updated Vital Signs BP 131/84 (BP Location: Left Arm)   Pulse 90   Temp 98 F (36.7 C) (Oral)   Resp 17   SpO2 97%   Visual Acuity Right Eye Distance:   Left Eye Distance:   Bilateral Distance:    Right Eye Near:   Left Eye Near:    Bilateral Near:     Physical Exam Constitutional:      Appearance: Normal appearance.  Cardiovascular:     Rate and Rhythm: Normal rate and regular rhythm.  Pulmonary:     Effort: Pulmonary effort is normal.     Breath sounds: Normal breath sounds.  Musculoskeletal:     Comments: Slight TTP to the left medial ankle;  full rom without limitation;  He has TTP to the left 1st toe joint;  the toe is swollen;  no redness/warmth noted;   Neurological:     General: No focal deficit present.     Mental Status: He is alert.  Psychiatric:        Mood and Affect: Mood normal.      UC Treatments / Results  Labs (all labs ordered are listed, but only abnormal results are displayed) Labs Reviewed - No data to display  EKG   Radiology No results found.  Procedures Procedures (including critical care time)  Medications Ordered in UC Medications - No data to display  Initial Impression / Assessment and Plan / UC Course  I have reviewed the triage vital signs and the  nursing notes.  Pertinent labs & imaging results that were available during my care of the patient were reviewed by me and considered in my medical decision making (see chart for details).   Patient is here for gout.  Given he his s/p renal transplant, will avoid nsaids, and give norco for pain.  He will check with his renal transplant team as well.   Final Clinical Impressions(s) / UC Diagnoses   Final diagnoses:  Chronic pain of left ankle  Gout of big toe     Discharge Instructions      You were seen today for ankle pain and gout.  Please follow up with PT in regards to the ankle pain.  I have sent out a steroid pack, colchicine , and hydrocodone  for your gout. Please check with your transplant nurse to make sure these are okay before starting.  Follow up if not improving.     ED Prescriptions     Medication Sig Dispense Auth. Provider   predniSONE  (DELTASONE ) 50 MG tablet Take 1 tablet (50 mg total) by mouth daily for 5 days. 5 tablet Jeannette Maddy, MD   colchicine  0.6 MG tablet Take 1 tablet (0.6 mg total) by mouth daily. 5 tablet Ayrianna Mcginniss, MD   HYDROcodone -acetaminophen  (NORCO/VICODIN) 5-325 MG tablet Take 1-2 tablets by mouth every 6 (six) hours as needed for up to 10 days. 10 tablet Darral Longs, MD      PDMP not reviewed this encounter.   Darral Longs, MD 11/01/23 [phone]

## 2023-11-01 NOTE — ED Triage Notes (Signed)
 Left ankle been bothering patient for month and half. Since Sunday pt having pain and swelling in left big toe. Pt believes another gout flare up. Pt using crutches today as unable to bear weight

## 2023-11-01 NOTE — Discharge Instructions (Addendum)
 You were seen today for ankle pain and gout.  Please follow up with PT in regards to the ankle pain.  I have sent out a steroid pack, colchicine , and hydrocodone  for your gout. Please check with your transplant nurse to make sure these are okay before starting.  Follow up if not improving.

## 2023-11-25 DIAGNOSIS — N269 Renal sclerosis, unspecified: Secondary | ICD-10-CM | POA: Diagnosis not present

## 2023-11-25 DIAGNOSIS — Z4822 Encounter for aftercare following kidney transplant: Secondary | ICD-10-CM | POA: Diagnosis not present

## 2023-11-25 DIAGNOSIS — N261 Atrophy of kidney (terminal): Secondary | ICD-10-CM | POA: Diagnosis not present

## 2023-11-25 DIAGNOSIS — R7989 Other specified abnormal findings of blood chemistry: Secondary | ICD-10-CM | POA: Diagnosis not present

## 2023-11-25 DIAGNOSIS — T8619 Other complication of kidney transplant: Secondary | ICD-10-CM | POA: Diagnosis not present

## 2023-11-25 DIAGNOSIS — Z5181 Encounter for therapeutic drug level monitoring: Secondary | ICD-10-CM | POA: Diagnosis not present

## 2023-11-25 DIAGNOSIS — Z940 Kidney transplant status: Secondary | ICD-10-CM | POA: Diagnosis not present

## 2023-12-21 DIAGNOSIS — T50905A Adverse effect of unspecified drugs, medicaments and biological substances, initial encounter: Secondary | ICD-10-CM | POA: Diagnosis not present

## 2023-12-21 DIAGNOSIS — L658 Other specified nonscarring hair loss: Secondary | ICD-10-CM | POA: Diagnosis not present

## 2024-01-24 DIAGNOSIS — D84821 Immunodeficiency due to drugs: Secondary | ICD-10-CM | POA: Diagnosis not present

## 2024-01-24 DIAGNOSIS — T8619 Other complication of kidney transplant: Secondary | ICD-10-CM | POA: Diagnosis not present

## 2024-01-24 DIAGNOSIS — Z79899 Other long term (current) drug therapy: Secondary | ICD-10-CM | POA: Diagnosis not present

## 2024-01-24 DIAGNOSIS — Z23 Encounter for immunization: Secondary | ICD-10-CM | POA: Diagnosis not present

## 2024-01-24 DIAGNOSIS — D849 Immunodeficiency, unspecified: Secondary | ICD-10-CM | POA: Diagnosis not present

## 2024-01-24 DIAGNOSIS — Z79621 Long term (current) use of calcineurin inhibitor: Secondary | ICD-10-CM | POA: Diagnosis not present

## 2024-01-24 DIAGNOSIS — I10 Essential (primary) hypertension: Secondary | ICD-10-CM | POA: Diagnosis not present

## 2024-01-24 DIAGNOSIS — E7800 Pure hypercholesterolemia, unspecified: Secondary | ICD-10-CM | POA: Diagnosis not present

## 2024-01-24 DIAGNOSIS — E213 Hyperparathyroidism, unspecified: Secondary | ICD-10-CM | POA: Diagnosis not present

## 2024-01-24 DIAGNOSIS — Z79624 Long term (current) use of inhibitors of nucleotide synthesis: Secondary | ICD-10-CM | POA: Diagnosis not present

## 2024-01-24 DIAGNOSIS — Z940 Kidney transplant status: Secondary | ICD-10-CM | POA: Diagnosis not present

## 2024-01-24 DIAGNOSIS — M109 Gout, unspecified: Secondary | ICD-10-CM | POA: Diagnosis not present

## 2024-04-16 DIAGNOSIS — T8619 Other complication of kidney transplant: Secondary | ICD-10-CM | POA: Diagnosis not present

## 2024-04-16 DIAGNOSIS — D849 Immunodeficiency, unspecified: Secondary | ICD-10-CM | POA: Diagnosis not present

## 2024-05-25 DIAGNOSIS — D849 Immunodeficiency, unspecified: Secondary | ICD-10-CM | POA: Diagnosis not present

## 2024-05-25 DIAGNOSIS — T8619 Other complication of kidney transplant: Secondary | ICD-10-CM | POA: Diagnosis not present

## 2024-07-25 DIAGNOSIS — I10 Essential (primary) hypertension: Secondary | ICD-10-CM | POA: Diagnosis not present

## 2024-07-25 DIAGNOSIS — Z48298 Encounter for aftercare following other organ transplant: Secondary | ICD-10-CM | POA: Diagnosis not present

## 2024-07-25 DIAGNOSIS — T8619 Other complication of kidney transplant: Secondary | ICD-10-CM | POA: Diagnosis not present

## 2024-07-25 DIAGNOSIS — Z79621 Long term (current) use of calcineurin inhibitor: Secondary | ICD-10-CM | POA: Diagnosis not present

## 2024-07-25 DIAGNOSIS — Z23 Encounter for immunization: Secondary | ICD-10-CM | POA: Diagnosis not present

## 2024-07-25 DIAGNOSIS — Z4822 Encounter for aftercare following kidney transplant: Secondary | ICD-10-CM | POA: Diagnosis not present

## 2024-07-25 DIAGNOSIS — Z940 Kidney transplant status: Secondary | ICD-10-CM | POA: Diagnosis not present

## 2024-07-25 DIAGNOSIS — D849 Immunodeficiency, unspecified: Secondary | ICD-10-CM | POA: Diagnosis not present

## 2024-07-25 DIAGNOSIS — Z79624 Long term (current) use of inhibitors of nucleotide synthesis: Secondary | ICD-10-CM | POA: Diagnosis not present

## 2024-07-25 DIAGNOSIS — E876 Hypokalemia: Secondary | ICD-10-CM | POA: Diagnosis not present

## 2024-07-25 DIAGNOSIS — D84821 Immunodeficiency due to drugs: Secondary | ICD-10-CM | POA: Diagnosis not present

## 2024-07-30 DIAGNOSIS — Z940 Kidney transplant status: Secondary | ICD-10-CM | POA: Diagnosis not present

## 2024-07-30 DIAGNOSIS — T8619 Other complication of kidney transplant: Secondary | ICD-10-CM | POA: Diagnosis not present

## 2024-07-30 DIAGNOSIS — D849 Immunodeficiency, unspecified: Secondary | ICD-10-CM | POA: Diagnosis not present
